# Patient Record
Sex: Male | Born: 1975 | Race: White | Hispanic: No | Marital: Single | Smoking: Former smoker
Health system: Southern US, Community
[De-identification: ages and names within clinical notes are randomized; demographics above are authoritative.]

## PROBLEM LIST (undated history)

## (undated) DIAGNOSIS — R0981 Nasal congestion: Secondary | ICD-10-CM

## (undated) DIAGNOSIS — R768 Other specified abnormal immunological findings in serum: Secondary | ICD-10-CM

## (undated) DIAGNOSIS — F1210 Cannabis abuse, uncomplicated: Secondary | ICD-10-CM

## (undated) DIAGNOSIS — F32A Depression, unspecified: Secondary | ICD-10-CM

## (undated) DIAGNOSIS — F329 Major depressive disorder, single episode, unspecified: Secondary | ICD-10-CM

## (undated) DIAGNOSIS — B1920 Unspecified viral hepatitis C without hepatic coma: Secondary | ICD-10-CM

## (undated) DIAGNOSIS — F1910 Other psychoactive substance abuse, uncomplicated: Secondary | ICD-10-CM

## (undated) DIAGNOSIS — F1510 Other stimulant abuse, uncomplicated: Secondary | ICD-10-CM

## (undated) DIAGNOSIS — F319 Bipolar disorder, unspecified: Secondary | ICD-10-CM

## (undated) DIAGNOSIS — F419 Anxiety disorder, unspecified: Secondary | ICD-10-CM

## (undated) HISTORY — PX: ANKLE SURGERY: SHX546

## (undated) HISTORY — PX: TENDON REPAIR: SHX5111

## (undated) HISTORY — PX: HERNIA REPAIR: SHX51

---

## 2006-04-14 ENCOUNTER — Emergency Department: Payer: Self-pay | Admitting: Emergency Medicine

## 2006-05-27 ENCOUNTER — Emergency Department: Payer: Self-pay | Admitting: Internal Medicine

## 2007-12-31 ENCOUNTER — Emergency Department: Payer: Self-pay | Admitting: Emergency Medicine

## 2008-01-08 ENCOUNTER — Emergency Department: Payer: Self-pay | Admitting: Emergency Medicine

## 2008-01-19 ENCOUNTER — Emergency Department (HOSPITAL_COMMUNITY): Admission: EM | Admit: 2008-01-19 | Discharge: 2008-01-19 | Payer: Self-pay | Admitting: Emergency Medicine

## 2008-02-06 ENCOUNTER — Emergency Department (HOSPITAL_COMMUNITY): Admission: EM | Admit: 2008-02-06 | Discharge: 2008-02-06 | Payer: Self-pay | Admitting: Emergency Medicine

## 2008-11-17 IMAGING — CR DG HAND COMPLETE 3+V*R*
3 series · 3 of 3 positions shown · non-contrast
Comparison: 01/19/2008

CLINICAL DATA: Status post tendon and nerve repair due to fracture.
Limited something heavy.  Now cannot feel finger.  Pain.

RIGHT HAND - COMPLETE 3+ VIEW

[x hand pa right]
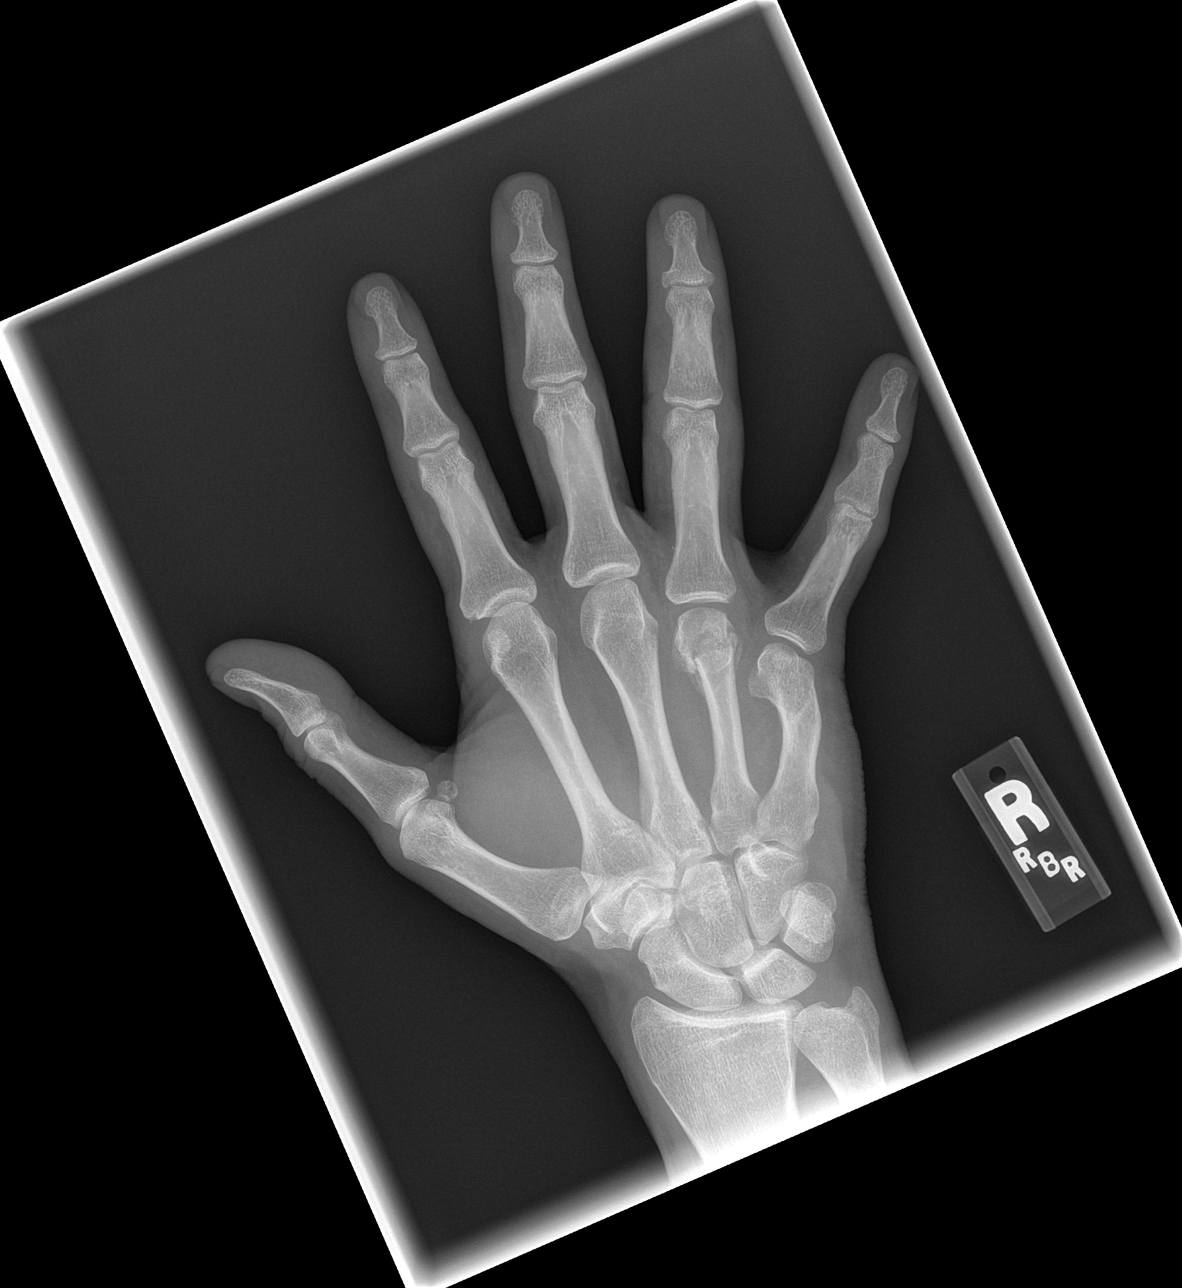

[x hand oblique right]
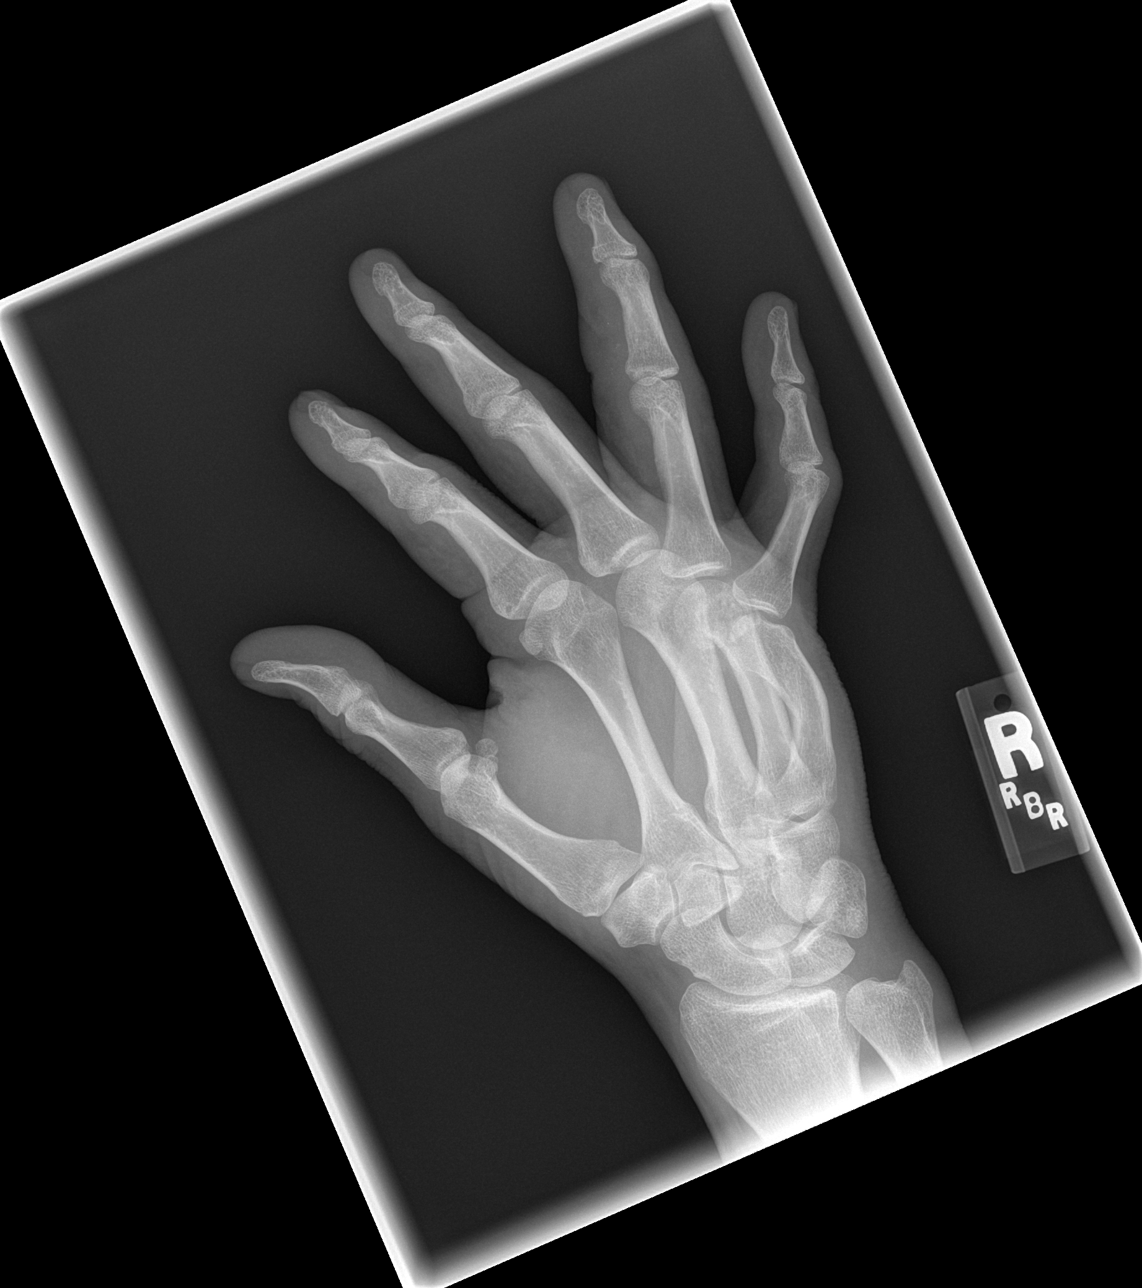

[x hand lat right]
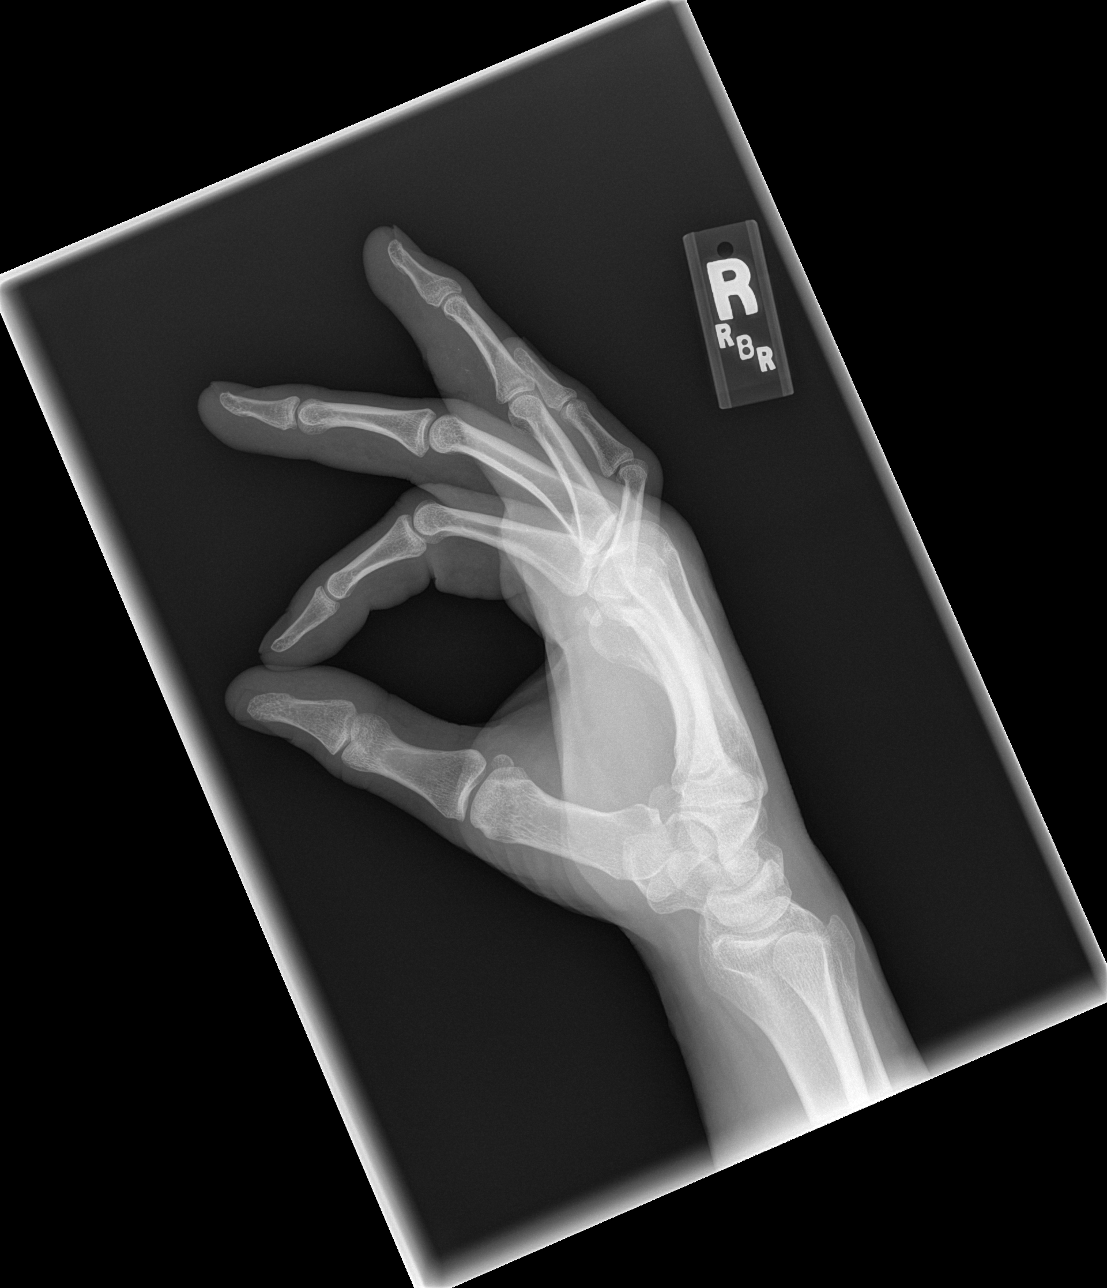

[3 of 3 positions shown; findings below may reference images not displayed]

FINDINGS: There is a healing fracture of the fourth metacarpal.
There has been no interval change in alignment.  Deformity of the
fifth metacarpal is consistent with old injury.  No new fractures
are identified.
IMPRESSION: No significant change in alignment of fourth metacarpal fracture.
Callus indicates interval healing.

## 2009-01-02 ENCOUNTER — Emergency Department: Payer: Self-pay | Admitting: Emergency Medicine

## 2009-11-07 ENCOUNTER — Emergency Department (HOSPITAL_COMMUNITY): Admission: EM | Admit: 2009-11-07 | Discharge: 2009-11-07 | Payer: Self-pay | Admitting: Emergency Medicine

## 2011-01-30 ENCOUNTER — Emergency Department (HOSPITAL_COMMUNITY)
Admission: EM | Admit: 2011-01-30 | Discharge: 2011-01-30 | Disposition: A | Discharge status: Home / Self Care | Payer: Self-pay | Attending: Emergency Medicine | Admitting: Emergency Medicine

## 2011-01-30 DIAGNOSIS — K029 Dental caries, unspecified: Secondary | ICD-10-CM | POA: Insufficient documentation

## 2011-01-30 DIAGNOSIS — R6889 Other general symptoms and signs: Secondary | ICD-10-CM | POA: Insufficient documentation

## 2011-01-30 DIAGNOSIS — J343 Hypertrophy of nasal turbinates: Secondary | ICD-10-CM | POA: Insufficient documentation

## 2011-01-30 DIAGNOSIS — R059 Cough, unspecified: Secondary | ICD-10-CM | POA: Insufficient documentation

## 2011-01-30 DIAGNOSIS — J069 Acute upper respiratory infection, unspecified: Secondary | ICD-10-CM | POA: Insufficient documentation

## 2011-01-30 DIAGNOSIS — K089 Disorder of teeth and supporting structures, unspecified: Secondary | ICD-10-CM | POA: Insufficient documentation

## 2011-01-30 DIAGNOSIS — R05 Cough: Secondary | ICD-10-CM | POA: Insufficient documentation

## 2011-01-30 DIAGNOSIS — J3489 Other specified disorders of nose and nasal sinuses: Secondary | ICD-10-CM | POA: Insufficient documentation

## 2011-03-03 ENCOUNTER — Emergency Department (HOSPITAL_COMMUNITY)
Admission: EM | Admit: 2011-03-03 | Discharge: 2011-03-03 | Disposition: A | Discharge status: Home / Self Care | Payer: Self-pay | Attending: Emergency Medicine | Admitting: Emergency Medicine

## 2011-03-03 DIAGNOSIS — K029 Dental caries, unspecified: Secondary | ICD-10-CM | POA: Insufficient documentation

## 2011-03-03 DIAGNOSIS — L2989 Other pruritus: Secondary | ICD-10-CM | POA: Insufficient documentation

## 2011-03-03 DIAGNOSIS — R300 Dysuria: Secondary | ICD-10-CM | POA: Insufficient documentation

## 2011-03-03 DIAGNOSIS — K089 Disorder of teeth and supporting structures, unspecified: Secondary | ICD-10-CM | POA: Insufficient documentation

## 2011-03-03 DIAGNOSIS — L298 Other pruritus: Secondary | ICD-10-CM | POA: Insufficient documentation

## 2011-03-16 ENCOUNTER — Emergency Department: Payer: Self-pay | Admitting: Emergency Medicine

## 2011-03-24 ENCOUNTER — Emergency Department: Payer: Self-pay | Admitting: Emergency Medicine

## 2011-05-12 ENCOUNTER — Emergency Department (HOSPITAL_COMMUNITY)
Admission: EM | Admit: 2011-05-12 | Discharge: 2011-05-12 | Disposition: A | Discharge status: Home / Self Care | Payer: Self-pay | Attending: Emergency Medicine | Admitting: Emergency Medicine

## 2011-05-12 DIAGNOSIS — M545 Low back pain, unspecified: Secondary | ICD-10-CM | POA: Insufficient documentation

## 2011-05-12 DIAGNOSIS — M542 Cervicalgia: Secondary | ICD-10-CM | POA: Insufficient documentation

## 2011-05-12 DIAGNOSIS — T148XXA Other injury of unspecified body region, initial encounter: Secondary | ICD-10-CM | POA: Insufficient documentation

## 2011-05-30 ENCOUNTER — Emergency Department: Payer: Self-pay | Admitting: Emergency Medicine

## 2011-06-19 ENCOUNTER — Emergency Department: Payer: Self-pay | Admitting: Emergency Medicine

## 2011-06-21 ENCOUNTER — Emergency Department: Payer: Self-pay | Admitting: Emergency Medicine

## 2011-10-01 ENCOUNTER — Encounter: Payer: Self-pay | Admitting: *Deleted

## 2011-10-01 ENCOUNTER — Emergency Department (HOSPITAL_COMMUNITY)
Admission: EM | Admit: 2011-10-01 | Discharge: 2011-10-01 | Disposition: A | Discharge status: Home / Self Care | Payer: Self-pay | Attending: Emergency Medicine | Admitting: Emergency Medicine

## 2011-10-01 ENCOUNTER — Emergency Department (HOSPITAL_COMMUNITY): Payer: Self-pay

## 2011-10-01 DIAGNOSIS — M25579 Pain in unspecified ankle and joints of unspecified foot: Secondary | ICD-10-CM | POA: Insufficient documentation

## 2011-10-01 DIAGNOSIS — W11XXXA Fall on and from ladder, initial encounter: Secondary | ICD-10-CM | POA: Insufficient documentation

## 2011-10-01 DIAGNOSIS — M79609 Pain in unspecified limb: Secondary | ICD-10-CM | POA: Insufficient documentation

## 2011-10-01 DIAGNOSIS — S6990XA Unspecified injury of unspecified wrist, hand and finger(s), initial encounter: Secondary | ICD-10-CM | POA: Insufficient documentation

## 2011-10-01 DIAGNOSIS — M79643 Pain in unspecified hand: Secondary | ICD-10-CM

## 2011-10-01 DIAGNOSIS — M7989 Other specified soft tissue disorders: Secondary | ICD-10-CM | POA: Insufficient documentation

## 2011-10-01 DIAGNOSIS — M25572 Pain in left ankle and joints of left foot: Secondary | ICD-10-CM

## 2011-10-01 MED ORDER — TRAMADOL HCL 50 MG PO TABS: 50.0000 mg | ORAL_TABLET | Freq: Four times a day (QID) | ORAL | Status: AC | PRN

## 2011-10-01 MED ORDER — MORPHINE SULFATE 2 MG/ML IJ SOLN
2.0000 mg | Freq: Once | INTRAMUSCULAR | Status: AC
  Administered 2011-10-01: 2 mg via INTRAMUSCULAR
  Filled 2011-10-01: qty 1

## 2011-10-01 MED ORDER — MORPHINE SULFATE 2 MG/ML IJ SOLN: 2.0000 mg | Freq: Once | INTRAMUSCULAR | Status: DC

## 2011-10-01 NOTE — ED Notes (Signed)
Pt reports he fell approx 6 feet off a ladder landing on his right side. Pt braced hisself with right hand. Pt with noted deformity to right hand. Pt denies any tenderness with palpation to c-spine or tls. Pt reports left ankle discomfort with tingling. Pt reports he is unable to flex or extend ankle. Pt denies hitting head.

## 2011-10-01 NOTE — ED Notes (Signed)
Pt fell off ladder today. Presents with swollen rt hand and left ankle. rom remains intact in affected extremities except pt is unable to actively flex rt foot/ankle. Also states numbness/tingling. Pt denies loc. Denies other injury.

## 2011-10-01 NOTE — ED Provider Notes (Signed)
History     CSN: [id number]  Arrival date & time 10/01/11  1418   First MD Initiated Contact with Patient 10/01/11 1716      Chief Complaint  Patient presents with  . Fall  . Hand Injury    (Consider location/radiation/quality/duration/timing/severity/associated sxs/prior treatment) Patient is a 35 y.o. male presenting with fall and hand injury. The history is provided by the patient.  Fall Pertinent negatives include no headaches.  Hand Injury    the patient is a 35 year old, male, who says that he fell off a ladder wall helping his mother.  Take down Christmas tree lights.  He struck his right hand and left ankle.  He complains of right hand pain and swelling over the mid carpal phalangeal joint of the long finger and index finger.  He also complains of left ankle pain.  He did not hit his head.  He has no pain anywhere else  History reviewed. No pertinent past medical history.  Past Surgical History  Procedure Date  . Tendon repair   . Ankle surgery   . Hernia repair     History reviewed. No pertinent family history.  History  Substance Use Topics  . Smoking status: Current Everyday Smoker  . Smokeless tobacco: Not on file  . Alcohol Use: No      Review of Systems  Musculoskeletal:       Right hand pain Left ankle pain  Neurological: Negative for headaches.  Hematological: Does not bruise/bleed easily.  Psychiatric/Behavioral: Negative for confusion.    Allergies  Review of patient's allergies indicates no known allergies.  Home Medications  No current outpatient prescriptions on file.  BP 134/81  Pulse 86  Temp(Src) 98.1 F (36.7 C) (Oral)  Resp 18  SpO2 97%  Physical Exam  Constitutional: He is oriented to person, place, and time. He appears well-developed and well-nourished. No distress.  HENT:  Head: Normocephalic and atraumatic.  Eyes: Conjunctivae are normal. Pupils are equal, round, and reactive to light.  Neck: Normal range of motion.    Musculoskeletal: He exhibits edema and tenderness.       Right hand Swelling and tenderness over the metacarpal phalangeal joints of the index and long fingers.  No deformities.  No ecchymoses.  No evidence of tendon injuries.  No lacerations Left ankle.  Mild tenderness to palpation over the medial and lateral malleoli.  No swelling.  No deformity.  No ecchymoses  Neurological: He is alert and oriented to person, place, and time.  Skin: Skin is warm and dry.  Psychiatric: He has a normal mood and affect.    ED Course  Procedures (including critical care time) 35 year old, male, with pain and tenderness in his right hand and left ankle after a fall off a ladder.  We will perform x-rays, and give analgesics.  Labs Reviewed - No data to display Dg Ankle Complete Left  10/01/2011  *RADIOLOGY REPORT*  Clinical Data: Fall.  Pain over the medial malleolus.  LEFT ANKLE COMPLETE - 3+ VIEW  Comparison: None.  Findings: The ankle joint is intact.  No acute bone or soft tissue abnormality is present.  No radiopaque foreign body is seen.  IMPRESSION: Negative left ankle.  Original Report Authenticated By: Jamesetta Orleans. MATTERN, M.D.   Dg Hand Complete Right  10/01/2011  *RADIOLOGY REPORT*  Clinical Data: 35 year old male with right hand pain following injury.  RIGHT HAND - COMPLETE 3+ VIEW  Comparison: 08/08/2011  Findings: There is no evidence of acute fracture, subluxation  or dislocation. There is no evidence of radiopaque foreign body. Remote fractures of the fourth and fifth metacarpals are again identified. No focal bony lesions are noted.  IMPRESSION: No acute abnormalities.  Original Report Authenticated By: Rosendo Gros, M.D.     No diagnosis found.    MDM  Right hand pain after fall.  No fracture or dislocation.  No lacerations. Left ankle pain after fall.  No fracture or dislocation.        Nicholes Stairs, MD 10/01/11 1755

## 2012-04-01 IMAGING — CR LEFT WRIST - COMPLETE 3+ VIEW
1 series · 4 of 4 positions shown · non-contrast
Comparison: none

REASON FOR EXAM: tramatic injury, pain
COMMENTS:   May transport without cardiac monitor

PROCEDURE:     DXR - DXR WRIST LT COMP WITH OBLIQUES  - June 21, 2011 [DATE]
RESULT:     There is no evidence of fracture, dislocation, or malalignment.

[Series 1: view not recorded · 0.17mm/px · 4 of 4 slices shown]
[im 1/4]
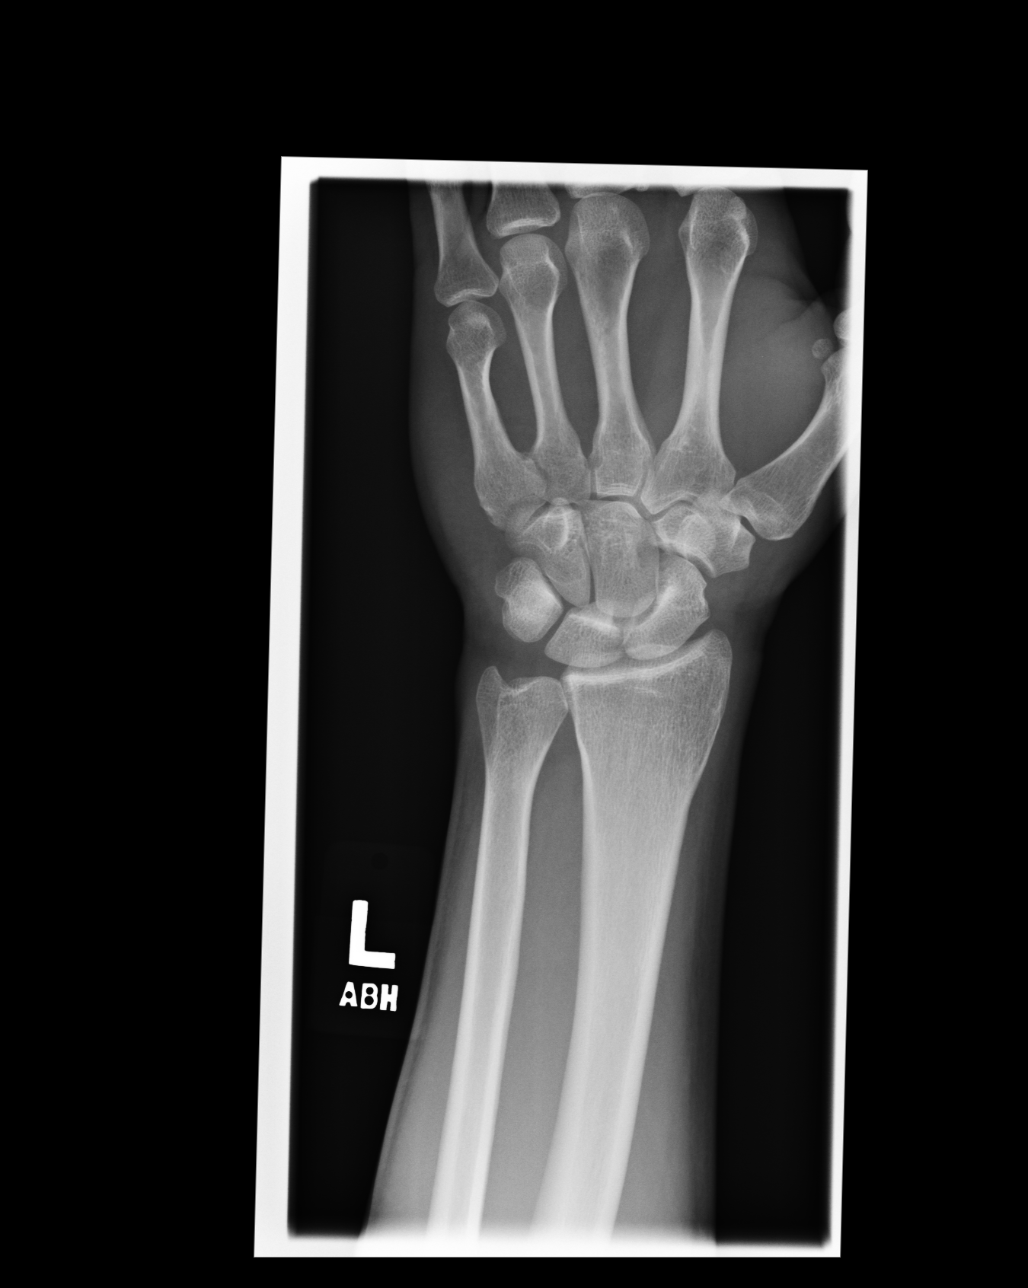
[im 2/4]
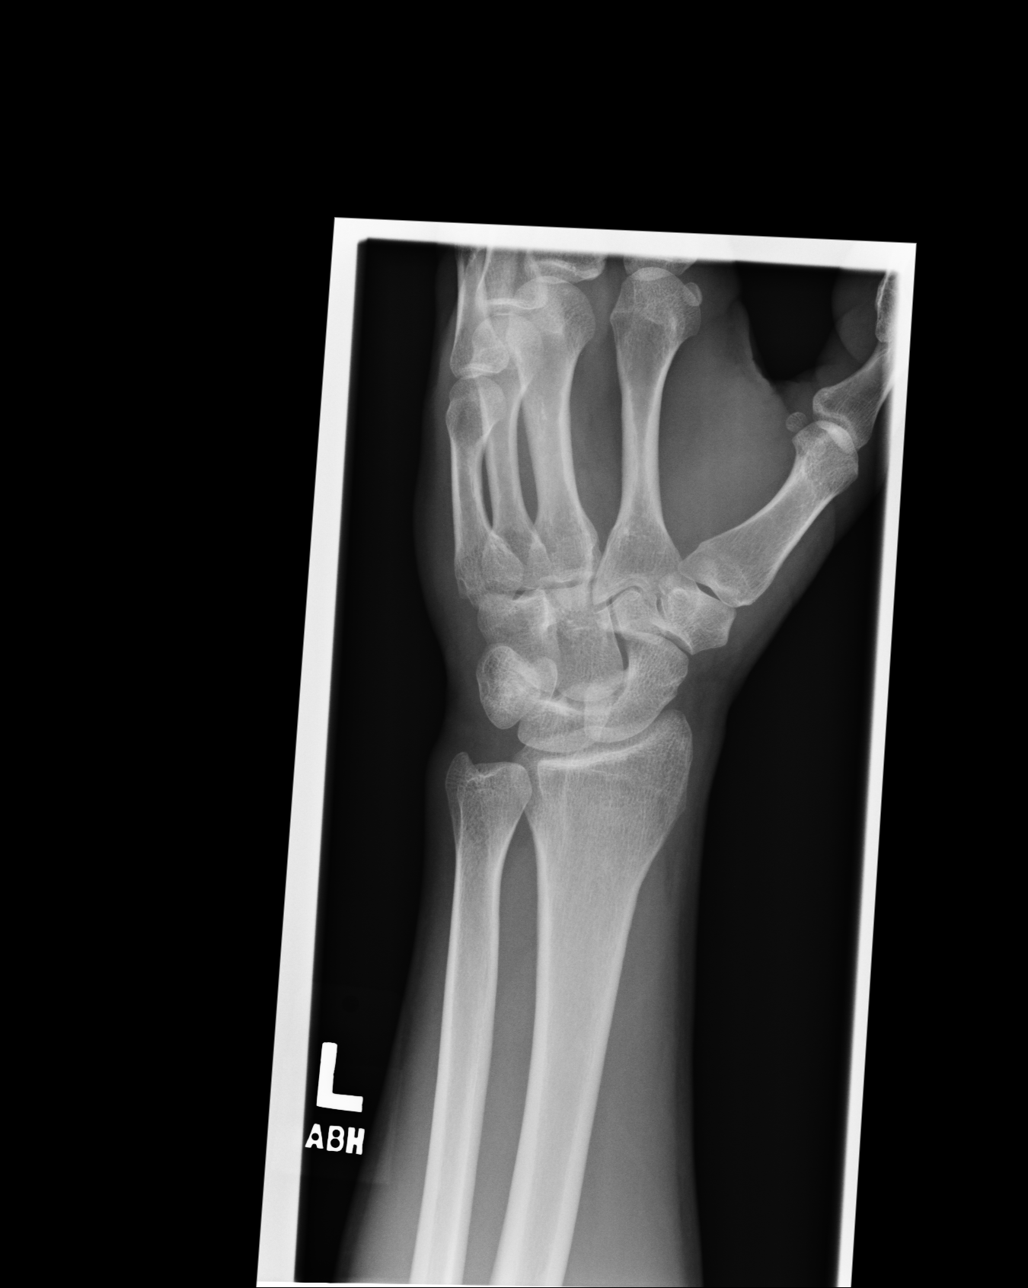
[im 3/4]
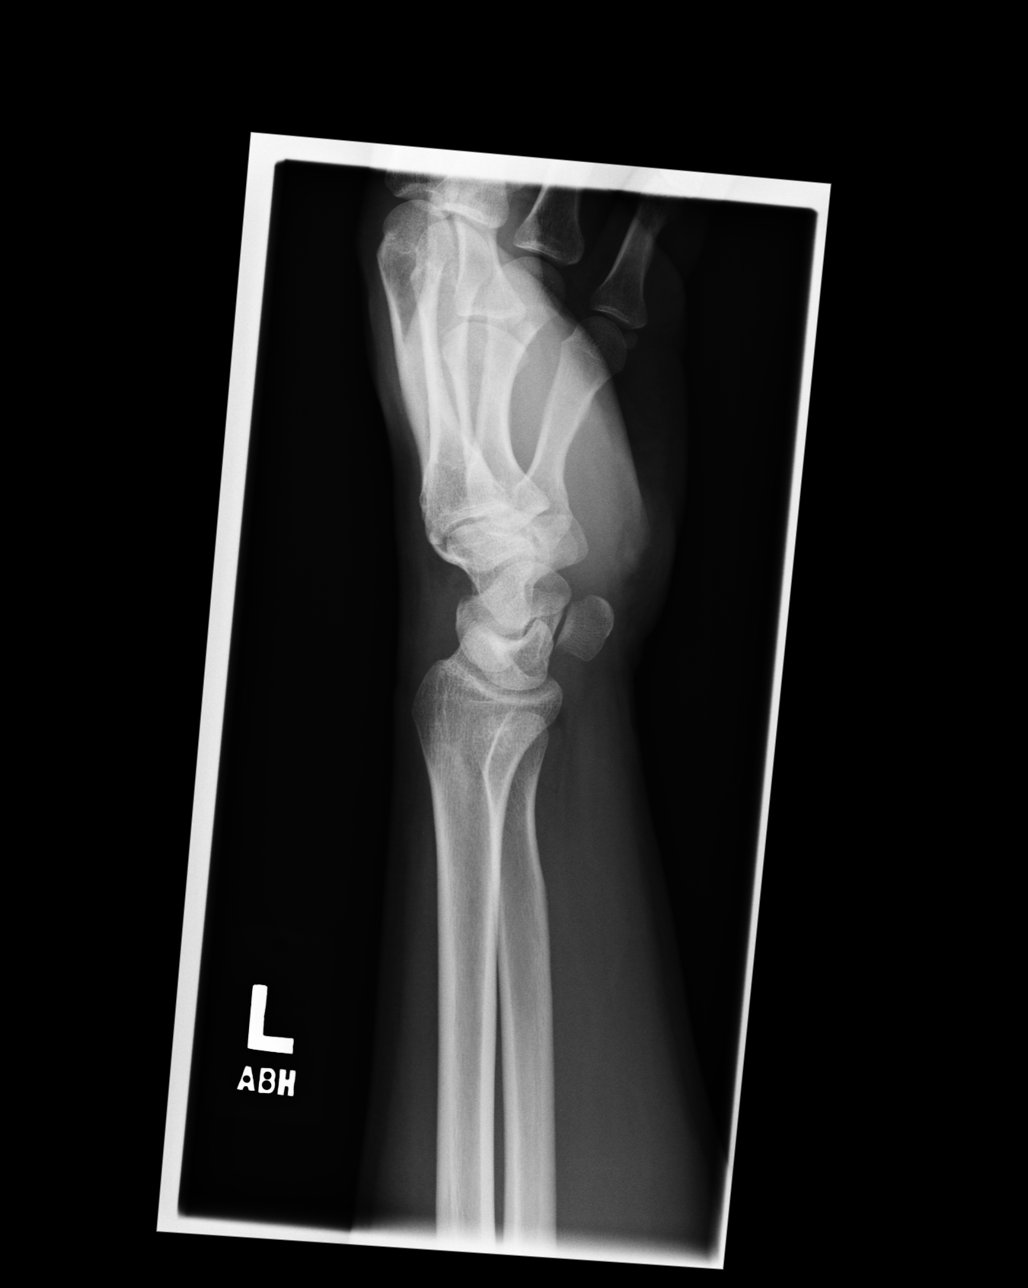
[im 4/4]
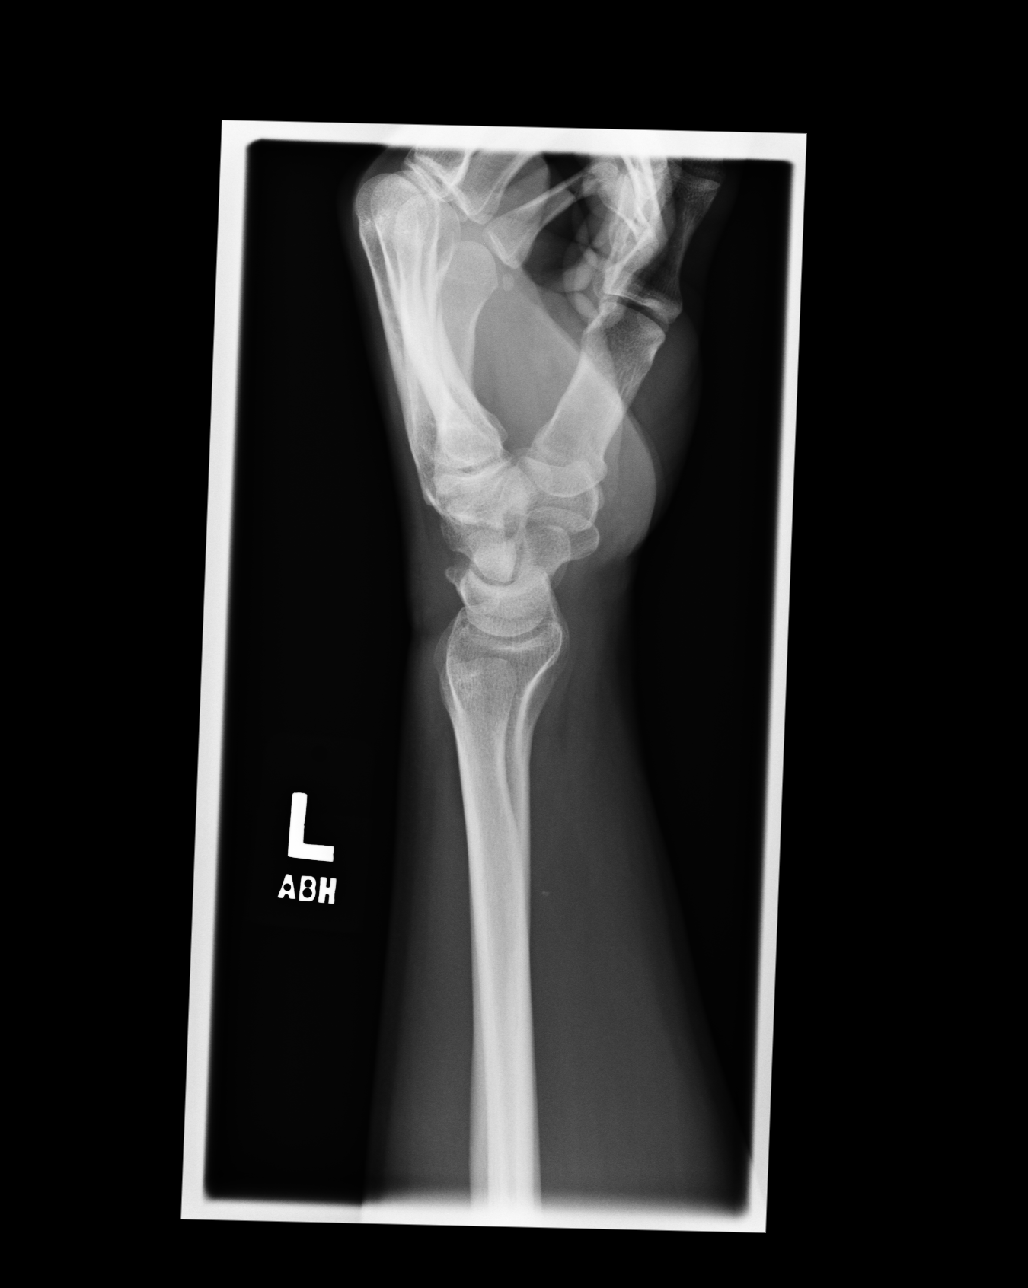

[4 of 4 positions shown; findings below may reference images not displayed]

IMPRESSION: 1. No evidence of acute abnormalities.
2. If there are persistent complaints of pain or persistent clinical
concern, a repeat evaluation in 7-10 days is recommended if clinically
warranted.

## 2012-06-08 ENCOUNTER — Emergency Department (HOSPITAL_COMMUNITY)
Admission: EM | Admit: 2012-06-08 | Discharge: 2012-06-08 | Disposition: A | Discharge status: Home / Self Care | Payer: Self-pay | Attending: Emergency Medicine | Admitting: Emergency Medicine

## 2012-06-08 ENCOUNTER — Emergency Department (HOSPITAL_COMMUNITY): Payer: Self-pay

## 2012-06-08 ENCOUNTER — Encounter (HOSPITAL_COMMUNITY): Payer: Self-pay | Admitting: *Deleted

## 2012-06-08 DIAGNOSIS — F17200 Nicotine dependence, unspecified, uncomplicated: Secondary | ICD-10-CM | POA: Insufficient documentation

## 2012-06-08 DIAGNOSIS — M79609 Pain in unspecified limb: Secondary | ICD-10-CM | POA: Insufficient documentation

## 2012-06-08 DIAGNOSIS — S60229A Contusion of unspecified hand, initial encounter: Secondary | ICD-10-CM | POA: Insufficient documentation

## 2012-06-08 DIAGNOSIS — M7989 Other specified soft tissue disorders: Secondary | ICD-10-CM | POA: Insufficient documentation

## 2012-06-08 MED ORDER — IBUPROFEN 600 MG PO TABS: 600.0000 mg | ORAL_TABLET | Freq: Four times a day (QID) | ORAL | Status: AC | PRN

## 2012-06-08 MED ORDER — NAPROXEN 500 MG PO TABS: 500.0000 mg | ORAL_TABLET | Freq: Two times a day (BID) | ORAL | Status: DC

## 2012-06-08 MED ORDER — IBUPROFEN 400 MG PO TABS
800.0000 mg | ORAL_TABLET | Freq: Once | ORAL | Status: AC
  Administered 2012-06-08: 800 mg via ORAL
  Filled 2012-06-08: qty 2

## 2012-06-08 NOTE — Progress Notes (Signed)
Orthopedic Tech Progress Note Patient Details:  [name] Jun 06, 1976 [id number]  Ortho Devices Type of Ortho Device: Volar splint Ortho Device/Splint Location: (R) UE Ortho Device/Splint Interventions: Application   Jennye Moccasin 06/08/2012, 2:27 PM
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
## 2012-06-08 NOTE — ED Notes (Signed)
NAD upon discharge home. Splint instructions reviewed, medications reviewed. Pt verbalizes understanding of instructions.
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
## 2013-06-09 ENCOUNTER — Emergency Department: Payer: Self-pay | Admitting: Emergency Medicine

## 2016-08-15 ENCOUNTER — Encounter: Payer: Self-pay | Admitting: Emergency Medicine

## 2016-08-15 ENCOUNTER — Emergency Department
Admission: EM | Admit: 2016-08-15 | Discharge: 2016-08-15 | Disposition: A | Discharge status: Home / Self Care | Payer: Self-pay | Attending: Emergency Medicine | Admitting: Emergency Medicine

## 2016-08-15 ENCOUNTER — Emergency Department: Payer: Self-pay

## 2016-08-15 DIAGNOSIS — F17210 Nicotine dependence, cigarettes, uncomplicated: Secondary | ICD-10-CM | POA: Insufficient documentation

## 2016-08-15 DIAGNOSIS — F1910 Other psychoactive substance abuse, uncomplicated: Secondary | ICD-10-CM | POA: Insufficient documentation

## 2016-08-15 HISTORY — DX: Nasal congestion: R09.81

## 2016-08-15 LAB — BASIC METABOLIC PANEL
Anion gap: 7 (ref 5–15)
BUN: 8 mg/dL (ref 6–20)
CHLORIDE: 107 mmol/L (ref 101–111)
CO2: 23 mmol/L (ref 22–32)
Calcium: 9 mg/dL (ref 8.9–10.3)
Creatinine, Ser: 0.87 mg/dL (ref 0.61–1.24)
GFR calc Af Amer: >60 mL/min (ref >60)
GLUCOSE: 113 mg/dL — AB (ref 65–99)
POTASSIUM: 3.8 mmol/L (ref 3.5–5.1)
Sodium: 137 mmol/L (ref 135–145)

## 2016-08-15 LAB — CBC
HEMATOCRIT: 47.3 % (ref 40.0–52.0)
Hemoglobin: 16.1 g/dL (ref 13.0–18.0)
MCH: 28.9 pg (ref 26.0–34.0)
MCHC: 34 g/dL (ref 32.0–36.0)
MCV: 85.2 fL (ref 80.0–100.0)
Platelets: 263 10*3/uL (ref 150–440)
RBC: 5.55 MIL/uL (ref 4.40–5.90)
RDW: 13.7 % (ref 11.5–14.5)
WBC: 8.1 10*3/uL (ref 3.8–10.6)

## 2016-08-15 LAB — TROPONIN I: Troponin I: <0.03 ng/mL (ref <0.03)

## 2016-08-15 MED ORDER — IPRATROPIUM-ALBUTEROL 0.5-2.5 (3) MG/3ML IN SOLN: 3.0000 mL | Freq: Once | RESPIRATORY_TRACT | Status: DC

## 2016-08-15 MED ORDER — IPRATROPIUM-ALBUTEROL 0.5-2.5 (3) MG/3ML IN SOLN
3.0000 mL | Freq: Once | RESPIRATORY_TRACT | Status: AC
  Administered 2016-08-15: 3 mL via RESPIRATORY_TRACT
  Filled 2016-08-15: qty 3

## 2016-08-15 NOTE — ED Triage Notes (Addendum)
Patient presents to the ED stating, "I want to detox from suboxone strips."  Patient states he called [facility] and was told he needed medical clearance for detox because patient reports anxiety and chest pain when he is detoxing from the suboxone.  Patient states, "I normally take a xanax and my chest pain goes away but I'm trying to get off everything."  Patient last used suboxone today.

## 2016-08-15 NOTE — ED Provider Notes (Signed)
[facility] Emergency Department Provider Note   ____________________________________________    I have reviewed the triage vital signs and the nursing notes.   HISTORY  Chief Complaint Medical Clearance     HPI [name] is a 40 y.o. male who presents for medical clearance. Patient is attempting to detox from Suboxone he also admits taking Xanax. He complains currently of a mild upper respiratory infection but denies difficulty breathing or chest pain. No fevers or chills reported. He does smoke cigarettes.   Past Medical History:  Diagnosis Date  . Sinus congestion     There are no active problems to display for this patient.   Past Surgical History:  Procedure Laterality Date  . ANKLE SURGERY    . HERNIA REPAIR    . TENDON REPAIR      Prior to Admission medications   Not on File     Allergies Patient has no known allergies.  No family history on file.  Social History Social History  Substance Use Topics  . Smoking status: Current Every Day Smoker    Packs/day: 1.00    Types: Cigarettes  . Smokeless tobacco: Never Used  . Alcohol use No    Review of Systems  Constitutional: No fever/chills Eyes: No visual changes.  ENT: No sore throat. Cardiovascular: Denies chest pain. Respiratory: Denies shortness of breath.Occasional cough Gastrointestinal: No abdominal pain.  No nausea, no vomiting.    Musculoskeletal: Negative for back pain. Skin: Negative for rash. Neurological: Negative for headaches or weakness    ____________________________________________   PHYSICAL EXAM:  VITAL SIGNS: ED Triage Vitals     Enc Vitals Group     BP (!) 136/93     Pulse      Resp 16     Temp 98.1 F (36.7 C)     Temp Source Oral     SpO2 99 %     Weight 210 lb (95.3 kg)     Height 6\' 1"  (1.854 m)     Head Circumference      Peak Flow      Pain Score 6     Pain Loc      Pain Edu?      Excl. in GC?      Constitutional: Alert and oriented. No acute distress. Pleasant and interactive Eyes: Conjunctivae are normal.  Head: Atraumatic. Nose: No congestion/rhinnorhea. Mouth/Throat: Mucous membranes are moist.   Neck:  Painless ROM Cardiovascular: Normal rate, regular rhythm. Grossly normal heart sounds.  Good peripheral circulation. Respiratory: Normal respiratory effort.  No retractions. Lungs CTAB. Gastrointestinal: Soft and nontender. No distention.  No CVA tenderness. Genitourinary: deferred Musculoskeletal: No lower extremity tenderness nor edema.  Warm and well perfused Neurologic:  Normal speech and language. No gross focal neurologic deficits are appreciated.  Skin:  Skin is warm, dry and intact. No rash noted. Psychiatric: Mood and affect are normal. Speech and behavior are normal.  ____________________________________________   LABS (all labs ordered are listed, but only abnormal results are displayed)  Labs Reviewed  BASIC METABOLIC PANEL - Abnormal; Notable for the following:       Result Value   Glucose, Bld 113 (*)    All other components within normal limits  CBC  TROPONIN I   ____________________________________________  EKG  ED ECG REPORT I, Jene EveryKINNER, Riggins Cisek, the attending physician, personally viewed and interpreted this ECG.  Date: 08/15/2016 EKG Time: 5:37 PM Rate: 86 Rhythm: normal sinus rhythm QRS Axis: normal  Intervals: normal ST/T Wave abnormalities: normal Conduction Disturbances: none   ____________________________________________  RADIOLOGY  Chest x-ray unremarkable ____________________________________________   PROCEDURES  Procedure(s) performed: No    Critical Care performed: No ____________________________________________   INITIAL IMPRESSION / ASSESSMENT AND PLAN / ED COURSE  Pertinent labs & imaging results that were available during my care of the patient were reviewed by me and considered in my medical decision making  (see chart for details).  Patient well-appearing and in no distress. His exam is unremarkable, labwork is benign. He is medically cleared for detox program  Clinical Course    ____________________________________________   FINAL CLINICAL IMPRESSION(S) / ED DIAGNOSES  Final diagnoses:  Polysubstance abuse      NEW MEDICATIONS STARTED DURING THIS VISIT:  New Prescriptions   No medications on file     Note:  This document was prepared using Dragon voice recognition software and may include unintentional dictation errors.    Jene Everyobert Victorio Creeden, MD 08/15/16 2016

## 2016-08-15 NOTE — ED Notes (Signed)
Pt states he is here seeking help withdrawling from suboxone. Pt states his last suboxone was at 1400 today. Pt states he has attempted to withdrawal on his own but "it makes me real anxious, like I can't breathe when I do it and it makes me feel so bad." pt denies SI or HI. Pt texting on phone in no acute distress. Pt denies nausea, vomiting or diarrhea and reports mild anxiety at this time.

## 2016-10-14 ENCOUNTER — Emergency Department
Admission: EM | Admit: 2016-10-14 | Discharge: 2016-10-15 | Disposition: A | Discharge status: Other Acute Inpatient Hospital | Payer: Self-pay | Attending: Emergency Medicine | Admitting: Emergency Medicine

## 2016-10-14 ENCOUNTER — Encounter: Payer: Self-pay | Admitting: Emergency Medicine

## 2016-10-14 DIAGNOSIS — Z5181 Encounter for therapeutic drug level monitoring: Secondary | ICD-10-CM | POA: Insufficient documentation

## 2016-10-14 DIAGNOSIS — R45851 Suicidal ideations: Secondary | ICD-10-CM

## 2016-10-14 DIAGNOSIS — F1220 Cannabis dependence, uncomplicated: Secondary | ICD-10-CM

## 2016-10-14 DIAGNOSIS — F17210 Nicotine dependence, cigarettes, uncomplicated: Secondary | ICD-10-CM | POA: Insufficient documentation

## 2016-10-14 DIAGNOSIS — F3181 Bipolar II disorder: Secondary | ICD-10-CM | POA: Insufficient documentation

## 2016-10-14 HISTORY — DX: Major depressive disorder, single episode, unspecified: F32.9

## 2016-10-14 HISTORY — DX: Depression, unspecified: F32.A

## 2016-10-14 LAB — CBC WITH DIFFERENTIAL/PLATELET
BASOS PCT: 2 %
Basophils Absolute: 0.1 10*3/uL (ref 0–0.1)
Eosinophils Absolute: 0.2 10*3/uL (ref 0–0.7)
Eosinophils Relative: 3 %
HEMATOCRIT: 45.6 % (ref 40.0–52.0)
HEMOGLOBIN: 15.9 g/dL (ref 13.0–18.0)
LYMPHS ABS: 2.7 10*3/uL (ref 1.0–3.6)
LYMPHS PCT: 33 %
MCH: 30.3 pg (ref 26.0–34.0)
MCHC: 34.9 g/dL (ref 32.0–36.0)
MCV: 87 fL (ref 80.0–100.0)
MONO ABS: 0.6 10*3/uL (ref 0.2–1.0)
MONOS PCT: 8 %
NEUTROS ABS: 4.6 10*3/uL (ref 1.4–6.5)
Neutrophils Relative %: 56 %
Platelets: 342 10*3/uL (ref 150–440)
RBC: 5.24 MIL/uL (ref 4.40–5.90)
RDW: 15.2 % — AB (ref 11.5–14.5)
WBC: 8.2 10*3/uL (ref 3.8–10.6)

## 2016-10-14 LAB — COMPREHENSIVE METABOLIC PANEL
ALK PHOS: 109 U/L (ref 38–126)
ALT: 121 U/L — ABNORMAL HIGH (ref 17–63)
ANION GAP: 10 (ref 5–15)
AST: 39 U/L (ref 15–41)
Albumin: 4.4 g/dL (ref 3.5–5.0)
BILIRUBIN TOTAL: 0.3 mg/dL (ref 0.3–1.2)
BUN: 8 mg/dL (ref 6–20)
CALCIUM: 9.5 mg/dL (ref 8.9–10.3)
CO2: 25 mmol/L (ref 22–32)
Chloride: 104 mmol/L (ref 101–111)
Creatinine, Ser: 0.73 mg/dL (ref 0.61–1.24)
Glucose, Bld: 111 mg/dL — ABNORMAL HIGH (ref 65–99)
Potassium: 3.5 mmol/L (ref 3.5–5.1)
Sodium: 139 mmol/L (ref 135–145)
TOTAL PROTEIN: 7.7 g/dL (ref 6.5–8.1)

## 2016-10-14 LAB — URINE DRUG SCREEN, QUALITATIVE (ARMC ONLY)
AMPHETAMINES, UR SCREEN: NOT DETECTED
BARBITURATES, UR SCREEN: NOT DETECTED
BENZODIAZEPINE, UR SCRN: POSITIVE — AB
Cannabinoid 50 Ng, Ur ~~LOC~~: POSITIVE — AB
Cocaine Metabolite,Ur ~~LOC~~: NOT DETECTED
MDMA (Ecstasy)Ur Screen: NOT DETECTED
METHADONE SCREEN, URINE: NOT DETECTED
Opiate, Ur Screen: NOT DETECTED
Phencyclidine (PCP) Ur S: NOT DETECTED
Tricyclic, Ur Screen: NOT DETECTED

## 2016-10-14 LAB — URINALYSIS, COMPLETE (UACMP) WITH MICROSCOPIC
Bacteria, UA: NONE SEEN
Bilirubin Urine: NEGATIVE
GLUCOSE, UA: NEGATIVE mg/dL
Hgb urine dipstick: NEGATIVE
KETONES UR: NEGATIVE mg/dL
LEUKOCYTES UA: NEGATIVE
Nitrite: NEGATIVE
PH: 6 (ref 5.0–8.0)
Protein, ur: NEGATIVE mg/dL
RBC / HPF: NONE SEEN RBC/hpf (ref 0–5)
SPECIFIC GRAVITY, URINE: 1.017 (ref 1.005–1.030)
SQUAMOUS EPITHELIAL / LPF: NONE SEEN

## 2016-10-14 LAB — ACETAMINOPHEN LEVEL: Acetaminophen (Tylenol), Serum: <10 ug/mL — ABNORMAL LOW (ref 10–30)

## 2016-10-14 LAB — SALICYLATE LEVEL

## 2016-10-14 LAB — ETHANOL

## 2016-10-14 MED ORDER — QUETIAPINE FUMARATE ER 50 MG PO TB24
50.0000 mg | ORAL_TABLET | Freq: Every day | ORAL | Status: DC
  Administered 2016-10-14 – 2016-10-15 (×2): 50 mg via ORAL
  Filled 2016-10-14 (×2): qty 1

## 2016-10-14 MED ORDER — LORAZEPAM 1 MG PO TABS
1.0000 mg | ORAL_TABLET | Freq: Four times a day (QID) | ORAL | Status: DC | PRN
  Administered 2016-10-14 – 2016-10-15 (×2): 1 mg via ORAL
  Filled 2016-10-14 (×2): qty 1

## 2016-10-14 NOTE — ED Notes (Signed)
Pt states that he has a long psychiatric history and bipolar disorder. Pt reports noncompliance with medications because he does not like the side effects or the way they make him feel. Pt is alert and oriented at this time. He endorses passive SI/HI r/t AH but does contract for safety. Pt is anxious but pleasant and cooperative with staff. Writer oriented pt to the BHU, provided nutrition and 15 minute checks are ongoing for safety.

## 2016-10-14 NOTE — ED Triage Notes (Signed)
Reports being at [facility] today and told them he was suicidal.  Denies plan.

## 2016-10-14 NOTE — ED Provider Notes (Addendum)
[facility] Emergency Department Provider Note  ____________________________________________   I have reviewed the triage vital signs and the nursing notes.   HISTORY  Chief Complaint Depression    HPI [name] is a 41 y.o. male who does not have a history of significant alcohol abuse he states but does have a history of bipolar disorder, he states he is hearing voices telling him to kill himself and kill other people. He has not acted upon this. He has not taken an overdose or harm himself. He is worried for his safety and the safety of others. That is the reason he presents here today. He is under IVC.   He denies any other acute complaints he states in the past surgical well helped him feel better   Past Medical History:  Diagnosis Date  . Depression   . Sinus congestion     There are no active problems to display for this patient.   Past Surgical History:  Procedure Laterality Date  . ANKLE SURGERY    . HERNIA REPAIR    . HERNIA REPAIR    . TENDON REPAIR      Prior to Admission medications   Not on File    Allergies Patient has no known allergies.  No family history on file.  Social History Social History  Substance Use Topics  . Smoking status: Current Every Day Smoker    Packs/day: 1.00    Types: Cigarettes  . Smokeless tobacco: Never Used  . Alcohol use No    Review of Systems Constitutional: No fever/chills Eyes: No visual changes. ENT: No sore throat. No stiff neck no neck pain Cardiovascular: Denies chest pain. Respiratory: Denies shortness of breath. Gastrointestinal:   no vomiting.  No diarrhea.  No constipation. Genitourinary: Negative for dysuria. Musculoskeletal: Negative lower extremity swelling Skin: Negative for rash. Neurological: Negative for severe headaches, focal weakness or numbness. 10-point ROS otherwise negative.  ____________________________________________   PHYSICAL EXAM:  VITAL  SIGNS: ED Triage Vitals  Enc Vitals Group     BP 10/14/16 1724 132/74     Pulse Rate 10/14/16 1724 87     Resp 10/14/16 1724 18     Temp 10/14/16 1724 98.7 F (37.1 C)     Temp Source 10/14/16 1724 Oral     SpO2 10/14/16 1724 100 %     Weight 10/14/16 1725 193 lb (87.5 kg)     Height 10/14/16 1725 6\' 1"  (1.854 m)     Head Circumference --      Peak Flow --      Pain Score --      Pain Loc --      Pain Edu? --      Excl. in GC? --     Constitutional: Alert and oriented. Well appearing and in no acute distress. Eyes: Conjunctivae are normal. PERRL. EOMI. Head: Atraumatic. Nose: No congestion/rhinnorhea. Mouth/Throat: Mucous membranes are moist.  Oropharynx non-erythematous. Neck: No stridor.   Nontender with no meningismus Cardiovascular: Normal rate, regular rhythm. Grossly normal heart sounds.  Good peripheral circulation. Respiratory: Normal respiratory effort.  No retractions. Lungs CTAB. Abdominal: Soft and nontender. No distention. No guarding no rebound Back:  There is no focal tenderness or step off.  there is no midline tenderness there are no lesions noted. there is no CVA tenderness Musculoskeletal: No lower extremity tenderness, no upper extremity tenderness. No joint effusions, no DVT signs strong distal pulses no edema Neurologic:  Normal speech and language. No  gross focal neurologic deficits are appreciated.  Skin:  Skin is warm, dry and intact. No rash noted. Psychiatric: Mood and affect are Nonthreatening at this time. Speech and behavior are normal.  ____________________________________________   LABS (all labs ordered are listed, but only abnormal results are displayed)  Labs Reviewed  CBC WITH DIFFERENTIAL/PLATELET - Abnormal; Notable for the following:       Result Value   RDW 15.2 (*)    All other components within normal limits  COMPREHENSIVE METABOLIC PANEL - Abnormal; Notable for the following:    Glucose, Bld 111 (*)    ALT 121 (*)    All other  components within normal limits  URINE DRUG SCREEN, QUALITATIVE ([facility] ONLY) - Abnormal; Notable for the following:    Cannabinoid 50 Ng, Ur [place] POSITIVE (*)    Benzodiazepine, Ur Scrn POSITIVE (*)    All other components within normal limits  URINALYSIS, COMPLETE (UACMP) WITH MICROSCOPIC - Abnormal; Notable for the following:    Color, Urine YELLOW (*)    APPearance CLEAR (*)    All other components within normal limits  ACETAMINOPHEN LEVEL - Abnormal; Notable for the following:    Acetaminophen (Tylenol), Serum <10 (*)    All other components within normal limits  ETHANOL  SALICYLATE LEVEL   ____________________________________________  EKG  I personally interpreted any EKGs ordered by me or triage ____________________________________________  RADIOLOGY  I reviewed any imaging ordered by me or triage that were performed during my shift and, if possible, patient and/or family made aware of any abnormal findings. ____________________________________________   PROCEDURES  Procedure(s) performed: None  Procedures  Critical Care performed: None  ____________________________________________   INITIAL IMPRESSION / ASSESSMENT AND PLAN / ED COURSE  Pertinent labs & imaging results that were available during my care of the patient were reviewed by me and considered in my medical decision making (see chart for details).  Patient here for psychiatric clearance with thoughts of hurting self or others. We'll start him on cervical. We'll consult psychiatry and reassess. He is under involuntary commitment which well up hold. No acute medical issues noted at this time  Clinical Course    ____________________________________________   FINAL CLINICAL IMPRESSION(S) / ED DIAGNOSES  Final diagnoses:  None      This chart was dictated using voice recognition software.  Despite best efforts to proofread,  errors can occur which can change meaning.      Jeanmarie Plant,  MD 10/14/16 2202    Jeanmarie Plant, MD 10/19/16 [phone]

## 2016-10-14 NOTE — BH Assessment (Signed)
Assessment Note  [name] is an 41 y.o. male presenting to the ED voluntarily with concerns of worsening depression and thoughts of suicide by hanging himself.   Patient reports auditory hallucinations telling him to kill himself or hurt other people.  Pt reports he would not act on these voices, especially HI. Pt states that he has numerous legal charges because of aggressive behavior.  Patient reports being seen at [facility] 3 months for but did not feel like he got anything from being there.  Pt denies drug/alcohol use although testing positive for cannabis and benzos.    Diagnosis: Depression  Past Medical History:  Past Medical History:  Diagnosis Date  . Depression   . Sinus congestion     Past Surgical History:  Procedure Laterality Date  . ANKLE SURGERY    . HERNIA REPAIR    . HERNIA REPAIR    . TENDON REPAIR      Family History: No family history on file.  Social History:  reports that he has been smoking Cigarettes.  He has been smoking about 1.00 pack per day. He has never used smokeless tobacco. He reports that he does not drink alcohol. His drug history is not on file.  Additional Social History:  Alcohol / Drug Use Pain Medications: See PTA Prescriptions: See PTA Over the Counter: See PTA History of alcohol / drug use?: No history of alcohol / drug abuse (Pt denies drug/alcohol use)  CIWA: CIWA-Ar BP: 132/74 Pulse Rate: 87 COWS:    Allergies: No Known Allergies  Home Medications:  (Not in a hospital admission)  OB/GYN Status:  No LMP for male patient.  General Assessment Data Location of Assessment: [facility] ED TTS Assessment: In system Is this a Tele or Face-to-Face Assessment?: Face-to-Face Is this an Initial Assessment or a Re-assessment for this encounter?: Initial Assessment Marital status: Single Maiden name: n/a Is patient pregnant?: No Pregnancy Status: No Living Arrangements: Alone Can pt return to current living arrangement?: Yes Admission  Status: Voluntary Is patient capable of signing voluntary admission?: Yes Referral Source: Self/Family/Friend Insurance type: None  Medical Screening Exam [facility] Walk-in ONLY) Medical Exam completed: Yes  Crisis Care Plan Living Arrangements: Alone Legal Guardian: Other: (self) [facility] of Psych[facility]rist: [facility] of Therapist: [facility]  Education Status Is patient currently in school?: No Current Grade: na Highest grade of school patient has completed: na Name of school: na Contact person: na  Risk to self with the past 6 months Suicidal Ideation: Yes-Currently Present Has patient been a risk to self within the past 6 months prior to admission? : No Suicidal Intent: No Has patient had any suicidal intent within the past 6 months prior to admission? : No Is patient at risk for suicide?: Yes Suicidal Plan?: Yes-Currently Present Has patient had any suicidal plan within the past 6 months prior to admission? : No Specify Current Suicidal Plan: Pt reports thoughts of hanging himself Access to Means: Yes Specify Access to Suicidal Means: pt has access to ropes and traffic What has been your use of drugs/alcohol within the last 12 months?: Pt denies although testing positive for cannabis and benzos Previous Attempts/Gestures: No How many times?: 0 Other Self Harm Risks: none identified Triggers for Past Attempts: None known Intentional Self Injurious Behavior: None Family Suicide History: No Recent stressful life event(s): Job Loss, Financial Problems, Legal Issues, Conflict (Comment) Persecutory voices/beliefs?: No Depression: Yes Depression Symptoms: Loss of interest in usual pleasures, Feeling worthless/self pity, Feeling angry/irritable, Isolating, Insomnia Substance  abuse history and/or treatment for substance abuse?: Yes Suicide prevention information given to non-admitted patients: Not applicable  Risk to Others within the past 6 months Homicidal Ideation: No Does patient  have any lifetime risk of violence toward others beyond the six months prior to admission? : No Thoughts of Harm to Others: No Current Homicidal Intent: No Current Homicidal Plan: No Access to Homicidal Means: No Identified Victim: none identified History of harm to others?: No Assessment of Violence: None Noted Violent Behavior Description: none identified Does patient have access to weapons?: No Criminal Charges Pending?: No Does patient have a court date: No Is patient on probation?: No  Psychosis Hallucinations: None noted Delusions: None noted  Mental Status Report Appearance/Hygiene: In scrubs Eye Contact: Good Motor Activity: Freedom of movement Speech: Logical/coherent Level of Consciousness: Alert Mood: Anxious, Depressed Affect: Anxious, Depressed Anxiety Level: Minimal Thought Processes: Coherent, Relevant Judgement: Partial Orientation: Person, Place, Time, Situation Obsessive Compulsive Thoughts/Behaviors: None  Cognitive Functioning Concentration: Normal Memory: Recent Intact, Remote Intact IQ: Average Insight: Fair Impulse Control: Fair Appetite: Good Weight Loss: 0 Weight Gain: 0 Sleep: Decreased Total Hours of Sleep: 4 Vegetative Symptoms: None  ADLScreening [facility]) Patient's cognitive ability adequate to safely complete daily activities?: Yes Patient able to express need for assistance with ADLs?: Yes Independently performs ADLs?: Yes (appropriate for developmental age)  Prior Inpatient Therapy Prior Inpatient Therapy: No Prior Therapy Dates: na Prior Therapy Facilty/Provider(s): na Reason for Treatment: na  Prior Outpatient Therapy Prior Outpatient Therapy: Yes Prior Therapy Dates: 2017 Prior Therapy Facilty/Provider(s): [facility] Reason for Treatment: anxiety Does patient have an ACCT team?: No Does patient have Intensive In-House Services?  : No Does patient have [facility] services? : No Does patient have P4CC  services?: No  ADL Screening (condition at time of admission) Patient's cognitive ability adequate to safely complete daily activities?: Yes Patient able to express need for assistance with ADLs?: Yes Independently performs ADLs?: Yes (appropriate for developmental age)       Abuse/Neglect Assessment (Assessment to be complete while patient is alone) Physical Abuse: Denies Verbal Abuse: Denies Sexual Abuse: Denies Exploitation of patient/patient's resources: Denies Self-Neglect: Denies Values / Beliefs Cultural Requests During Hospitalization: None Spiritual Requests During Hospitalization: None Consults Spiritual Care Consult Needed: No Social Work Consult Needed: No      Additional Information 1:1 In Past 12 Months?: No CIRT Risk: No Elopement Risk: No Does patient have medical clearance?: Yes     Disposition:  Disposition Initial Assessment Completed for this Encounter: Yes Disposition of Patient: Other dispositions Other disposition(s): Other (Comment) (Pending Psych MD consult)  On Site Evaluation by:   Reviewed with Physician:    Artist Beachoxana C Silver Achey 10/14/2016 10:38 PM

## 2016-10-15 ENCOUNTER — Inpatient Hospital Stay
Admission: EM | Admit: 2016-10-15 | Discharge: 2016-10-19 | DRG: 885 | Disposition: A | Discharge status: Home / Self Care | Payer: No Typology Code available for payment source | Source: Intra-hospital | Attending: Psychiatry | Admitting: Psychiatry

## 2016-10-15 DIAGNOSIS — F410 Panic disorder [episodic paroxysmal anxiety] without agoraphobia: Secondary | ICD-10-CM | POA: Diagnosis present

## 2016-10-15 DIAGNOSIS — F17210 Nicotine dependence, cigarettes, uncomplicated: Secondary | ICD-10-CM | POA: Diagnosis present

## 2016-10-15 DIAGNOSIS — G4700 Insomnia, unspecified: Secondary | ICD-10-CM | POA: Diagnosis present

## 2016-10-15 DIAGNOSIS — F4310 Post-traumatic stress disorder, unspecified: Secondary | ICD-10-CM | POA: Diagnosis present

## 2016-10-15 DIAGNOSIS — Z818 Family history of other mental and behavioral disorders: Secondary | ICD-10-CM

## 2016-10-15 DIAGNOSIS — F3181 Bipolar II disorder: Secondary | ICD-10-CM | POA: Diagnosis present

## 2016-10-15 DIAGNOSIS — F429 Obsessive-compulsive disorder, unspecified: Secondary | ICD-10-CM | POA: Diagnosis present

## 2016-10-15 DIAGNOSIS — F4010 Social phobia, unspecified: Secondary | ICD-10-CM | POA: Diagnosis present

## 2016-10-15 DIAGNOSIS — F1220 Cannabis dependence, uncomplicated: Secondary | ICD-10-CM | POA: Diagnosis present

## 2016-10-15 DIAGNOSIS — R45851 Suicidal ideations: Secondary | ICD-10-CM | POA: Diagnosis present

## 2016-10-15 DIAGNOSIS — F17200 Nicotine dependence, unspecified, uncomplicated: Secondary | ICD-10-CM | POA: Diagnosis present

## 2016-10-15 MED ORDER — ALUM & MAG HYDROXIDE-SIMETH 200-200-20 MG/5ML PO SUSP: 30.0000 mL | ORAL | Status: DC | PRN

## 2016-10-15 MED ORDER — QUETIAPINE FUMARATE 25 MG PO TABS: 50.0000 mg | ORAL_TABLET | Freq: Every day | ORAL | Status: DC

## 2016-10-15 MED ORDER — QUETIAPINE FUMARATE 100 MG PO TABS
100.0000 mg | ORAL_TABLET | Freq: Every day | ORAL | Status: DC
  Administered 2016-10-15: 100 mg via ORAL
  Filled 2016-10-15: qty 1

## 2016-10-15 MED ORDER — QUETIAPINE FUMARATE 25 MG PO TABS: 100.0000 mg | ORAL_TABLET | Freq: Every day | ORAL | Status: DC

## 2016-10-15 MED ORDER — MAGNESIUM HYDROXIDE 400 MG/5ML PO SUSP
30.0000 mL | Freq: Every day | ORAL | Status: DC | PRN
Start: 2016-10-15 — End: 2016-10-19

## 2016-10-15 MED ORDER — NICOTINE 21 MG/24HR TD PT24
21.0000 mg | MEDICATED_PATCH | Freq: Every day | TRANSDERMAL | Status: DC
  Administered 2016-10-15 – 2016-10-19 (×5): 21 mg via TRANSDERMAL
  Filled 2016-10-15 (×5): qty 1

## 2016-10-15 MED ORDER — ACETAMINOPHEN 325 MG PO TABS
650.0000 mg | ORAL_TABLET | Freq: Four times a day (QID) | ORAL | Status: DC | PRN
Start: 2016-10-15 — End: 2016-10-19
  Administered 2016-10-16 – 2016-10-18 (×5): 650 mg via ORAL
  Filled 2016-10-15 (×2): qty 2
  Filled 2016-10-15: qty 1
  Filled 2016-10-15 (×3): qty 2

## 2016-10-15 NOTE — ED Notes (Signed)
Pt requests shower- given toiletries, towels and clean scrubs

## 2016-10-15 NOTE — ED Notes (Signed)
Pt waiting to be transferred downstairs. Pt given fluids and snack

## 2016-10-15 NOTE — ED Notes (Signed)
[facility]  Patient to be transferred downstairs to Bed 311

## 2016-10-15 NOTE — Consult Note (Signed)
[place] Psychiatry Consult   Reason for Consult:  Consult for 41 year old man who has a history of being diagnosed with bipolar disorder. He came to the hospital because he says he is tired of his mood swings Referring Physician:  Marcelene Butte Patient Identification: [name] MRN:  [id number] Principal Diagnosis: Bipolar 2 disorder [facility]) Diagnosis:   Patient Active Problem List   Diagnosis Date Noted  . Bipolar 2 disorder ([place]) [F31.81] 10/15/2016  . Cannabis abuse [F12.10] 10/15/2016  . Suicidal ideation [R45.851] 10/15/2016    Total Time spent with patient: 1 hour  Subjective:   [name] is a 41 y.o. male patient admitted with "I'm just tired of how I've been feeling".  HPI:  Patient interviewed and chart reviewed. Patient tells me that he has had problems with depression and anxiety all of his life recently however things of been worse. He feels down most of the time. He also complains of mood swings saying that he will have some days when he will feel very energetic and irritable and other days when he feels tired and fatigued. He is not currently working and says he's had trouble ever keeping a job because of his behavior and mood problems. His sleep is erratic varying a great deal from day to day. He admits that he uses marijuana occasionally but says he drinks rarely and is not abusing any other drugs. He is not currently receiving any mental health treatment and has not been taking any psychiatric medicine recently. He says he's been having thoughts recently about feeling hopeless and wishing to die and thinking about killing himself. Yesterday was feeling angry and like he was going to "go off" but didn't have anyone he particularly wanted to hurt. He also tells me that he has hallucinations that happen occasionally and that those of gone on for years and he has always avoided talking about them in the past.  Social history: Living with his mother and stepfather in  [place]. Doesn't get along with his stepfather at all. Not currently working outside the home. Her [employer]  Medical history: Patient had an injury to his fourth finger on his right hand with a torn tendon. He had surgery for it but he admits that he did not take care of it the way it was recommended and he wound up reinjuring yet. He now has chronic decreased use and mobility in his fourth finger on that and so he keeps it strapped to his middle finger for comfort and function. No other medical problems  Substance abuse history: He says he used to have more problem with drinking but now only drinks on weekends and says that he doesn't get drunk or have problems with it then. He tends to minimize the recurrent alcohol use. Says that he uses marijuana occasionally but not every day and denies use of any other drugs. Drug screen positive for marijuana. Also benzodiazepines but that is most likely the medicine he was given in the emergency room her [facility]  Past Psychiatric History: Patient says he has had previous psychiatric hospitalizations. [facility] and has tried going to [facility] recently but has not followed through. He has been on this is in the past including Depakote Zoloft Celexa and Zyprexa and trazodone. He remembers Celexa making him feel jittery and Zyprexa making him feel too sedated and the Depakote and Zoloft did not seem to be of any benefit. He denies any history of suicide attempts  Risk  to Self: Suicidal Ideation: Yes-Currently Present Suicidal Intent: No Is patient at risk for suicide?: Yes Suicidal Plan?: Yes-Currently Present Specify Current Suicidal Plan: Pt reports thoughts of hanging himself Access to Means: Yes Specify Access to Suicidal Means: pt has access to ropes and traffic What has been your use of drugs/alcohol within the last 12 months?: Pt denies although testing positive for cannabis and benzos How many times?: 0 Other Self Harm Risks: none  identified Triggers for Past Attempts: None known Intentional Self Injurious Behavior: None Risk to Others: Homicidal Ideation: No Thoughts of Harm to Others: No Current Homicidal Intent: No Current Homicidal Plan: No Access to Homicidal Means: No Identified Victim: none identified History of harm to others?: No Assessment of Violence: None Noted Violent Behavior Description: none identified Does patient have access to weapons?: No Criminal Charges Pending?: No Does patient have a court date: No Prior Inpatient Therapy: Prior Inpatient Therapy: No Prior Therapy Dates: na Prior Therapy Facilty/Provider(s): na Reason for Treatment: na Prior Outpatient Therapy: Prior Outpatient Therapy: Yes Prior Therapy Dates: 2017 Prior Therapy Facilty/Provider(s): [facility] Reason for Treatment: anxiety Does patient have an ACCT team?: No Does patient have Intensive In-House Services?  : No Does patient have [facility] services? : No Does patient have P4CC services?: No  Past Medical History:  Past Medical History:  Diagnosis Date  . Depression   . Sinus congestion     Past Surgical History:  Procedure Laterality Date  . ANKLE SURGERY    . HERNIA REPAIR    . HERNIA REPAIR    . TENDON REPAIR     Family History: No family history on file. Family Psychiatric  History: He says his father had schizophrenia and was a violent man. No history of suicide in the family Social History:  History  Alcohol Use No     History  Drug use: Unknown    Social History   Social History  . Marital status: Single    Spouse name: N/A  . Number of children: N/A  . Years of education: N/A   Social History Main Topics  . Smoking status: Current Every Day Smoker    Packs/day: 1.00    Types: Cigarettes  . Smokeless tobacco: Never Used  . Alcohol use No  . Drug use: Unknown  . Sexual activity: Not Asked   Other Topics Concern  . None   Social History Narrative  . None   Additional Social  History:    Allergies:  No Known Allergies  Labs:  Results for orders placed or performed during the hospital encounter of 10/14/16 (from the past 48 hour(s))  CBC with Differential     Status: Abnormal   Collection Time: 10/14/16  5:28 PM  Result Value Ref Range   WBC 8.2 3.8 - 10.6 K/uL   RBC 5.24 4.40 - 5.90 MIL/uL   Hemoglobin 15.9 13.0 - 18.0 g/dL   HCT 45.6 40.0 - 52.0 %   MCV 87.0 80.0 - 100.0 fL   MCH 30.3 26.0 - 34.0 pg   MCHC 34.9 32.0 - 36.0 g/dL   RDW 15.2 (H) 11.5 - 14.5 %   Platelets 342 150 - 440 K/uL   Neutrophils Relative % 56 %   Neutro Abs 4.6 1.4 - 6.5 K/uL   Lymphocytes Relative 33 %   Lymphs Abs 2.7 1.0 - 3.6 K/uL   Monocytes Relative 8 %   Monocytes Absolute 0.6 0.2 - 1.0 K/uL   Eosinophils Relative 3 %   Eosinophils  Absolute 0.2 0 - 0.7 K/uL   Basophils Relative 2 %   Basophils Absolute 0.1 0 - 0.1 K/uL  Comprehensive metabolic panel     Status: Abnormal   Collection Time: 10/14/16  5:28 PM  Result Value Ref Range   Sodium 139 135 - 145 mmol/L   Potassium 3.5 3.5 - 5.1 mmol/L   Chloride 104 101 - 111 mmol/L   CO2 25 22 - 32 mmol/L   Glucose, Bld 111 (H) 65 - 99 mg/dL   BUN 8 6 - 20 mg/dL   Creatinine, Ser 0.73 0.61 - 1.24 mg/dL   Calcium 9.5 8.9 - 10.3 mg/dL   Total Protein 7.7 6.5 - 8.1 g/dL   Albumin 4.4 3.5 - 5.0 g/dL   AST 39 15 - 41 U/L   ALT 121 (H) 17 - 63 U/L   Alkaline Phosphatase 109 38 - 126 U/L   Total Bilirubin 0.3 0.3 - 1.2 mg/dL   GFR calc non Af Amer >60 >60 mL/min   GFR calc Af Amer >60 >60 mL/min    Comment: (NOTE) The eGFR has been calculated using the CKD EPI equation. This calculation has not been validated in all clinical situations. eGFR's persistently <60 mL/min signify possible Chronic Kidney Disease.    Anion gap 10 5 - 15  Ethanol     Status: None   Collection Time: 10/14/16  5:28 PM  Result Value Ref Range   Alcohol, Ethyl (B) <5 <5 mg/dL    Comment:        LOWEST DETECTABLE LIMIT FOR SERUM ALCOHOL IS 5  mg/dL FOR MEDICAL PURPOSES ONLY   Acetaminophen level     Status: Abnormal   Collection Time: 10/14/16  5:28 PM  Result Value Ref Range   Acetaminophen (Tylenol), Serum <10 (L) 10 - 30 ug/mL    Comment:        THERAPEUTIC CONCENTRATIONS VARY SIGNIFICANTLY. A RANGE OF 10-30 ug/mL MAY BE AN EFFECTIVE CONCENTRATION FOR MANY PATIENTS. HOWEVER, SOME ARE BEST TREATED AT CONCENTRATIONS OUTSIDE THIS RANGE. ACETAMINOPHEN CONCENTRATIONS >150 ug/mL AT 4 HOURS AFTER INGESTION AND >50 ug/mL AT 12 HOURS AFTER INGESTION ARE OFTEN ASSOCIATED WITH TOXIC REACTIONS.   Salicylate level     Status: None   Collection Time: 10/14/16  5:28 PM  Result Value Ref Range   Salicylate Lvl <6.6 2.8 - 30.0 mg/dL  Urine Drug Screen, Qualitative ([facility] only)     Status: Abnormal   Collection Time: 10/14/16  5:30 PM  Result Value Ref Range   Tricyclic, Ur Screen NONE DETECTED NONE DETECTED   Amphetamines, Ur Screen NONE DETECTED NONE DETECTED   MDMA (Ecstasy)Ur Screen NONE DETECTED NONE DETECTED   Cocaine Metabolite,Ur [place] NONE DETECTED NONE DETECTED   Opiate, Ur Screen NONE DETECTED NONE DETECTED   Phencyclidine (PCP) Ur S NONE DETECTED NONE DETECTED   Cannabinoid 50 Ng, Ur [place] POSITIVE (A) NONE DETECTED   Barbiturates, Ur Screen NONE DETECTED NONE DETECTED   Benzodiazepine, Ur Scrn POSITIVE (A) NONE DETECTED   Methadone Scn, Ur NONE DETECTED NONE DETECTED    Comment: (NOTE) 440  Tricyclics, urine               Cutoff 1000 ng/mL 200  Amphetamines, urine             Cutoff 1000 ng/mL 300  MDMA (Ecstasy), urine           Cutoff 500 ng/mL 400  Cocaine Metabolite, urine       Cutoff  300 ng/mL 500  Opiate, urine                   Cutoff 300 ng/mL 600  Phencyclidine (PCP), urine      Cutoff 25 ng/mL 700  Cannabinoid, urine              Cutoff 50 ng/mL 800  Barbiturates, urine             Cutoff 200 ng/mL 900  Benzodiazepine, urine           Cutoff 200 ng/mL 1000 Methadone, urine                Cutoff 300  ng/mL 1100 1200 The urine drug screen provides only a preliminary, unconfirmed 1300 analytical test result and should not be used for non-medical 1400 purposes. Clinical consideration and professional judgment should 1500 be applied to any positive drug screen result due to possible 1600 interfering substances. A more specific alternate chemical method 1700 must be used in order to obtain a confirmed analytical result.  1800 Gas chromato graphy / mass spectrometry (GC/MS) is the preferred 1900 confirmatory method.   Urinalysis, Complete w Microscopic     Status: Abnormal   Collection Time: 10/14/16  5:30 PM  Result Value Ref Range   Color, Urine YELLOW (A) YELLOW   APPearance CLEAR (A) CLEAR   Specific Gravity, Urine 1.017 1.005 - 1.030   pH 6.0 5.0 - 8.0   Glucose, UA NEGATIVE NEGATIVE mg/dL   Hgb urine dipstick NEGATIVE NEGATIVE   Bilirubin Urine NEGATIVE NEGATIVE   Ketones, ur NEGATIVE NEGATIVE mg/dL   Protein, ur NEGATIVE NEGATIVE mg/dL   Nitrite NEGATIVE NEGATIVE   Leukocytes, UA NEGATIVE NEGATIVE   RBC / HPF NONE SEEN 0 - 5 RBC/hpf   WBC, UA 0-5 0 - 5 WBC/hpf   Bacteria, UA NONE SEEN NONE SEEN   Squamous Epithelial / LPF NONE SEEN NONE SEEN   Mucous PRESENT     Current Facility-Administered Medications  Medication Dose Route Frequency Provider Last Rate Last Dose  . LORazepam (ATIVAN) tablet 1 mg  1 mg Oral Q6H PRN Schuyler Amor, MD   1 mg at 10/15/16 0919  . QUEtiapine (SEROQUEL XR) 24 hr tablet 50 mg  50 mg Oral Daily Schuyler Amor, MD   50 mg at 10/15/16 0919   No current outpatient prescriptions on file.    Musculoskeletal: Strength & Muscle Tone: within normal limits Gait & Station: normal Patient leans: N/A  Psychiatric Specialty Exam: Physical Exam  Constitutional: He appears well-developed and well-nourished.  HENT:  Head: Normocephalic and atraumatic.  Eyes: Conjunctivae are normal. Pupils are equal, round, and reactive to light.  Neck: Normal  range of motion.  Cardiovascular: Normal heart sounds.   Respiratory: Effort normal.  GI: Soft.  Musculoskeletal: Normal range of motion.       Arms: Neurological: He is alert.  Skin: Skin is warm and dry.  Psychiatric: His affect is blunt. His speech is delayed. He is slowed. He expresses impulsivity. He exhibits a depressed mood. He expresses suicidal ideation. He exhibits abnormal recent memory.    Review of Systems  Constitutional: Negative.   HENT: Negative.   Eyes: Negative.   Respiratory: Negative.   Cardiovascular: Negative.   Gastrointestinal: Negative.   Musculoskeletal: Negative.   Skin: Negative.   Neurological: Negative.   Psychiatric/Behavioral: Positive for depression, hallucinations, substance abuse and suicidal ideas. Negative for memory loss. The patient is nervous/anxious and has  insomnia.     Blood pressure 128/77, pulse 68, temperature 98 F (36.7 C), temperature source Oral, resp. rate 16, height _0  (1.854 m), weight 87.5 kg (193 lb), SpO2 98 %.Body mass index is 25.46 kg/m.  General Appearance: Casual  Eye Contact:  Good  Speech:  Clear and Coherent  Volume:  Normal  Mood:  Dysphoric  Affect:  Constricted  Thought Process:  Goal Directed  Orientation:  Full (Time, Place, and Person)  Thought Content:  Logical  Suicidal Thoughts:  Yes.  without intent/plan  Homicidal Thoughts:  No  Memory:  Immediate;   Good Recent;   Fair Remote;   Fair  Judgement:  Fair  Insight:  Fair  Psychomotor Activity:  Decreased  Concentration:  Concentration: Fair  Recall:  [employer] of Knowledge:  Fair  Language:  Fair  Akathisia:  No  Handed:  Right  AIMS (if indicated):     Assets:  Communication Skills Desire for Improvement Housing Physical Health Resilience  ADL's:  Intact  Cognition:  WNL  Sleep:        Treatment Plan Summary: Daily contact with patient to assess and evaluate symptoms and progress in treatment, Medication management and Plan  41 year old man who presents with a 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 Has been having more intrusive suicidal thoughts recently. He is not functioning well in his life and is not currently seeing an outpatient provider. Although he is not acutely planning to harm himself he has had suicidal thoughts and he reports that he would feel more comfortable coming into the hospital as he has not been following up with outpatient treatment. Patient will be started on Seroquel. He will be admitted to the psychiatric unit. 15 minute checks in place. Full set of labs. Patient agrees to the plan. Case reviewed with TTS and emergency room physician.  Disposition: Recommend psychiatric Inpatient admission when medically cleared. Supportive therapy provided about ongoing stressors.  Alethia Berthold, MD 10/15/2016 12:04 PM

## 2016-10-15 NOTE — ED Notes (Signed)
Patient under IVC transferred to lower level-BMU-accompanied by police and staff. Pt transported in W/C with all belongings and paperwork.

## 2016-10-15 NOTE — Tx Team (Signed)
Initial Treatment Plan 10/15/2016 6:50 PM [name] [id number]    PATIENT STRESSORS: Financial difficulties Legal issue Marital or family conflict Medication change or noncompliance Occupational concerns Substance abuse   PATIENT STRENGTHS: Capable of independent living Physical Health Supportive family/friends Work skills   PATIENT IDENTIFIED PROBLEMS:   Suicidal thoughts with a plan to hang on admission to ED     Anxiety, "especially when I first wake up in the morning"    Medication noncompliance           DISCHARGE CRITERIA:  Ability to meet basic life and health needs Adequate post-discharge living arrangements Improved stabilization in mood, thinking, and/or behavior Medical problems require only outpatient monitoring Motivation to continue treatment in a less acute level of care Verbal commitment to aftercare and medication compliance Withdrawal symptoms are absent or subacute and managed without 24-hour nursing intervention  PRELIMINARY DISCHARGE PLAN: Attend aftercare/continuing care group Outpatient therapy Return to previous living arrangement  PATIENT/FAMILY INVOLVEMENT: This treatment plan has been presented to and reviewed with the patient, [name], and/or family member, .  The patient and family have been given the opportunity to ask questions and make suggestions.  Tonye PearsonAmanda N Klover Priestly, RN 10/15/2016, 6:50 PM

## 2016-10-15 NOTE — ED Provider Notes (Signed)
-----------------------------------------   7:06 AM on 10/15/2016 -----------------------------------------   Blood pressure 128/77, pulse 68, temperature 98 F (36.7 C), temperature source Oral, resp. rate 16, height 6\' 1"  (1.854 m), weight 193 lb (87.5 kg), SpO2 98 %.  The patient had no acute events since last update.  Calm and cooperative at this time.  Disposition is pending Psychiatry/Behavioral Medicine team recommendations.     Jennye MoccasinBrian S Feather Berrie, MD 10/15/16 [phone]
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
## 2016-10-15 NOTE — BH Assessment (Addendum)
[facility]. Toni Amendlapacs Attending Physician will be Dr. Ardyth HarpsHernandez Patient [facility] Q[facility].   .  ER staff Irving BurtonEmily made aware and [facility]

## 2016-10-15 NOTE — ED Notes (Signed)
Pt given breakfast.

## 2016-10-16 ENCOUNTER — Encounter: Payer: Self-pay | Admitting: Psychiatry

## 2016-10-16 DIAGNOSIS — F17200 Nicotine dependence, unspecified, uncomplicated: Secondary | ICD-10-CM | POA: Diagnosis present

## 2016-10-16 LAB — LIPID PANEL
CHOL/HDL RATIO: 3.3 ratio
Cholesterol: 171 mg/dL (ref 0–200)
HDL: 52 mg/dL (ref >40)
LDL CALC: 90 mg/dL (ref 0–99)
Triglycerides: 146 mg/dL (ref <150)
VLDL: 29 mg/dL (ref 0–40)

## 2016-10-16 LAB — TSH: TSH: 0.752 u[IU]/mL (ref 0.350–4.500)

## 2016-10-16 MED ORDER — HYDROXYZINE HCL 50 MG PO TABS
50.0000 mg | ORAL_TABLET | ORAL | Status: DC | PRN
  Administered 2016-10-16: 50 mg via ORAL
  Filled 2016-10-16: qty 1

## 2016-10-16 MED ORDER — FLUVOXAMINE MALEATE 50 MG PO TABS
50.0000 mg | ORAL_TABLET | Freq: Every day | ORAL | Status: DC
  Administered 2016-10-16 – 2016-10-18 (×2): 50 mg via ORAL
  Filled 2016-10-16 (×3): qty 1

## 2016-10-16 MED ORDER — QUETIAPINE FUMARATE 200 MG PO TABS
200.0000 mg | ORAL_TABLET | Freq: Every day | ORAL | Status: DC
  Administered 2016-10-16 – 2016-10-18 (×3): 200 mg via ORAL
  Filled 2016-10-16 (×3): qty 1

## 2016-10-16 MED ORDER — LITHIUM CARBONATE 300 MG PO CAPS
300.0000 mg | ORAL_CAPSULE | Freq: Three times a day (TID) | ORAL | Status: DC
  Administered 2016-10-16 – 2016-10-17 (×4): 300 mg via ORAL
  Filled 2016-10-16 (×4): qty 1

## 2016-10-16 MED ORDER — FLUVOXAMINE MALEATE 50 MG PO TABS
50.0000 mg | ORAL_TABLET | Freq: Every day | ORAL | Status: DC
  Administered 2016-10-17 – 2016-10-18 (×2): 50 mg via ORAL
  Filled 2016-10-16 (×2): qty 1

## 2016-10-16 MED ORDER — PRAZOSIN HCL 2 MG PO CAPS
2.0000 mg | ORAL_CAPSULE | Freq: Two times a day (BID) | ORAL | Status: DC
  Administered 2016-10-16 – 2016-10-17 (×2): 2 mg via ORAL
  Filled 2016-10-16 (×2): qty 1

## 2016-10-16 NOTE — Progress Notes (Signed)
Pt appropriately interacts with staff/peers. Reports fair sleep last night, was reported by night shift that pt stayed up late talking with roommate. Reports good appetite, low energy, poor concentration. Rates depression 5/10, hopelessness 7/10, anxiety 10/10 (low 0-10 high). Denies SI/HI/AVH. Appears anxious. Pt refused medications this morning, states per self inventory, "I need to talk to doctor, I need the adavan back."  Support and encouragement provided with use of therapeutic communication. Medications administered as ordered with education. Safety maintain with every 15 minute checks. Will continue to monitor.
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
## 2016-10-16 NOTE — Progress Notes (Signed)
Patient was pleasant on approach, he interacted well with peers and staff. He was visible in the milieu,  and spent most of the time in the dayroom with peers.  He was medication compliant and cooperative with treatment on shift, no behavioral issues to report on shift at this time.

## 2016-10-16 NOTE — Progress Notes (Signed)
Pt alert, oriented and awake in his room this morning.  States he does not want to take the Seroquel this morning because it makes him too tired. Requests ativan. Encouraged pt to speak with doctor regarding medication concerns. safety maintained. Will continue to monitor.

## 2016-10-16 NOTE — Progress Notes (Signed)
Recreation Therapy Notes  Date: 01.12.18 Time: 1:00 pm Location: Craft Room  Group Topic: Social Skills  Goal Area(s) Addresses:  Patient will effectively work with peer towards shared goal. Patient will identify skills used to make activity successful. Patient will identify how skills used during activity can be used to reach post d/c. goals.  Behavioral Response: Attentive, Interactive  Intervention: Life Boat  Activity: Patients were given a scenario that we were on a big ship and the boat sprung a leak and was sinking. They were given a list of 15 people Ship broker([employer], Runner, broadcasting/film/videoteacher, nurse, etc.) and were instructed to pick a list of 8 people to take with them on a bigger boat and 7 people who would be put in a raft.  Education: LRT educated patients on healthy support systems.  Education Outcome: Patient left before LRT educated group.   Clinical Observations/Feedback: Patient worked with peers to solve problem. Patient used effective communication, problem solving, and teamwork skills. Patient contributed to group discussion by stating some skills he used in group. Patient left group at approximately 1:35 pm and did not return to group.  Jacquelynn CreeGreene,Ayannah Faddis M, LRT/CTRS 10/16/2016 3:27 PM
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
## 2016-10-16 NOTE — BHH Suicide Risk Assessment (Signed)
[facility] Admission Suicide Risk Assessment   Nursing information obtained from:  Patient Demographic factors:  Male, Caucasian, Unemployed, Low socioeconomic status Current Mental Status:  Suicidal ideation indicated by patient Loss Factors:  Decrease in vocational status, Legal issues, Financial problems / change in socioeconomic status Historical Factors:  Family history of mental illness or substance abuse, Impulsivity Risk Reduction Factors:  Sense of responsibility to family, Living with another person, especially a relative  Total Time spent with patient: 1 hour Principal Problem: Bipolar 2 disorder (HCC) Diagnosis:   Patient Active Problem List   Diagnosis Date Noted  . Tobacco use disorder [F17.200] 10/16/2016  . Bipolar 2 disorder (HCC) [F31.81] 10/15/2016  . Cannabis use disorder, moderate, dependence (HCC) [F12.20] 10/15/2016  . Suicidal ideation [R45.851] 10/15/2016   Subjective Data: suicidal ideation.  Continued Clinical Symptoms:  Alcohol Use Disorder Identification Test Final Score (AUDIT): 18 The "Alcohol Use Disorders Identification Test", Guidelines for Use in Primary Care, Second Edition.  World Science writer [facility]). Score between 0-7:  no or low risk or alcohol related problems. Score between 8-15:  moderate risk of alcohol related problems. Score between 16-19:  high risk of alcohol related problems. Score 20 or above:  warrants further diagnostic evaluation for alcohol dependence and treatment.   CLINICAL FACTORS:   Severe Anxiety and/or Agitation Depression:   Comorbid alcohol abuse/dependence Impulsivity Alcohol/Substance Abuse/Dependencies Obsessive-Compulsive Disorder Currently Psychotic   Musculoskeletal: Strength & Muscle Tone: within normal limits Gait & Station: normal Patient leans: N/A  Psychiatric Specialty Exam: Physical Exam  Nursing note and vitals reviewed.   Review of Systems  Psychiatric/Behavioral: Positive for depression,  hallucinations, substance abuse and suicidal ideas. The patient is nervous/anxious.   All other systems reviewed and are negative.   Blood pressure 121/89, pulse 90, temperature 98 F (36.7 C), temperature source Oral, resp. rate 18, height 6\' 1"  (1.854 m), weight 85.7 kg (189 lb), SpO2 99 %.Body mass index is 24.94 kg/m.  General Appearance: Casual  Eye Contact:  Fair  Speech:  Clear and Coherent  Volume:  Normal  Mood:  Anxious  Affect:  Appropriate  Thought Process:  Goal Directed and Descriptions of Associations: Intact  Orientation:  Full (Time, Place, and Person)  Thought Content:  Hallucinations: Auditory Command:  Commanding him to kill himself  Suicidal Thoughts:  Yes.  with intent/plan  Homicidal Thoughts:  No  Memory:  Immediate;   Fair Recent;   Fair Remote;   Fair  Judgement:  Impaired  Insight:  Lacking  Psychomotor Activity:  Normal  Concentration:  Concentration: Fair and Attention Span: Fair  Recall:  [employer] of Knowledge:  Fair  Language:  Fair  Akathisia:  No  Handed:  Right  AIMS (if indicated):     Assets:  Communication Skills Desire for Improvement Housing Physical Health Resilience Social Support  ADL's:  Intact  Cognition:  WNL  Sleep:  Number of Hours: 5      COGNITIVE FEATURES THAT CONTRIBUTE TO RISK:  None    SUICIDE RISK:   Severe:  Frequent, intense, and enduring suicidal ideation, specific plan, no subjective intent, but some objective markers of intent (i.e., choice of lethal method), the method is accessible, some limited preparatory behavior, evidence of impaired self-control, severe dysphoria/symptomatology, multiple risk factors present, and few if any protective factors, particularly a lack of social support.   PLAN OF CARE: Hospital admission, medication management, substance abuse counseling, discharge planning.  Mr. Ekstein is a 41 year old male with a history  of mood instability and poor impulse control admitted for  suicidal ideation in response to auditory command hallucinations.  1. Suicidal ideation. The patient is able to contract for safety in the hospital.  2. Mood and psychosis. The patient was started on Seroquel in the emergency room we will increase dose to 200 mg at night for psychosis. He agrees to start lithium 300 mg 3 times daily.  3. Anxiety. The patient reports panic attacks, social anxiety, OCD or PTSD type symptoms. We started Minipress for nightmares and flashbacks and Luvox for OCD. Vistaril is also available.  4. Substance abuse. The patient minimizes his problems and declines treatment.  5. Metabolic syndrome monitoring. Lipid profile, TSH and hemoglobin A1c are pending.  6. Disposition. He will be discharged to his mother's house. [facility]  I certify that inpatient services furnished can reasonably be expected to improve the patient's condition.  Kristine LineaJolanta Mirza Kidney, MD 10/16/2016, 12:25 PM

## 2016-10-16 NOTE — Plan of Care (Signed)
Problem: Activity: Goal: Interest or engagement in leisure activities will improve Outcome: Progressing Pt visible in dayroom during free time, socializing appropriately with staff and peers.

## 2016-10-16 NOTE — BHH Group Notes (Addendum)
[facility] LCSW Group Therapy   10/16/2016 9:30am  Type of Therapy: Group Therapy   Participation Level: Did Not Attend. Patient invited to participate but declined.    Dulce Martian F. Naziyah Tieszen, MSW, LCSWA, LCAS       

## 2016-10-16 NOTE — Progress Notes (Signed)
Pt denies SI/HI/AVH.  Pt inquired about PRN ativan. Pt stated he would talk to MD tomorrow about it being DC'd. Pt stated he couldn't take the Seroquel in the a.m. Because " I will never get out of the bed". Pt informed that it was half of what the evening dose was. Pt encouraged to address concerns with MD. Pt affect anxious. Cooperative and appropriate during interaction. Visible socializing in dayroom during free time.  Medication compliant. Denies pain. Voices no additional concerns at this time.

## 2016-10-16 NOTE — Plan of Care (Signed)
Problem: Medication: Goal: Compliance with prescribed medication regimen will improve Outcome: Not Progressing Pt noncompliant with ordered Seroquel this morning. Medications changed, pt compliant.

## 2016-10-17 DIAGNOSIS — F3181 Bipolar II disorder: Principal | ICD-10-CM

## 2016-10-17 LAB — HEMOGLOBIN A1C
HEMOGLOBIN A1C: 5.6 % (ref 4.8–5.6)
MEAN PLASMA GLUCOSE: 114 mg/dL

## 2016-10-17 LAB — PROLACTIN: Prolactin: 13.9 ng/mL (ref 4.0–15.2)

## 2016-10-17 MED ORDER — LITHIUM CARBONATE 300 MG PO CAPS
300.0000 mg | ORAL_CAPSULE | Freq: Two times a day (BID) | ORAL | Status: DC
  Administered 2016-10-17 – 2016-10-19 (×4): 300 mg via ORAL
  Filled 2016-10-17 (×4): qty 1

## 2016-10-17 NOTE — BHH Group Notes (Signed)
[facility] Group Notes:  (Nursing/MHT/Case Management/Adjunct)  Date:  10/17/2016  Time:  5:23 PM  Type of Therapy:  Music Therapy  Participation Level:  Active  Participation Quality:  Appropriate  Affect:  Appropriate  Cognitive:  Appropriate  Insight:  Good  Engagement in Group:  Engaged and Supportive  Modes of Intervention:  Education, Socialization and Support  Summary of Progress/Problems:  Sterling BigGregory  Ellen Mayol 10/17/2016, 5:23 PM

## 2016-10-17 NOTE — Plan of Care (Signed)
Problem: Coping: Goal: Ability to verbalize frustrations and anger appropriately will improve Outcome: Progressing Working on coping skills    

## 2016-10-17 NOTE — B[facility] Group Notes (Signed)
[facility] Group Notes:  (Nursing/MHT/Case Management/Adjunct)  Date:  10/17/2016  Time:  1:46 PM  Type of Therapy:  Music Therapy  Participation Level:  Active  Participation Quality:  Appropriate  Affect:  Appropriate  Cognitive:  Appropriate  Insight:  Good  Engagement in Group:  Engaged  Modes of Intervention:  Discussion, Education and Socialization  Summary of Progress/Problems:  [name]  [name] 10/17/2016, 1:46 PM

## 2016-10-17 NOTE — Progress Notes (Signed)
[facility]  10/17/2016 2:59 PM [name]  MRN:  [id number] Subjective:  "I'm still feeling really nervous" this is a 41 year old man with a history of anxiety and mood instability. Presented to the hospital with suicidal thoughts irritability and anxiety. Currently being treated for a day. He is still complaining of being anxious and nervous much of the time. Somewhat intrusive but does not appear to be clearly psychotic. Today he is complaining of being sick to his stomach. He says he thinks the medicine is causing that. He seems to be very focused on somatic issues. Principal Problem: Bipolar 2 disorder (HCC) Diagnosis:   Patient Active Problem List   Diagnosis Date Noted  . Tobacco use disorder [F17.200] 10/16/2016  . Bipolar 2 disorder (HCC) [F31.81] 10/15/2016  . Cannabis use disorder, moderate, dependence (HCC) [F12.20] 10/15/2016  . Suicidal ideation [R45.851] 10/15/2016   Total Time spent with patient: 30 minutes  Past Psychiatric History: No significant past history.  Past Medical History:  Past Medical History:  Diagnosis Date  . Depression   . Sinus congestion     Past Surgical History:  Procedure Laterality Date  . ANKLE SURGERY    . HERNIA REPAIR    . HERNIA REPAIR    . TENDON REPAIR     Family History: History reviewed. No pertinent family history. Family Psychiatric  History: None Social History:  History  Alcohol Use  . 7.2 oz/week  . 12 Cans of beer per week    Comment: reports weekend use (fri/sat night)     History  Drug Use  . Frequency: 3.0 times per week  . Types: Marijuana    Comment: uses 2-3 times weekly since 41 years old    Social History   Social History  . Marital status: Single    Spouse name: N/A  . Number of children: N/A  . Years of education: N/A   Social History Main Topics  . Smoking status: Current Every Day Smoker    Packs/day: 1.00    Types: Cigarettes  . Smokeless tobacco: Never Used  . Alcohol use 7.2  oz/week    12 Cans of beer per week     Comment: reports weekend use (fri/sat night)  . Drug use:     Frequency: 3.0 times per week    Types: Marijuana     Comment: uses 2-3 times weekly since 41 years old  . Sexual activity: Not Asked   Other Topics Concern  . None   Social History Narrative  . None   Additional Social History:    History of alcohol / drug use?: Yes Negative Consequences of Use: Financial, Armed forces operational officerLegal, Personal relationships, Work / School                    Sleep: Fair  Appetite:  Fair  Current Medications: Current Facility-Administered Medications  Medication Dose Route Frequency Provider Last Rate Last Dose  . acetaminophen (TYLENOL) tablet 650 mg  650 mg Oral Q6H PRN Audery AmelJohn T Nida Manfredi, MD   650 mg at 10/17/16 1455  . alum & mag hydroxide-simeth (MAALOX/MYLANTA) 200-200-20 MG/5ML suspension 30 mL  30 mL Oral Q4H PRN Audery AmelJohn T Mariusz Jubb, MD      . fluvoxaMINE (LUVOX) tablet 50 mg  50 mg Oral QHS Shari ProwsJolanta B Pucilowska, MD   50 mg at 10/16/16 2143  . fluvoxaMINE (LUVOX) tablet 50 mg  50 mg Oral QHS Jolanta B Pucilowska, MD      . hydrOXYzine (  ATARAX/VISTARIL) tablet 50 mg  50 mg Oral Q4H PRN Shari Prows, MD   50 mg at 10/16/16 1214  . lithium carbonate capsule 300 mg  300 mg Oral TID WC Jolanta B Pucilowska, MD   300 mg at 10/17/16 1156  . magnesium hydroxide (MILK OF MAGNESIA) suspension 30 mL  30 mL Oral Daily PRN Audery Amel, MD      . nicotine (NICODERM CQ - dosed in mg/24 hours) patch 21 mg  21 mg Transdermal Daily Jolanta B Pucilowska, MD   21 mg at 10/17/16 0826  . prazosin (MINIPRESS) capsule 2 mg  2 mg Oral BID Shari Prows, MD   2 mg at 10/17/16 0824  . QUEtiapine (SEROQUEL) tablet 200 mg  200 mg Oral QHS Shari Prows, MD   200 mg at 10/16/16 2143    Lab Results:  Results for orders placed or performed during the hospital encounter of 10/15/16 (from the past 48 hour(s))  Hemoglobin A1c     Status: None   Collection Time:  10/16/16  6:52 AM  Result Value Ref Range   Hgb A1c MFr Bld 5.6 4.8 - 5.6 %    Comment: (NOTE)         Pre-diabetes: 5.7 - 6.4         Diabetes: >6.4         Glycemic control for adults with diabetes: <7.0    Mean Plasma Glucose 114 mg/dL    Comment: (NOTE) Performed At: [facility] [address] [place], [place] [id number] Mila Homer MD WU:[id number]   Lipid panel     Status: None   Collection Time: 10/16/16  6:52 AM  Result Value Ref Range   Cholesterol 171 0 - 200 mg/dL   Triglycerides 956 <213 mg/dL   HDL 52 >08 mg/dL   Total CHOL/HDL Ratio 3.3 RATIO   VLDL 29 0 - 40 mg/dL   LDL Cholesterol 90 0 - 99 mg/dL    Comment:        Total Cholesterol/HDL:CHD Risk Coronary Heart Disease Risk Table                     Men   Women  1/2 Average Risk   3.4   3.3  Average Risk       5.0   4.4  2 X Average Risk   9.6   7.1  3 X Average Risk  23.4   11.0        Use the calculated Patient Ratio above and the CHD Risk Table to determine the patient's CHD Risk.        ATP III CLASSIFICATION (LDL):  <100     mg/dL   Optimal  657-846  mg/dL   Near or Above                    Optimal  130-159  mg/dL   Borderline  962-952  mg/dL   High  >841     mg/dL   Very High   Prolactin     Status: None   Collection Time: 10/16/16  6:52 AM  Result Value Ref Range   Prolactin 13.9 4.0 - 15.2 ng/mL    Comment: (NOTE) Performed At: [facility] [address] [place], [place] [id number] Mila Homer MD [id number]   TSH     Status: None   Collection Time: 10/16/16  6:52 AM  Result Value Ref Range  TSH 0.752 0.350 - 4.500 uIU/mL    Comment: Performed by a 3rd Generation assay with a functional sensitivity of <=0.01 uIU/mL.    Blood Alcohol level:  Lab Results  Component Value Date   ETH <5 10/14/2016    Metabolic Disorder Labs: Lab Results  Component Value Date   HGBA1C 5.6 10/16/2016   MPG 114 10/16/2016   Lab Results  Component Value Date    PROLACTIN 13.9 10/16/2016   Lab Results  Component Value Date   CHOL 171 10/16/2016   TRIG 146 10/16/2016   HDL 52 10/16/2016   CHOLHDL 3.3 10/16/2016   VLDL 29 10/16/2016   LDLCALC 90 10/16/2016    Physical Findings: AIMS: Facial and Oral Movements Muscles of Facial Expression: None, normal Lips and Perioral Area: None, normal Jaw: None, normal Tongue: None, normal,Extremity Movements Upper (arms, wrists, hands, fingers): None, normal Lower (legs, knees, ankles, toes): None, normal, Trunk Movements Neck, shoulders, hips: None, normal, Overall Severity Severity of abnormal movements (highest score from questions above): None, normal Incapacitation due to abnormal movements: None, normal Patient's awareness of abnormal movements (rate only patient's report): No Awareness, Dental Status Current problems with teeth and/or dentures?: No Does patient usually wear dentures?: No  CIWA:    COWS:     Musculoskeletal: Strength & Muscle Tone: within normal limits Gait & Station: normal Patient leans: N/A  Psychiatric Specialty Exam: Physical Exam  Constitutional: He appears well-developed and well-nourished.  HENT:  Head: Normocephalic and atraumatic.  Eyes: Conjunctivae are normal. Pupils are equal, round, and reactive to light.  Neck: Normal range of motion.  Cardiovascular: Normal heart sounds.   Respiratory: Effort normal.  GI: Soft.  Musculoskeletal: Normal range of motion.  Neurological: He is alert.  Skin: Skin is warm and dry.  Psychiatric: His speech is normal and behavior is normal. Thought content normal. His mood appears anxious. Cognition and memory are normal. He expresses impulsivity.    Review of Systems  Constitutional: Negative.   HENT: Negative.   Eyes: Negative.   Respiratory: Negative.   Cardiovascular: Negative.   Gastrointestinal: Negative.   Musculoskeletal: Negative.   Skin: Negative.   Neurological: Negative.   Psychiatric/Behavioral: Negative  for depression, hallucinations, memory loss, substance abuse and suicidal ideas. The patient is nervous/anxious. The patient does not have insomnia.     Blood pressure 114/82, pulse 81, temperature 98 F (36.7 C), temperature source Oral, resp. rate 18, height 6\' 1"  (1.854 m), weight 85.7 kg (189 lb), SpO2 98 %.Body mass index is 24.94 kg/m.  General Appearance: Casual  Eye Contact:  Good  Speech:  Normal Rate  Volume:  Normal  Mood:  Anxious  Affect:  Congruent  Thought Process:  Goal Directed  Orientation:  Full (Time, Place, and Person)  Thought Content:  Logical  Suicidal Thoughts:  No  Homicidal Thoughts:  No  Memory:  Immediate;   Good Recent;   Fair Remote;   Fair  Judgement:  Fair  Insight:  Fair  Psychomotor Activity:  Decreased  Concentration:  Concentration: Fair  Recall:  [employer] of Knowledge:  Fair  Language:  Fair  Akathisia:  No  Handed:  Right  AIMS (if indicated):     Assets:  Communication Skills Desire for Improvement Physical Health Resilience  ADL's:  Intact  Cognition:  WNL  Sleep:  Number of Hours: 6.25     Treatment Plan Summary: Daily contact with patient to assess and evaluate symptoms and progress in treatment, Medication management  and Plan Patient is complaining of side effects from his medicine in his opinion. He says he is feeling sick to his stomach. He makes some suggestions requesting benzodiazepines but mostly seems to just think that the lithium is upsetting his stomach. Patient will be encouraged to tolerate current medicine for a while. I will cut down the dose by a little bit in follow-up and see if he is feeling any better. Psychoeducation completed to make sure he understands that it'll take a while for anything to work.  Mordecai Rasmussen, MD 10/17/2016, 2:59 PM

## 2016-10-17 NOTE — Progress Notes (Signed)
D: Patient stated slept poor last night .Stated appetite is good and energy level  Low l. Stated concentration poor. Stated on Depression scale 10 , hopeless 9 and anxiety 10 .( low 0-10 high) Denies suicidal  homicidal ideations  .  No auditory hallucinations  . Appropriate ADL'S.Limited  Interacting with peers and staff when out of room .  Patient stayed closed  To bed felt like a bug/ vi rise  A: Encourage patient participation with unit programming . Instruction  Given on  Medication , verbalize understanding. R: Voice no other concerns. Staff continue to monitor

## 2016-10-17 NOTE — B[facility] Group Notes (Signed)
[facility] LCSW Group Therapy   10/17/2016  1pm  Type of Therapy: Group Therapy   Participation Level: Did Not Attend. Patient invited to participate but declined.    Sidrah Harden LCSW [facility]  

## 2016-10-18 MED ORDER — PROMETHAZINE HCL 25 MG PO TABS
25.0000 mg | ORAL_TABLET | Freq: Four times a day (QID) | ORAL | Status: DC | PRN
  Administered 2016-10-18 – 2016-10-19 (×2): 25 mg via ORAL
  Filled 2016-10-18 (×2): qty 1

## 2016-10-18 NOTE — Progress Notes (Signed)
Pt denies SI/HI/AVH.  Pt expressed concern that one of the medications he was on was making him feel "nauseous and bad during the day". Medication compliant and wishes to speak to MD regarding medication orders.  Attended evening group. Appropriate during interaction. Denies pain and voices no additional concerns at this time. Safety maintained. Will continue to monitor

## 2016-10-18 NOTE — Progress Notes (Signed)
Patient reports sleeping fair last pm with sleep medication. Reports medication did not help much. Reports appetite is fair energy level low and concentration is poor. Reports anxiety, depression and hopelessness at 10. Patient having generalized pain.  Pt goal is to get better, but reports "It aint gone happen here, I feel like a goddam [place]". Pt also request medicine for nausea. Encouragement and support offered. Medications given as prescribed. Prn given with partial relief. Safety checks maintained. Pt did attend groups and was medication compliant. Appropriate with staff and peers. Denies SI, HI, AVH. Pt remains safe with q15 min checks.

## 2016-10-18 NOTE — Progress Notes (Signed)
[facility]  10/18/2016 2:08 PM [name]  MRN:  [id number] Subjective:  "I'm still feeling really nervous" this is a 41 year old man with a history of anxiety and mood instability. Presented to the hospital with suicidal thoughts irritability and anxiety. Currently being treated for a day. He is still complaining of being anxious and nervous much of the time. Somewhat intrusive but does not appear to be clearly psychotic. Today he is complaining of being sick to his stomach. He says he thinks the medicine is causing that. He seems to be very focused on somatic issues.  Patient seen for follow-up on the 14th. Patient is a nonstop source of complaints about his anxiety. He talks about how bad his nerves are. He cannot identify anything in particular he is worried about. Seems to be a little hyperactive. Denies any suicidal or homicidal thoughts although at times he will talk about how he thinks he will "go off" if he doesn't get more nerve medicine. Patient told me that he felt like the medicine was causing him him to be more nervous. Also talked about how he felt like he was a "[place]" on the other hand all he wants is to have more medicines given to him. Principal Problem: Bipolar 2 disorder (HCC) Diagnosis:   Patient Active Problem List   Diagnosis Date Noted  . Tobacco use disorder [F17.200] 10/16/2016  . Bipolar 2 disorder (HCC) [F31.81] 10/15/2016  . Cannabis use disorder, moderate, dependence (HCC) [F12.20] 10/15/2016  . Suicidal ideation [R45.851] 10/15/2016   Total Time spent with patient: 30 minutes  Past Psychiatric History: No significant past history.  Past Medical History:  Past Medical History:  Diagnosis Date  . Depression   . Sinus congestion     Past Surgical History:  Procedure Laterality Date  . ANKLE SURGERY    . HERNIA REPAIR    . HERNIA REPAIR    . TENDON REPAIR     Family History: History reviewed. No pertinent family history. Family  Psychiatric  History: None Social History:  History  Alcohol Use  . 7.2 oz/week  . 12 Cans of beer per week    Comment: reports weekend use (fri/sat night)     History  Drug Use  . Frequency: 3.0 times per week  . Types: Marijuana    Comment: uses 2-3 times weekly since 41 years old    Social History   Social History  . Marital status: Single    Spouse name: N/A  . Number of children: N/A  . Years of education: N/A   Social History Main Topics  . Smoking status: Current Every Day Smoker    Packs/day: 1.00    Types: Cigarettes  . Smokeless tobacco: Never Used  . Alcohol use 7.2 oz/week    12 Cans of beer per week     Comment: reports weekend use (fri/sat night)  . Drug use:     Frequency: 3.0 times per week    Types: Marijuana     Comment: uses 2-3 times weekly since 41 years old  . Sexual activity: Not Asked   Other Topics Concern  . None   Social History Narrative  . None   Additional Social History:    History of alcohol / drug use?: Yes Negative Consequences of Use: Financial, Armed forces operational officerLegal, Personal relationships, Work / School                    Sleep: Fair  Appetite:  Fair  Current Medications: Current Facility-Administered Medications  Medication Dose Route Frequency Provider Last Rate Last Dose  . acetaminophen (TYLENOL) tablet 650 mg  650 mg Oral Q6H PRN Audery Amel, MD   650 mg at 10/18/16 1221  . alum & mag hydroxide-simeth (MAALOX/MYLANTA) 200-200-20 MG/5ML suspension 30 mL  30 mL Oral Q4H PRN Audery Amel, MD      . fluvoxaMINE (LUVOX) tablet 50 mg  50 mg Oral QHS Shari Prows, MD   50 mg at 10/16/16 2143  . fluvoxaMINE (LUVOX) tablet 50 mg  50 mg Oral QHS Jolanta B Pucilowska, MD   50 mg at 10/17/16 2237  . hydrOXYzine (ATARAX/VISTARIL) tablet 50 mg  50 mg Oral Q4H PRN Shari Prows, MD   50 mg at 10/16/16 1214  . lithium carbonate capsule 300 mg  300 mg Oral BID WC Audery Amel, MD   300 mg at 10/18/16 0849  . magnesium  hydroxide (MILK OF MAGNESIA) suspension 30 mL  30 mL Oral Daily PRN Audery Amel, MD      . nicotine (NICODERM CQ - dosed in mg/24 hours) patch 21 mg  21 mg Transdermal Daily Jolanta B Pucilowska, MD   21 mg at 10/18/16 0849  . promethazine (PHENERGAN) tablet 25 mg  25 mg Oral Q6H PRN Audery Amel, MD   25 mg at 10/18/16 1350  . QUEtiapine (SEROQUEL) tablet 200 mg  200 mg Oral QHS Jolanta B Pucilowska, MD   200 mg at 10/17/16 2238    Lab Results:  No results found for this or any previous visit (from the past 48 hour(s)).  Blood Alcohol level:  Lab Results  Component Value Date   ETH <5 10/14/2016    Metabolic Disorder Labs: Lab Results  Component Value Date   HGBA1C 5.6 10/16/2016   MPG 114 10/16/2016   Lab Results  Component Value Date   PROLACTIN 13.9 10/16/2016   Lab Results  Component Value Date   CHOL 171 10/16/2016   TRIG 146 10/16/2016   HDL 52 10/16/2016   CHOLHDL 3.3 10/16/2016   VLDL 29 10/16/2016   LDLCALC 90 10/16/2016    Physical Findings: AIMS: Facial and Oral Movements Muscles of Facial Expression: None, normal Lips and Perioral Area: None, normal Jaw: None, normal Tongue: None, normal,Extremity Movements Upper (arms, wrists, hands, fingers): None, normal Lower (legs, knees, ankles, toes): None, normal, Trunk Movements Neck, shoulders, hips: None, normal, Overall Severity Severity of abnormal movements (highest score from questions above): None, normal Incapacitation due to abnormal movements: None, normal Patient's awareness of abnormal movements (rate only patient's report): No Awareness, Dental Status Current problems with teeth and/or dentures?: No Does patient usually wear dentures?: No  CIWA:    COWS:     Musculoskeletal: Strength & Muscle Tone: within normal limits Gait & Station: normal Patient leans: N/A  Psychiatric Specialty Exam: Physical Exam  Constitutional: He appears well-developed and well-nourished.  HENT:  Head:  Normocephalic and atraumatic.  Eyes: Conjunctivae are normal. Pupils are equal, round, and reactive to light.  Neck: Normal range of motion.  Cardiovascular: Normal heart sounds.   Respiratory: Effort normal.  GI: Soft.  Musculoskeletal: Normal range of motion.  Neurological: He is alert.  Skin: Skin is warm and dry.  Psychiatric: His speech is normal and behavior is normal. Thought content normal. His mood appears anxious. Cognition and memory are normal. He expresses impulsivity.    Review of Systems  Constitutional: Negative.   HENT:  Negative.   Eyes: Negative.   Respiratory: Negative.   Cardiovascular: Negative.   Gastrointestinal: Negative.   Musculoskeletal: Negative.   Skin: Negative.   Neurological: Negative.   Psychiatric/Behavioral: Negative for depression, hallucinations, memory loss, substance abuse and suicidal ideas. The patient is nervous/anxious. The patient does not have insomnia.     Blood pressure 114/82, pulse 81, temperature 98 F (36.7 C), temperature source Oral, resp. rate 18, height 6\' 1"  (1.854 m), weight 85.7 kg (189 lb), SpO2 98 %.Body mass index is 24.94 kg/m.  General Appearance: Casual  Eye Contact:  Good  Speech:  Normal Rate  Volume:  Normal  Mood:  Anxious  Affect:  Congruent  Thought Process:  Goal Directed  Orientation:  Full (Time, Place, and Person)  Thought Content:  Logical  Suicidal Thoughts:  No  Homicidal Thoughts:  No  Memory:  Immediate;   Good Recent;   Fair Remote;   Fair  Judgement:  Fair  Insight:  Fair  Psychomotor Activity:  Decreased  Concentration:  Concentration: Fair  Recall:  [employer] of Knowledge:  Fair  Language:  Fair  Akathisia:  No  Handed:  Right  AIMS (if indicated):     Assets:  Communication Skills Desire for Improvement Physical Health Resilience  ADL's:  Intact  Cognition:  WNL  Sleep:  Number of Hours: 6     Treatment Plan Summary: Daily contact with patient to assess and evaluate  symptoms and progress in treatment, Medication management and Plan Tried indicate and educate the patient about how his symptoms are about the same as they were before coming in and so it's unlikely his medicines are causing his problems. When it out to him that despite his belief that he is on "nothing for depression" he is actually taking 3 different medicines to treat depression. Encouraged him to attend groups and try to calm down and stop focusing on benzodiazepines.  Vincent Rasmussen, MD 10/18/2016, 2:08 PM

## 2016-10-18 NOTE — [facility] Suicide Risk Assessment (Signed)
[facility] INPATIENT:  Family/Significant Other Suicide Prevention Education  Suicide Prevention Education:  Education Completed;Darlene Earlene PlaterDavis (mother [phone]), has been identified by the patient as the family member/significant other with whom the patient will be residing, and identified as the person(s) who will aid the patient in the event of a mental health crisis (suicidal ideations/suicide attempt).  With written consent from the patient, the family member/significant other has been provided the following suicide prevention education, prior to the and/or following the discharge of the patient.  The suicide prevention education provided includes the following:  Suicide risk factors  Suicide prevention and interventions  National Suicide Hotline telephone number  [facility] assessment telephone number  [facility] Emergency Assistance 911  [facility] and/or [facility]idential Mobile Crisis Unit telephone number  Request made of family/significant other to:  Remove weapons (e.g., guns, rifles, knives), all items previously/currently identified as safety concern.    Remove drugs/medications (over-the-counter, prescriptions, illicit drugs), all items previously/currently identified as a safety concern.  The family member/significant other verbalizes understanding of the suicide prevention education information provided.  The family member/significant other agrees to remove the items of safety concern listed above.  Myrl Bynum G. Garnette CzechSampson MSW, LCSWA 10/18/2016, 1:55 PM

## 2016-10-18 NOTE — Plan of Care (Signed)
Problem: Activity: Goal: Interest or engagement in leisure activities will improve Outcome: Progressing Visible in dayroom socializing with staff and peers appropriately during interaction. Safety maintained. Will continue to monitor.
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
## 2016-10-18 NOTE — Plan of Care (Signed)
Problem: Education: Goal: Mental status will improve Outcome: Not Progressing Patient reports continuing to feel depressed.

## 2016-10-18 NOTE — [facility] Group Notes (Signed)
[facility] Group Notes:  (Nursing/MHT/Case Management/Adjunct)  Date:  10/18/2016  Time:  4:53 AM  Type of Therapy:  Psychoeducational Skills  Participation Level:  Active  Participation Quality:  Appropriate and Sharing  Affect:  Appropriate  Cognitive:  Appropriate  Insight:  Appropriate and Good  Engagement in Group:  Engaged  Modes of Intervention:  Discussion, Socialization and Support  Summary of Progress/Problems:  Vincent MilroyLaquanda Y Blakeley Rosales 10/18/2016, 4:53 AM
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
## 2016-10-18 NOTE — [facility] Group Notes (Signed)
[facility] LCSW Group Therapy  10/18/2016 1 pm  Type of Therapy:  Group Therapy  Participation Level:  Active  Participation Quality:  Appropriate, Attentive and Supportive  Affect:  Appropriate  Cognitive:  Oriented  Insight:  Developing/Improving  Engagement in Therapy:  Engaged  Modes of Intervention:  Discussion, Education, Problem-solving, Socialization and Support  Summary of Progress/Problems:Balance in Life This group will address the concept of balance and how it feels and looks when one is unbalanced. Patients will be encouraged to process areas in their lives that are out of balance, and identify reasons for remaining unbalanced. Facilitators will guide patients utilizing problem- solving interventions to address and correct the stressor making their life unbalanced. Understanding and applying boundaries will be explored and addressed for obtaining and maintaining a balanced life. Patients will be encouraged to explore ways to assertively make their unbalanced needs known to significant others in their lives, using other group members and facilitator for support and feedback. This patient was able to reflect and share things- several things contribute to imbalance.  Patient needed gentle redirection and spoke out of turn often, pressured speech. He reported he does not like it when people tell him what to do or doctors who don't listen. He reports this leads to him not being balanced.He agrees that hospital stays sometimes help and taking medications help too. Vincent SenateBandi, [name] 10/18/2016, 2:23 PM

## 2016-10-19 LAB — LITHIUM LEVEL: Lithium Lvl: 0.41 mmol/L — ABNORMAL LOW (ref 0.60–1.20)

## 2016-10-19 MED ORDER — QUETIAPINE FUMARATE 200 MG PO TABS: 400.0000 mg | ORAL_TABLET | Freq: Every day | ORAL | Status: DC

## 2016-10-19 MED ORDER — LITHIUM CARBONATE ER 300 MG PO TBCR: 300.0000 mg | EXTENDED_RELEASE_TABLET | Freq: Two times a day (BID) | ORAL | 1 refills | Status: DC

## 2016-10-19 MED ORDER — FLUVOXAMINE MALEATE 100 MG PO TABS: 200.0000 mg | ORAL_TABLET | Freq: Every day | ORAL | 1 refills | Status: DC

## 2016-10-19 MED ORDER — QUETIAPINE FUMARATE 400 MG PO TABS: 400.0000 mg | ORAL_TABLET | Freq: Every day | ORAL | 1 refills | Status: DC

## 2016-10-19 MED ORDER — FLUVOXAMINE MALEATE 50 MG PO TABS: 200.0000 mg | ORAL_TABLET | Freq: Every day | ORAL | Status: DC

## 2016-10-19 MED ORDER — FLUVOXAMINE MALEATE 50 MG PO TABS: 100.0000 mg | ORAL_TABLET | Freq: Every day | ORAL | Status: DC

## 2016-10-19 MED ORDER — LITHIUM CARBONATE ER 300 MG PO TBCR: 300.0000 mg | EXTENDED_RELEASE_TABLET | Freq: Two times a day (BID) | ORAL | Status: DC
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
## 2016-10-19 NOTE — [facility] Suicide Risk Assessment (Signed)
[facility]   Principal Problem: Bipolar 2 disorder [facility](HCC) Discharge Diagnoses:  Patient Active Problem List   Diagnosis Date Noted  . Tobacco use disorder [F17.200] 10/16/2016  . Bipolar 2 disorder (HCC) [F31.81] 10/15/2016  . Cannabis use disorder, moderate, dependence (HCC) [F12.20] 10/15/2016  . Suicidal ideation [R45.851] 10/15/2016    Total Time spent with patient: 30 minutes  Musculoskeletal: Strength & Muscle Tone: within normal limits Gait & Station: normal Patient leans: N/A  Psychiatric Specialty Exam: Review of Systems  Psychiatric/Behavioral: Positive for substance abuse. The patient is nervous/anxious.   All other systems reviewed and are negative.   Blood pressure 114/82, pulse 81, temperature 98 F (36.7 C), temperature source Oral, resp. rate 18, height 6\' 1"  (1.854 m), weight 85.7 kg (189 lb), SpO2 98 %.Body mass index is 24.94 kg/m.  General Appearance: Casual  Eye Contact::  Good  Speech:  Clear and Coherent409  Volume:  Normal  Mood:  Anxious  Affect:  Appropriate  Thought Process:  Goal Directed and Descriptions of Associations: Intact  Orientation:  Full (Time, Place, and Person)  Thought Content:  WDL  Suicidal Thoughts:  No  Homicidal Thoughts:  No  Memory:  Immediate;   Fair Recent;   Fair Remote;   Fair  Judgement:  Impaired  Insight:  Shallow  Psychomotor Activity:  Normal  Concentration:  Fair  Recall:  [employer]  Fund of Knowledge:Fair  Language: Fair  Akathisia:  No  Handed:  Right  AIMS (if indicated):     Assets:  Communication Skills Desire for Improvement Housing Physical Health Resilience Social Support  Sleep:  Number of Hours: 6  Cognition: WNL  ADL's:  Intact   Mental Status Per Nursing Assessment::   On Admission:  Suicidal ideation indicated by patient  Demographic Factors:  Male, Caucasian, Low socioeconomic status and Unemployed  Loss Factors: Decrease in vocational status and Financial  problems/change in socioeconomic status  Historical Factors: Prior suicide attempts, Family history of mental illness or substance abuse and Impulsivity  Risk Reduction Factors:   Sense of responsibility to family, Living with another person, especially a relative and Positive social support  Continued Clinical Symptoms:  Bipolar Disorder:   Bipolar II Depressive phase Alcohol/Substance Abuse/Dependencies  Cognitive Features That Contribute To Risk:  None    Suicide Risk:  Minimal: No identifiable suicidal ideation.  Patients presenting with no risk factors but with morbid ruminations; may be classified as minimal risk based on the severity of the depressive symptoms  Follow-up Information    [facility] Schedule an appointment as soon as possible for a visit.   Why:  CSW will call with appointment tomorrow.  [facility], unable to schedule prior to D/C. Contact information: [address] [place] [address] [phone]           Plan Of Care/Follow-up recommendations:  Activity:  As tolerated. Diet:  Regular. Other:  Keep follow-up appointments.  Kristine LineaJolanta Manvir Thorson, MD 10/19/2016, 10:42 AM

## 2016-10-19 NOTE — [facility] Group Notes (Signed)
[facility] LCSW Group Therapy Note  Date/Time: 10/19/16 0930  Type of Therapy and Topic:  Group Therapy:  Overcoming Obstacles  Participation Level:  Pt was invited but did not attend group.  Description of Group:    In this group patients will be encouraged to explore what they see as obstacles to their own wellness and recovery. They will be guided to discuss their thoughts, feelings, and behaviors related to these obstacles. The group will process together ways to cope with barriers, with attention given to specific choices patients can make. Each patient will be challenged to identify changes they are motivated to make in order to overcome their obstacles. This group will be process-oriented, with patients participating in exploration of their own experiences as well as giving and receiving support and challenge from other group members.  Therapeutic Goals: 1. Patient will identify personal and current obstacles as they relate to admission. 2. Patient will identify barriers that currently interfere with their wellness or overcoming obstacles.  3. Patient will identify feelings, thought process and behaviors related to these barriers. 4. Patient will identify two changes they are willing to make to overcome these obstacles:    Summary of Patient Progress    Therapeutic Modalities:   Cognitive Behavioral Therapy Solution Focused Therapy Motivational Interviewing Relapse Prevention Therapy  Daleen SquibbGreg Shawntel Farnworth, LCSW

## 2016-10-19 NOTE — Progress Notes (Signed)
Recreation Therapy Notes  INPATIENT RECREATION TR PLAN  Patient Details Name: [name] MRN: [id number] DOB: 1976-09-17 Today's Date: 10/19/2016  Rec Therapy Plan Is patient appropriate for Therapeutic Recreation?: Yes Treatment times per week: At least once a week TR Treatment/Interventions: 1:1 session, Group participation (Comment) (Appropriate participation in daily recreational therapy tx)  Discharge Criteria Pt will be discharged from therapy if:: Treatment goals are met, Discharged Treatment plan/goals/alternatives discussed and agreed upon by:: Patient/family  Discharge Summary Short term goals set: See Care Plan Short term goals met: Complete Progress toward goals comments: One-to-one attended Which groups?: Social skills One-to-one attended: Self-esteem, stress management Reason goals not met: N/A Therapeutic equipment acquired: None Reason patient discharged from therapy: Discharge from hospital Pt/family agrees with progress & goals achieved: Yes Date patient discharged from therapy: 10/19/16   Leonette Monarch, LRT/CTRS 10/19/2016, 2:58 PM

## 2016-10-19 NOTE — Plan of Care (Signed)
Problem: [facility] Participation in Recreation Therapeutic Interventions Goal: STG-Patient will demonstrate improved self esteem by identif STG: Self-Esteem - Within 3 treatment sessions, patient will verbalize at least 5 positive affirmation statements in one treatment session to increase self-esteem post d/c.  Outcome: Completed/Met Date Met: 10/19/16 Treatment Session 1; Completed 1 out of 1: At approximately 11:20 am, LRT met with patient in craft room. Patient verbalized 5 positive affirmation statements. Patient reported it felt "pretty good". LRT encouraged patient to continue saying positive affirmation statements.  Leonette Monarch, LRT/CTRS 01.15.18 11:34 am Goal: STG-Other Recreation Therapy Goal (Specify) STG: Stress Management - Within 3 treatment sessions, patient will verbalize understanding of the stress management techniques in one treatment session to increase stress management skills post d/c.  Outcome: Completed/Met Date Met: 10/19/16 Treatment Session 1; Completed 1 out of 1: At approximately 11:20 am, LRT met with patient in craft room. LRT educated and provided patient with handouts on stress management techniques. Patient verbalized understanding. LRT encouraged patient to read over and practice the stress management techniques.  Leonette Monarch, LRT/CTRS 01.15.18 11:35 am

## 2016-10-19 NOTE — Progress Notes (Signed)
  [facility] :  Will you be returning to the same living situation after discharge:  No. At discharge, do you have transportation home?: Yes,  family Do you have the ability to pay for your medications: No. Referral sent to medication mgmt clinic.  Release of information consent forms completed and in the chart;  Patient's signature needed at discharge.  Patient to Follow up at: Follow-up Information    [facility] Schedule an appointment as soon as possible for a visit.   Why:  CSW will call with appointment tomorrow.  [facility] to [facility], unable to schedule prior to D/C. Contact information: [address] [place] [address] [phone]        [facility] Go on 10/23/2016.   Why:  Please go to [facility] at 230pm on Friday, 10/23/16.   Please bring photo ID and hospital discharge paperwork.  Any questions, call Unk PintoHarvey Bryant at [phone] Contact information: [address] [place] [address] [phone]           Next level of care provider has access to [facility] Link:no  Safety Planning and Suicide Prevention discussed: Yes.  Pt only.  Have you used any form of tobacco in the last 30 days? (Cigarettes, Smokeless Tobacco, Cigars, and/or Pipes): Yes  Has patient been referred to the Quitline?: Yes, faxed on 10/19/16  Patient has been referred for addiction treatment: Yes  Lorri FrederickWierda, Odean Fester Jon, LCSW 10/19/2016, 1:47 PM

## 2016-10-19 NOTE — Progress Notes (Signed)
Pt awake, alert, oriented and up on unit this morning. Appears fidgety with decreased concentration. Speech slurred. Pt demanding discharge "because this place aint helping me any." Pt irritable, agitated, cursing and threatening staff. Noted to be discussing last night's events (see previous notes) with the pt's involved at breakfast. Reports fair sleep last night with the help of sleep medication, fair appetite, low energy, poor concentration. Rates depression 9/10, hopelessness 9/10, anxiety 9/10 (low 0-10 high). Denies SI/AVH. Per self inventory, pt reports his goal today is "nothing" by "dont even know." Also writes, "I want the fuck outta here yall such ass. Just wanna go the fuck home today."   Support and encouragement provided with use of therapeutic communication. Medications administered with education, pt is compliant. Safety maintained with every 15 minute checks. Will continue to monitor.

## 2016-10-19 NOTE — Progress Notes (Signed)
Provided and reviewed discharge paperwork and prescriptions. Verified understanding by use of teach back method. Verbalizes understanding as well. Denies SI/HI/AVH. Pt belongings returned as noted. Pt's mother on the way to pick up pt for transport home. Safety maintained. Will continue to monitor.

## 2017-01-21 ENCOUNTER — Encounter: Payer: Self-pay | Admitting: Emergency Medicine

## 2017-01-21 ENCOUNTER — Observation Stay
Admission: EM | Admit: 2017-01-21 | Discharge: 2017-01-22 | Disposition: A | Discharge status: Home / Self Care | Payer: Self-pay | Attending: Specialist | Admitting: Specialist

## 2017-01-21 DIAGNOSIS — E876 Hypokalemia: Secondary | ICD-10-CM | POA: Insufficient documentation

## 2017-01-21 DIAGNOSIS — F3181 Bipolar II disorder: Secondary | ICD-10-CM | POA: Insufficient documentation

## 2017-01-21 DIAGNOSIS — F17210 Nicotine dependence, cigarettes, uncomplicated: Secondary | ICD-10-CM | POA: Insufficient documentation

## 2017-01-21 DIAGNOSIS — B1920 Unspecified viral hepatitis C without hepatic coma: Secondary | ICD-10-CM | POA: Insufficient documentation

## 2017-01-21 DIAGNOSIS — F1110 Opioid abuse, uncomplicated: Secondary | ICD-10-CM | POA: Insufficient documentation

## 2017-01-21 DIAGNOSIS — Z79899 Other long term (current) drug therapy: Secondary | ICD-10-CM | POA: Insufficient documentation

## 2017-01-21 DIAGNOSIS — L03113 Cellulitis of right upper limb: Principal | ICD-10-CM | POA: Insufficient documentation

## 2017-01-21 HISTORY — DX: Unspecified viral hepatitis C without hepatic coma: B19.20

## 2017-01-21 LAB — COMPREHENSIVE METABOLIC PANEL
ALK PHOS: 60 U/L (ref 38–126)
ALT: 164 U/L — AB (ref 17–63)
AST: 129 U/L — ABNORMAL HIGH (ref 15–41)
Albumin: 3.8 g/dL (ref 3.5–5.0)
Anion gap: 8 (ref 5–15)
BUN: 15 mg/dL (ref 6–20)
CALCIUM: 8.6 mg/dL — AB (ref 8.9–10.3)
CO2: 27 mmol/L (ref 22–32)
CREATININE: 0.81 mg/dL (ref 0.61–1.24)
Chloride: 99 mmol/L — ABNORMAL LOW (ref 101–111)
GFR calc non Af Amer: >60 mL/min (ref >60)
Glucose, Bld: 122 mg/dL — ABNORMAL HIGH (ref 65–99)
Potassium: 3 mmol/L — ABNORMAL LOW (ref 3.5–5.1)
SODIUM: 134 mmol/L — AB (ref 135–145)
Total Bilirubin: 0.7 mg/dL (ref 0.3–1.2)
Total Protein: 6.9 g/dL (ref 6.5–8.1)

## 2017-01-21 LAB — CBC WITH DIFFERENTIAL/PLATELET
BASOS ABS: 0.1 10*3/uL (ref 0–0.1)
Basophils Relative: 1 %
EOS PCT: 5 %
Eosinophils Absolute: 0.3 10*3/uL (ref 0–0.7)
HCT: 37.7 % — ABNORMAL LOW (ref 40.0–52.0)
Hemoglobin: 13.2 g/dL (ref 13.0–18.0)
Lymphocytes Relative: 28 %
Lymphs Abs: 1.9 10*3/uL (ref 1.0–3.6)
MCH: 30.3 pg (ref 26.0–34.0)
MCHC: 35 g/dL (ref 32.0–36.0)
MCV: 86.5 fL (ref 80.0–100.0)
MONOS PCT: 18 %
Monocytes Absolute: 1.2 10*3/uL — ABNORMAL HIGH (ref 0.2–1.0)
NEUTROS PCT: 48 %
Neutro Abs: 3.3 10*3/uL (ref 1.4–6.5)
PLATELETS: 201 10*3/uL (ref 150–440)
RBC: 4.35 MIL/uL — ABNORMAL LOW (ref 4.40–5.90)
RDW: 13 % (ref 11.5–14.5)
WBC: 6.8 10*3/uL (ref 3.8–10.6)

## 2017-01-21 LAB — URINE DRUG SCREEN, QUALITATIVE (ARMC ONLY)
Amphetamines, Ur Screen: POSITIVE — AB
BARBITURATES, UR SCREEN: NOT DETECTED
BENZODIAZEPINE, UR SCRN: NOT DETECTED
Cannabinoid 50 Ng, Ur ~~LOC~~: POSITIVE — AB
Cocaine Metabolite,Ur ~~LOC~~: NOT DETECTED
MDMA (Ecstasy)Ur Screen: NOT DETECTED
METHADONE SCREEN, URINE: NOT DETECTED
Opiate, Ur Screen: POSITIVE — AB
Phencyclidine (PCP) Ur S: NOT DETECTED
TRICYCLIC, UR SCREEN: NOT DETECTED

## 2017-01-21 LAB — LACTIC ACID, PLASMA: LACTIC ACID, VENOUS: 1.2 mmol/L (ref 0.5–1.9)

## 2017-01-21 LAB — TSH: TSH: 0.673 u[IU]/mL (ref 0.350–4.500)

## 2017-01-21 MED ORDER — VANCOMYCIN HCL IN DEXTROSE 1-5 GM/200ML-% IV SOLN
1000.0000 mg | Freq: Once | INTRAVENOUS | Status: AC
  Administered 2017-01-21: 1000 mg via INTRAVENOUS
  Filled 2017-01-21: qty 200

## 2017-01-21 MED ORDER — CEPASTAT 14.5 MG MT LOZG
1.0000 | LOZENGE | OROMUCOSAL | Status: DC | PRN
  Administered 2017-01-21: 1 via BUCCAL
  Filled 2017-01-21: qty 9

## 2017-01-21 MED ORDER — VANCOMYCIN HCL 10 G IV SOLR: 1250.0000 mg | Freq: Three times a day (TID) | INTRAVENOUS | Status: DC

## 2017-01-21 MED ORDER — ONDANSETRON HCL 4 MG PO TABS: 4.0000 mg | ORAL_TABLET | Freq: Four times a day (QID) | ORAL | Status: DC | PRN

## 2017-01-21 MED ORDER — ACETAMINOPHEN 325 MG PO TABS
650.0000 mg | ORAL_TABLET | Freq: Four times a day (QID) | ORAL | Status: DC | PRN
  Administered 2017-01-21 (×2): 650 mg via ORAL
  Filled 2017-01-21 (×3): qty 2

## 2017-01-21 MED ORDER — QUETIAPINE FUMARATE 300 MG PO TABS: 400.0000 mg | ORAL_TABLET | Freq: Every day | ORAL | Status: DC

## 2017-01-21 MED ORDER — ONDANSETRON HCL 4 MG/2ML IJ SOLN
4.0000 mg | Freq: Four times a day (QID) | INTRAMUSCULAR | Status: DC | PRN
  Administered 2017-01-21: 4 mg via INTRAVENOUS
  Filled 2017-01-21: qty 2

## 2017-01-21 MED ORDER — FLUVOXAMINE MALEATE 50 MG PO TABS
200.0000 mg | ORAL_TABLET | Freq: Every day | ORAL | Status: DC
  Administered 2017-01-21: 200 mg via ORAL
  Filled 2017-01-21: qty 4

## 2017-01-21 MED ORDER — ENOXAPARIN SODIUM 40 MG/0.4ML ~~LOC~~ SOLN
40.0000 mg | SUBCUTANEOUS | Status: DC
  Administered 2017-01-21: 40 mg via SUBCUTANEOUS
  Filled 2017-01-21: qty 0.4

## 2017-01-21 MED ORDER — LORATADINE 10 MG PO TABS
10.0000 mg | ORAL_TABLET | Freq: Every day | ORAL | Status: DC
  Administered 2017-01-21: 10 mg via ORAL
  Filled 2017-01-21: qty 1

## 2017-01-21 MED ORDER — ACETAMINOPHEN 650 MG RE SUPP: 650.0000 mg | Freq: Four times a day (QID) | RECTAL | Status: DC | PRN

## 2017-01-21 MED ORDER — NICOTINE 21 MG/24HR TD PT24
21.0000 mg | MEDICATED_PATCH | Freq: Every day | TRANSDERMAL | Status: DC
  Administered 2017-01-21: 21 mg via TRANSDERMAL
  Filled 2017-01-21: qty 1

## 2017-01-21 MED ORDER — POTASSIUM CHLORIDE CRYS ER 20 MEQ PO TBCR
20.0000 meq | EXTENDED_RELEASE_TABLET | Freq: Two times a day (BID) | ORAL | Status: DC
  Administered 2017-01-21 (×2): 20 meq via ORAL
  Filled 2017-01-21 (×2): qty 1

## 2017-01-21 MED ORDER — PIPERACILLIN-TAZOBACTAM 3.375 G IVPB: 3.3750 g | Freq: Three times a day (TID) | INTRAVENOUS | Status: DC

## 2017-01-21 MED ORDER — PROCHLORPERAZINE EDISYLATE 5 MG/ML IJ SOLN
5.0000 mg | INTRAMUSCULAR | Status: DC | PRN
  Filled 2017-01-21: qty 1

## 2017-01-21 MED ORDER — PIPERACILLIN-TAZOBACTAM 3.375 G IVPB 30 MIN
3.3750 g | Freq: Once | INTRAVENOUS | Status: AC
  Administered 2017-01-21: 3.375 g via INTRAVENOUS
  Filled 2017-01-21: qty 50

## 2017-01-21 MED ORDER — PNEUMOCOCCAL VAC POLYVALENT 25 MCG/0.5ML IJ INJ: 0.5000 mL | INJECTION | INTRAMUSCULAR | Status: DC

## 2017-01-21 MED ORDER — LITHIUM CARBONATE ER 300 MG PO TBCR
300.0000 mg | EXTENDED_RELEASE_TABLET | Freq: Two times a day (BID) | ORAL | Status: DC
  Filled 2017-01-21: qty 1

## 2017-01-21 MED ORDER — POTASSIUM CHLORIDE IN NACL 40-0.9 MEQ/L-% IV SOLN
INTRAVENOUS | Status: DC
  Administered 2017-01-21 – 2017-01-22 (×4): 125 mL/h via INTRAVENOUS
  Filled 2017-01-21 (×6): qty 1000

## 2017-01-21 MED ORDER — CEPHALEXIN 500 MG PO CAPS
500.0000 mg | ORAL_CAPSULE | Freq: Four times a day (QID) | ORAL | Status: DC
  Administered 2017-01-21 – 2017-01-22 (×3): 500 mg via ORAL
  Filled 2017-01-21 (×4): qty 1

## 2017-01-21 MED ORDER — DOCUSATE SODIUM 100 MG PO CAPS
100.0000 mg | ORAL_CAPSULE | Freq: Two times a day (BID) | ORAL | Status: DC
  Administered 2017-01-21: 100 mg via ORAL
  Filled 2017-01-21 (×2): qty 1

## 2017-01-21 NOTE — ED Notes (Signed)
Gave patient specimen cup for urine. Unable to go at this time

## 2017-01-21 NOTE — H&P (Signed)
[name] is an 41 y.o. male.   Chief Complaint: Arm swelling HPI: The patient with past medical history of depression, drug abuse and reportedly hepatitis C infection presents emergency department complaining of pain and swelling of his right arm. The patient states that he notices peeling somewhat red and tender around his right wrist 3 days ago but did not think much of it. The arm is progressively become more painful. He notes that approximately 3 weeks ago he cut the first finger of his left hand which became tremendously swollen and has now reduced somewhat, but he is still unable to make a fist due to swelling in that digit. The patient states that he has not injected heroin in approximately 6 months. He denies fevers, nausea and vomiting. The patient's arm was obviously swollen and erythematous which prompted emergency department staff to give vancomycin and Zosyn prior to calling the hospitalist service for further management.  Past Medical History:  Diagnosis Date  . Depression   . Hepatitis C   . Sinus congestion     Past Surgical History:  Procedure Laterality Date  . ANKLE SURGERY    . HERNIA REPAIR    . HERNIA REPAIR    . TENDON REPAIR      Family History  Problem Relation Age of Onset  . Heart failure Mother    Social History:  reports that he has been smoking Cigarettes.  He has been smoking about 1.00 pack per day. He has never used smokeless tobacco. He reports that he drinks about 7.2 oz of alcohol per week . He reports that he uses drugs, including Marijuana, about 3 times per week.  Allergies: No Known Allergies  Prior to Admission medications   Medication Sig Start Date End Date Taking? Authorizing Provider  fluvoxaMINE (LUVOX) 100 MG tablet Take 2 tablets (200 mg total) by mouth at bedtime. Patient not taking: Reported on 01/21/2017 10/19/16   Clovis Fredrickson, MD  lithium carbonate (LITHOBID) 300 MG CR tablet Take 1 tablet (300 mg total) by mouth every 12  (twelve) hours. Patient not taking: Reported on 01/21/2017 10/19/16   Clovis Fredrickson, MD  QUEtiapine (SEROQUEL) 400 MG tablet Take 1 tablet (400 mg total) by mouth at bedtime. Patient not taking: Reported on 01/21/2017 10/19/16   Clovis Fredrickson, MD     Results for orders placed or performed during the hospital encounter of 01/21/17 (from the past 48 hour(s))  Comprehensive metabolic panel     Status: Abnormal   Collection Time: 01/21/17  3:34 AM  Result Value Ref Range   Sodium 134 (L) 135 - 145 mmol/L   Potassium 3.0 (L) 3.5 - 5.1 mmol/L   Chloride 99 (L) 101 - 111 mmol/L   CO2 27 22 - 32 mmol/L   Glucose, Bld 122 (H) 65 - 99 mg/dL   BUN 15 6 - 20 mg/dL   Creatinine, Ser 0.81 0.61 - 1.24 mg/dL   Calcium 8.6 (L) 8.9 - 10.3 mg/dL   Total Protein 6.9 6.5 - 8.1 g/dL   Albumin 3.8 3.5 - 5.0 g/dL   AST 129 (H) 15 - 41 U/L   ALT 164 (H) 17 - 63 U/L   Alkaline Phosphatase 60 38 - 126 U/L   Total Bilirubin 0.7 0.3 - 1.2 mg/dL   GFR calc non Af Amer >60 >60 mL/min   GFR calc Af Amer >60 >60 mL/min    Comment: (NOTE) The eGFR has been calculated using the CKD EPI  equation. This calculation has not been validated in all clinical situations. eGFR's persistently <60 mL/min signify possible Chronic Kidney Disease.    Anion gap 8 5 - 15  CBC WITH DIFFERENTIAL     Status: Abnormal   Collection Time: 01/21/17  3:34 AM  Result Value Ref Range   WBC 6.8 3.8 - 10.6 K/uL   RBC 4.35 (L) 4.40 - 5.90 MIL/uL   Hemoglobin 13.2 13.0 - 18.0 g/dL   HCT 37.7 (L) 40.0 - 52.0 %   MCV 86.5 80.0 - 100.0 fL   MCH 30.3 26.0 - 34.0 pg   MCHC 35.0 32.0 - 36.0 g/dL   RDW 13.0 11.5 - 14.5 %   Platelets 201 150 - 440 K/uL   Neutrophils Relative % 48 %   Neutro Abs 3.3 1.4 - 6.5 K/uL   Lymphocytes Relative 28 %   Lymphs Abs 1.9 1.0 - 3.6 K/uL   Monocytes Relative 18 %   Monocytes Absolute 1.2 (H) 0.2 - 1.0 K/uL   Eosinophils Relative 5 %   Eosinophils Absolute 0.3 0 - 0.7 K/uL   Basophils  Relative 1 %   Basophils Absolute 0.1 0 - 0.1 K/uL  Lactic acid, plasma     Status: None   Collection Time: 01/21/17  3:34 AM  Result Value Ref Range   Lactic Acid, Venous 1.2 0.5 - 1.9 mmol/L  Urine Drug Screen, Qualitative ([facility] only)     Status: Abnormal   Collection Time: 01/21/17  4:59 AM  Result Value Ref Range   Tricyclic, Ur Screen NONE DETECTED NONE DETECTED   Amphetamines, Ur Screen POSITIVE (A) NONE DETECTED   MDMA (Ecstasy)Ur Screen NONE DETECTED NONE DETECTED   Cocaine Metabolite,Ur [place] NONE DETECTED NONE DETECTED   Opiate, Ur Screen POSITIVE (A) NONE DETECTED   Phencyclidine (PCP) Ur S NONE DETECTED NONE DETECTED   Cannabinoid 50 Ng, Ur [place] POSITIVE (A) NONE DETECTED   Barbiturates, Ur Screen NONE DETECTED NONE DETECTED   Benzodiazepine, Ur Scrn NONE DETECTED NONE DETECTED   Methadone Scn, Ur NONE DETECTED NONE DETECTED    Comment: (NOTE) 923  Tricyclics, urine               Cutoff 1000 ng/mL 200  Amphetamines, urine             Cutoff 1000 ng/mL 300  MDMA (Ecstasy), urine           Cutoff 500 ng/mL 400  Cocaine Metabolite, urine       Cutoff 300 ng/mL 500  Opiate, urine                   Cutoff 300 ng/mL 600  Phencyclidine (PCP), urine      Cutoff 25 ng/mL 700  Cannabinoid, urine              Cutoff 50 ng/mL 800  Barbiturates, urine             Cutoff 200 ng/mL 900  Benzodiazepine, urine           Cutoff 200 ng/mL 1000 Methadone, urine                Cutoff 300 ng/mL 1100 1200 The urine drug screen provides only a preliminary, unconfirmed 1300 analytical test result and should not be used for non-medical 1400 purposes. Clinical consideration and professional judgment should 1500 be applied to any positive drug screen result due to possible 1600 interfering substances. A more specific alternate chemical method 1700  must be used in order to obtain a confirmed analytical result.  1800 Gas chromato graphy / mass spectrometry (GC/MS) is the preferred 1900 confirmatory  method.    No results found.  Review of Systems  Constitutional: Negative for chills and fever.  HENT: Negative for sore throat and tinnitus.   Eyes: Negative for blurred vision and redness.  Respiratory: Negative for cough and shortness of breath.   Cardiovascular: Negative for chest pain, palpitations, orthopnea and PND.  Gastrointestinal: Negative for abdominal pain, diarrhea, nausea and vomiting.  Genitourinary: Negative for dysuria, frequency and urgency.  Musculoskeletal: Positive for joint pain. Negative for myalgias.  Skin: Negative for rash.       No lesions  Neurological: Negative for speech change, focal weakness and weakness.  Endo/Heme/Allergies: Does not bruise/bleed easily.       No temperature intolerance  Psychiatric/Behavioral: Negative for depression and suicidal ideas.    Blood pressure 119/72, pulse 75, temperature 98.5 F (36.9 C), temperature source Oral, resp. rate 20, height _0  (1.854 m), weight 87.5 kg (193 lb), SpO2 100 %. Physical Exam  Constitutional: He is oriented to person, place, and time. He appears well-developed and well-nourished. No distress.  HENT:  Head: Normocephalic and atraumatic.  Mouth/Throat: Oropharynx is clear and moist.  Eyes: Conjunctivae and EOM are normal. Pupils are equal, round, and reactive to light. No scleral icterus.  Neck: Normal range of motion. Neck supple. No JVD present. No tracheal deviation present. No thyromegaly present.  Cardiovascular: Normal rate, regular rhythm and normal heart sounds.  Exam reveals no gallop and no friction rub.   No murmur heard. Respiratory: Effort normal and breath sounds normal. No respiratory distress.  GI: Soft. Bowel sounds are normal. He exhibits no distension. There is no tenderness.  Genitourinary:  Genitourinary Comments: Deferred  Musculoskeletal: Normal range of motion. He exhibits no edema.  Lymphadenopathy:    He has no cervical adenopathy.  Neurological: He is alert and  oriented to person, place, and time. No cranial nerve deficit.  Impaired due to drug use  Skin: Skin is warm and dry. No rash noted. There is erythema. No pallor.  Psychiatric: He has a normal mood and affect. His behavior is normal. Judgment and thought content normal.     Assessment/Plan This is a 41 year old male admitted for cellulitis of the right arm and left first finger. 1. Cellulitis: Non-purulent. The patient has no signs or symptoms of sepsis or systemic illness. Erythema has dramatically improved following one dose of vancomycin. I have discontinued IV antibiotics and started the patient on Keflex. 2. Heroin abuse: The patient is currently under the influence. He has no new track marks nor does he have heart murmur. He is afebrile. No concern for endocarditis at this time. 3. Depression: Continue Luvox, lithium and Seroquel 4. Hep C: Workup reportedly scheduled to begin later this month 5. DVT prophylaxis: Lovenox 6. GI prophylaxis: None The patient is a full code. Time spent on admission orders and patient care approximately 45 minutes  Harrie Foreman, MD 01/21/2017, 7:00 AM

## 2017-01-21 NOTE — Clinical Social Work Note (Addendum)
Clinical Social Work Assessment  Patient Details  Name: [name] MRN: [id number] Date of Birth: 03-23-1976  Date of referral:  01/21/17               Reason for consult:  Substance Use/ETOH Abuse                Permission sought to share information with:    Permission granted to share information::     Name::        Agency::     Relationship::     Contact Information:     Housing/Transportation Living arrangements for the past 2 months:  Single Family Home Source of Information:  Patient Patient Interpreter Needed:  None Criminal Activity/Legal Involvement Pertinent to Current Situation/Hospitalization:  No - Comment as needed Significant Relationships:  Parents Lives with:  Parents Do you feel safe going back to the place where you live?  Yes Need for family participation in patient care:  Yes (Comment)  Care giving concerns:  Patient lives at home in [place], [place] with his mom and stepdad.     Social Worker assessment / plan:  Social work Theatre manager received substance abuse consult. Social work Solicitor ([place]) met with patient at bedside. Social work Theatre manager and CSW introduced selves and explained role of social work department. Patient was laying in bed watching TV. Per patient, he lives in [place] with his mom and stepdad. Patient reported that he does not work and he has one 70 year old daughter but does not live with daughter. Per patient, he recently used heroin and stated that he "smokes pot all the time and will never stop". Patient stated that he seeks drugs occasionally and appears to ease anxiety symptoms. Patient stated that he has been to several treatment centers before in the past, but said "they all suck." Patient stated he has been to [facility] and [facility] before in [place] and received treatment once before when he was in prison. Patient reported that he does not see a psychiatrist and is not interested in seeing one. CSW and social work Theatre manager left  a [employer] of substance abuse and community resources in patient's room. CSW spoke about [facility] in [place]. Patient stated he would look into services at [facility]. Social work Theatre manager and CSW provided emotional support and explained the risk of IV drug use. Plan is for patient to discharge tomorrow depending on medical clearance. Social work Theatre manager and [place] will continue to assist and follow as needed.   Employment status:  Unemployed Forensic scientist:  Self Pay (Medicaid Pending) PT Recommendations:  Not assessed at this time Information / Referral to community resources:  Outpatient Substance Abuse Treatment Options  Patient/Family's Response to care: Patient stated he would look in to services at [facility].   Patient/Family's Understanding of and Emotional Response to Diagnosis, Current Treatment, and Prognosis: Patient did not seem interested in services provided by CSW but is willing to look into services at [facility].   Emotional Assessment Appearance:  Appears stated age Attitude/Demeanor/Rapport:    Affect (typically observed):  Anxious, Accepting Orientation:  Oriented to Self, Oriented to Place, Oriented to  Time, Oriented to Situation Alcohol / Substance use:  Illicit Drugs, Alcohol Use Psych involvement (Current and /or in the community):  No (Comment)  Discharge Needs  Concerns to be addressed:  Basic Needs Readmission within the last 30 days:  No Current discharge risk:  Substance Abuse Barriers to  Discharge:  Continued Medical Work up   [employer], [place] Work 01/21/2017, 10:56 AM

## 2017-01-21 NOTE — Progress Notes (Signed)
Pharmacy Antibiotic Note  [name] is a 41 y.o. male admitted on 01/21/2017 with sepsis.  Pharmacy has been consulted for vancomycin and Zosyn dosing.  Plan: DW 88kg  Vd 62L kei 0.1hr-1  t1/2 7 hours Vancomycin 1250 mg q 8 hours ordered with stacked dosing. Level before 5th dose. Goal trough 15-20.  Zosyn 3.375 grams q 8 hours ordered.  Height:  (185.4 cm) Weight: 193 lb (87.5 kg) IBW/kg (Calculated) : 79.9  Temp (24hrs), Avg:98.5 F (36.9 C), Min:98.5 F (36.9 C), Max:98.5 F (36.9 C)   Recent Labs Lab 01/21/17 0334  WBC 6.8  CREATININE 0.81  LATICACIDVEN 1.2    Estimated Creatinine Clearance: 137 mL/min (by C-G formula based on SCr of 0.81 mg/dL).    No Known Allergies  Antimicrobials this admission: vancomycin Zosyn 4/18 >>    >>   Dose adjustments this admission:   Microbiology results: 4/19 BCx: pending     4/18 CXR: no active disease Thank you for allowing pharmacy to be a part of this patient's care.  Frankie Scipio S 01/21/2017 4:40 AM

## 2017-01-21 NOTE — ED Provider Notes (Signed)
[facility] Emergency Department Provider Note   First MD Initiated Contact with Patient 01/21/17 [phone]     (approximate)  I have reviewed the triage vital signs and the nursing notes.   HISTORY  Chief Complaint Cellulitis   HPI [name] is a 41 y.o. male bolus of chronic medical conditions including substance abuse presents emergency Department with diffuse right arm pain and swelling times one day. The patient states that he was recently discharged from [facility] secondary to left arm cellulitis. Patient denies any IV drug use does however admit to snorting heroin today as well as Adderall. Patient denies any fever or febrile presentation temperature 98.5.   Past Medical History:  Diagnosis Date  . Depression   . Sinus congestion     Patient Active Problem List   Diagnosis Date Noted  . Tobacco use disorder 10/16/2016  . Bipolar 2 disorder (HCC) 10/15/2016  . Cannabis use disorder, moderate, dependence (HCC) 10/15/2016  . Suicidal ideation 10/15/2016    Past Surgical History:  Procedure Laterality Date  . ANKLE SURGERY    . HERNIA REPAIR    . HERNIA REPAIR    . TENDON REPAIR      Prior to Admission medications   Medication Sig Start Date End Date Taking? Authorizing Provider  fluvoxaMINE (LUVOX) 100 MG tablet Take 2 tablets (200 mg total) by mouth at bedtime. Patient not taking: Reported on 01/21/2017 10/19/16   Shari Prows, MD  lithium carbonate (LITHOBID) 300 MG CR tablet Take 1 tablet (300 mg total) by mouth every 12 (twelve) hours. Patient not taking: Reported on 01/21/2017 10/19/16   Shari Prows, MD  QUEtiapine (SEROQUEL) 400 MG tablet Take 1 tablet (400 mg total) by mouth at bedtime. Patient not taking: Reported on 01/21/2017 10/19/16   Shari Prows, MD    Allergies No known drug allergies  Family history Noncontributory Social History Social History  Substance Use Topics  . Smoking status:  Current Every Day Smoker    Packs/day: 1.00    Types: Cigarettes  . Smokeless tobacco: Never Used  . Alcohol use 7.2 oz/week    12 Cans of beer per week     Comment: reports weekend use (fri/sat night)    Review of Systems Constitutional: No fever/chills Eyes: No visual changes. ENT: No sore throat. Cardiovascular: Denies chest pain. Respiratory: Denies shortness of breath. Gastrointestinal: No abdominal pain.  No nausea, no vomiting.  No diarrhea.  No constipation. Genitourinary: Negative for dysuria. Musculoskeletal: Negative for back pain. Positive for right arm swelling redness and tenderness. Skin: Negative for rash. Neurological: Negative for headaches, focal weakness or numbness.  10-point ROS otherwise negative.  ____________________________________________   PHYSICAL EXAM:  VITAL SIGNS: ED Triage Vitals  Enc Vitals Group     BP 01/21/17 0320 119/72     Pulse Rate 01/21/17 0320 75     Resp 01/21/17 0320 20     Temp 01/21/17 0320 98.5 F (36.9 C)     Temp Source 01/21/17 0320 Oral     SpO2 01/21/17 0320 100 %     Weight 01/21/17 0318 193 lb (87.5 kg)     Height 01/21/17 0318  (1.854 m)     Head Circumference --      Peak Flow --      Pain Score --      Pain Loc --      Pain Edu? --      Excl. in GC? --  Constitutional: Alert and oriented. Bizarre affect agitated. Eyes: Conjunctivae are normal. PERRL. EOMI. Head: Atraumatic. Mouth/Throat: Mucous membranes are moist.  Oropharynx non-erythematous. Neck: No stridor.   Cardiovascular: Normal rate, regular rhythm. Good peripheral circulation. Grossly normal heart sounds. Respiratory: Normal respiratory effort.  No retractions. Lungs CTAB. Gastrointestinal: Soft and nontender. No distention.  Musculoskeletal: Right arm swelling extending from fingers to axilla with accompanying erythema Neurologic:  Normal speech and language. No gross focal neurologic deficits are appreciated.  Skin:  Skin is warm,  dry and intact. No rash noted. Psychiatric: As R affect, agitated Speech and behavior are normal.  ____________________________________________   LABS (all labs ordered are listed, but only abnormal results are displayed)  Labs Reviewed  COMPREHENSIVE METABOLIC PANEL - Abnormal; Notable for the following:       Result Value   Sodium 134 (*)    Potassium 3.0 (*)    Chloride 99 (*)    Glucose, Bld 122 (*)    Calcium 8.6 (*)    AST 129 (*)    ALT 164 (*)    All other components within normal limits  CBC WITH DIFFERENTIAL/PLATELET - Abnormal; Notable for the following:    RBC 4.35 (*)    HCT 37.7 (*)    Monocytes Absolute 1.2 (*)    All other components within normal limits  URINE DRUG SCREEN, QUALITATIVE ([facility] ONLY) - Abnormal; Notable for the following:    Amphetamines, Ur Screen POSITIVE (*)    Opiate, Ur Screen POSITIVE (*)    Cannabinoid 50 Ng, Ur [place] POSITIVE (*)    All other components within normal limits  CULTURE, BLOOD (ROUTINE X 2)  CULTURE, BLOOD (ROUTINE X 2)  LACTIC ACID, PLASMA  LACTIC ACID, PLASMA   ____________________________________________  EKG  ED ECG REPORT I, Evant N BROWN, the attending physician, personally viewed and interpreted this ECG.   Date: 01/21/2017  EKG Time: 3:52 AM  Rate: 80  Rhythm: Normal Sinus rhythm  Axis: Normal  Intervals:Normal  ST&T Change: None     Procedures   ____________________________________________   INITIAL IMPRESSION / ASSESSMENT AND PLAN / ED COURSE  Pertinent labs & imaging results that were available during my care of the patient were reviewed by me and considered in my medical decision making (see chart for details).  41 year old male presents to emergency department with diffuse right arm swelling and erythema consistent with cellulitis. Patient given IV vancomycin and Zosyn given diffuse area of erythema. Patient discussed with Dr. Roe Coombs for possible admission for further management.       ____________________________________________  FINAL CLINICAL IMPRESSION(S) / ED DIAGNOSES  Final diagnoses:  Right arm cellulitis     MEDICATIONS GIVEN DURING THIS VISIT:  Medications  vancomycin (VANCOCIN) 1,250 mg in sodium chloride 0.9 % 250 mL IVPB (not administered)  piperacillin-tazobactam (ZOSYN) IVPB 3.375 g (not administered)  piperacillin-tazobactam (ZOSYN) IVPB 3.375 g (0 g Intravenous Stopped 01/21/17 0503)  vancomycin (VANCOCIN) IVPB 1000 mg/200 mL premix (0 mg Intravenous Stopped 01/21/17 0503)     NEW OUTPATIENT MEDICATIONS STARTED DURING THIS VISIT:  New Prescriptions   No medications on file    Modified Medications   No medications on file    Discontinued Medications   No medications on file     Note:  This document was prepared using Dragon voice recognition software and may include unintentional dictation errors.    Darci Current, MD 01/21/17 [phone]

## 2017-01-21 NOTE — Progress Notes (Signed)
[employer] - [place] at [facility]   PATIENT NAME: [name]    MR#:  [id number]  DATE OF BIRTH:  Oct 22, 1975  SUBJECTIVE:   Patient here due to right upper extremity cellulitis. History of IV drug abuse. Redness in the right upper extremity has significantly improved. No fever.  REVIEW OF SYSTEMS:    Review of Systems  Constitutional: Negative for chills and fever.  HENT: Negative for congestion and tinnitus.   Eyes: Negative for blurred vision and double vision.  Respiratory: Negative for cough, shortness of breath and wheezing.   Cardiovascular: Negative for chest pain, orthopnea and PND.  Gastrointestinal: Negative for abdominal pain, diarrhea, nausea and vomiting.  Genitourinary: Negative for dysuria and hematuria.  Neurological: Negative for dizziness, sensory change and focal weakness.  All other systems reviewed and are negative.   Nutrition: Regular Tolerating Diet: Yes Tolerating PT: Ambulatory     DRUG ALLERGIES:  No Known Allergies  VITALS:  Blood pressure (!) 100/50, pulse 63, temperature 97.6 F (36.4 C), temperature source Oral, resp. rate 13, height 6' (1.829 m), weight 87.4 kg (192 lb 11.2 oz), SpO2 97 %.  PHYSICAL EXAMINATION:   Physical Exam  GENERAL:  41 y.o.-year-old patient lying in the bed anxious, jittery but in NAD.  EYES: Pupils equal, round, reactive to light and accommodation. No scleral icterus. Extraocular muscles intact.  HEENT: Head atraumatic, normocephalic. Oropharynx and nasopharynx clear.  NECK:  Supple, no jugular venous distention. No thyroid enlargement, no tenderness.  LUNGS: Normal breath sounds bilaterally, no wheezing, rales, rhonchi. No use of accessory muscles of respiration.  CARDIOVASCULAR: S1, S2 normal. No murmurs, rubs, or gallops.  ABDOMEN: Soft, nontender, nondistended. Bowel sounds present. No organomegaly or mass.  EXTREMITIES: No cyanosis, clubbing or edema b/l.    NEUROLOGIC: Cranial nerves II  through XII are intact. No focal Motor or sensory deficits b/l.   PSYCHIATRIC: The patient is alert and oriented x 3.  SKIN: No obvious rash, lesion, or ulcer. Multiple Tattoos over body.    LABORATORY PANEL:   CBC  Recent Labs Lab 01/21/17 0334  WBC 6.8  HGB 13.2  HCT 37.7*  PLT 201   ------------------------------------------------------------------------------------------------------------------  Chemistries   Recent Labs Lab 01/21/17 0334  NA 134*  K 3.0*  CL 99*  CO2 27  GLUCOSE 122*  BUN 15  CREATININE 0.81  CALCIUM 8.6*  AST 129*  ALT 164*  ALKPHOS 60  BILITOT 0.7   ------------------------------------------------------------------------------------------------------------------  Cardiac Enzymes No results for input(s): TROPONINI in the last 168 hours. ------------------------------------------------------------------------------------------------------------------  RADIOLOGY:  No results found.   ASSESSMENT AND PLAN:   41 year old male with past medical history of hepatitis C, IV drug abuse, [place], who presents to the hospital due to cellulitis of the right upper extremity also left index finger.  1. Cellulitis-cause of patient's redness swelling and pain. Patient denies any current IV drug abuse. -Clinically afebrile and hemodynamically stable with a normal white cell count. Patient given 1 dose of IV vancomycin ER and has improved. -Continue Keflex, and if continues to improve likely discharge tomorrow.  2. History of IV drug abuse-patient says she's been clean for the past 6 months. -No new track marks noted.  Follow for symptoms of Heroin Withdrawal.   3. Hx of Depression - admitted to behavioral medicine in Jan'18.  - pt. Refuses to take his psych meds as they don't make him feel good.   4. Abnormal LFT's - due to hx of Hep C and will monitor.  5. Hypokalemia - will place on Oral Potassium supplements and monitor.    All the records  are reviewed and case discussed with Care Management/Social Worker. Management plans discussed with the patient, family and they are in agreement.  CODE STATUS: Full code  DVT Prophylaxis: Lovenox  TOTAL TIME TAKING CARE OF THIS PATIENT: 30 minutes.   POSSIBLE D/C IN 1-2 DAYS, DEPENDING ON CLINICAL CONDITION.   Houston Siren M.D on 01/21/2017 at 3:41 PM  Between 7am to 6pm - Pager - [phone]  After 6pm go to [email or web address] - Scientist, research (life sciences) [facility]  Office  [phone]  CC: Primary care physician; No PCP Per Patient

## 2017-01-21 NOTE — Progress Notes (Signed)
Patient was admitted to room 137 from ED. IVs in left arm x2. Patient is A&O x4, but very jittery and jerky with his movements. See Toxics Screen. Some redness and swelling to left hand and right arm. Bed alarm on for safety. IV fluids started. POC reviewed. Tele applied.

## 2017-01-21 NOTE — ED Notes (Signed)
Patient admits to doing some adderall, snorting heroin and a little suboxone earlier today. Patient very restless and states, "it's nervous energy."

## 2017-01-21 NOTE — ED Triage Notes (Addendum)
Patient ambulatory to triage with steady gait; pt reports redness and swelling to right arm since yesterday; tx recently at [facility] x 3 days for left hand cellultis; pt denies any IV drug use; pt very restless with jerky erratic movements of extremities, very talkative

## 2017-01-22 LAB — BASIC METABOLIC PANEL
Anion gap: 6 (ref 5–15)
BUN: 6 mg/dL (ref 6–20)
CALCIUM: 8.3 mg/dL — AB (ref 8.9–10.3)
CHLORIDE: 106 mmol/L (ref 101–111)
CO2: 27 mmol/L (ref 22–32)
Creatinine, Ser: 0.49 mg/dL — ABNORMAL LOW (ref 0.61–1.24)
GFR calc Af Amer: >60 mL/min (ref >60)
GFR calc non Af Amer: >60 mL/min (ref >60)
Glucose, Bld: 88 mg/dL (ref 65–99)
Potassium: 4 mmol/L (ref 3.5–5.1)
SODIUM: 139 mmol/L (ref 135–145)

## 2017-01-22 LAB — HIV ANTIBODY (ROUTINE TESTING W REFLEX): HIV Screen 4th Generation wRfx: NONREACTIVE

## 2017-01-22 LAB — MAGNESIUM: MAGNESIUM: 1.8 mg/dL (ref 1.7–2.4)

## 2017-01-22 MED ORDER — CEPHALEXIN 500 MG PO CAPS: 500.0000 mg | ORAL_CAPSULE | Freq: Four times a day (QID) | ORAL | 0 refills | Status: AC

## 2017-01-22 MED ORDER — CEPHALEXIN 500 MG PO CAPS: 500.0000 mg | ORAL_CAPSULE | Freq: Four times a day (QID) | ORAL | 0 refills | Status: DC

## 2017-01-22 NOTE — Progress Notes (Signed)
Pt refused due oral antibiotic this morning. Pt stated "I will take my pills a little later".

## 2017-01-22 NOTE — Progress Notes (Signed)
Patient is being discharged home with family. Removed 1 IV removed with cath intact from LW. IV in LAC was removed by patient. Reviewed meds and script, along with last dose given. Allowed time for questions.

## 2017-01-22 NOTE — Discharge Summary (Signed)
[employer] - [place] at [facility]   PATIENT NAME: [name]    MR#:  [id number]  DATE OF BIRTH:  [birth year]  DATE OF ADMISSION:  01/21/2017 ADMITTING PHYSICIAN: Arnaldo Natal, MD  DATE OF DISCHARGE: 01/22/2017 12:33 PM  PRIMARY CARE PHYSICIAN: No PCP Per Patient    ADMISSION DIAGNOSIS:  Right arm cellulitis [L03.113]  DISCHARGE DIAGNOSIS:  Active Problems:   Cellulitis   SECONDARY DIAGNOSIS:   Past Medical History:  Diagnosis Date  . Depression   . Hepatitis C   . Sinus congestion     HOSPITAL COURSE:   41 year old male with past medical history of hepatitis C, IV drug abuse, [place], who presents to the hospital due to cellulitis of the right upper extremity also left index finger.  1. Cellulitis-cause of patient's redness swelling and pain. Patient denied any current IV drug abuse. -Patient was admitted to the hospital started on IV vancomycin, shortly thereafter his redness and swelling significantly improved. He was placed on oral Keflex and has improved and is currently afebrile with his cultures being negative and being discharged home on a 10 day course of oral Keflex.  2. History of IV drug abuse-patient says he's been clean for the past 6 months. -No new track marks noted.   3. Hx of Depression - admitted to behavioral medicine in Jan'18.  - pt. Refuses to take his psych meds as they don't make him feel good.   4. Abnormal LFT's - due to hx of Hep C and can be further followed as outpatient.   5. Hypokalemia - improved and resolved w/ supplementation.   DISCHARGE CONDITIONS:   Stable.   CONSULTS OBTAINED:    DRUG ALLERGIES:  No Known Allergies  DISCHARGE MEDICATIONS:   Allergies as of 01/22/2017   No Known Allergies     Medication List    STOP taking these medications   fluvoxaMINE 100 MG tablet Commonly known as:  LUVOX   lithium carbonate 300 MG CR tablet Commonly known as:  LITHOBID   QUEtiapine 400 MG  tablet Commonly known as:  SEROQUEL     TAKE these medications   cephALEXin 500 MG capsule Commonly known as:  KEFLEX Take 1 capsule (500 mg total) by mouth every 6 (six) hours.         DISCHARGE INSTRUCTIONS:   DIET:  Regular diet  DISCHARGE CONDITION:  Stable  ACTIVITY:  Activity as tolerated  OXYGEN:  Home Oxygen: No.   Oxygen Delivery: room air  DISCHARGE LOCATION:  home   If you experience worsening of your admission symptoms, develop shortness of breath, life threatening emergency, suicidal or homicidal thoughts you must seek medical attention immediately by calling 911 or calling your MD immediately  if symptoms less severe.  You Must read complete instructions/literature along with all the possible adverse reactions/side effects for all the Medicines you take and that have been prescribed to you. Take any new Medicines after you have completely understood and accpet all the possible adverse reactions/side effects.   Please note  You were cared for by a hospitalist during your hospital stay. If you have any questions about your discharge medications or the care you received while you were in the hospital after you are discharged, you can call the unit and asked to speak with the hospitalist on call if the hospitalist that took care of you is not available. Once you are discharged, your primary care physician will handle any further medical issues. Please note  that NO REFILLS for any discharge medications will be authorized once you are discharged, as it is imperative that you return to your primary care physician (or establish a relationship with a primary care physician if you do not have one) for your aftercare needs so that they can reassess your need for medications and monitor your lab values.     Today   Redness in right arm much improved.  No fever, BC (-). Overall feels better.   VITAL SIGNS:  Blood pressure 125/70, pulse 70, temperature 98.8 F (37.1  C), temperature source Oral, resp. rate 18, height 6' (1.829 m), weight 87.1 kg (192 lb 1.6 oz), SpO2 98 %.  I/O:   Intake/Output Summary (Last 24 hours) at 01/22/17 1547 Last data filed at 01/22/17 0600  Gross per 24 hour  Intake          2757.08 ml  Output             2150 ml  Net           607.08 ml    PHYSICAL EXAMINATION:   GENERAL:  41 y.o.-year-old patient lying in the bed anxious, jittery but in NAD.  EYES: Pupils equal, round, reactive to light and accommodation. No scleral icterus. Extraocular muscles intact.  HEENT: Head atraumatic, normocephalic. Oropharynx and nasopharynx clear.  NECK:  Supple, no jugular venous distention. No thyroid enlargement, no tenderness.  LUNGS: Normal breath sounds bilaterally, no wheezing, rales, rhonchi. No use of accessory muscles of respiration.  CARDIOVASCULAR: S1, S2 normal. No murmurs, rubs, or gallops.  ABDOMEN: Soft, nontender, nondistended. Bowel sounds present. No organomegaly or mass.  EXTREMITIES: No cyanosis, clubbing or edema b/l.    NEUROLOGIC: Cranial nerves II through XII are intact. No focal Motor or sensory deficits b/l.   PSYCHIATRIC: The patient is alert and oriented x 3.  SKIN: No obvious rash, lesion, or ulcer. Multiple Tattoos over body.   DATA REVIEW:   CBC  Recent Labs Lab 01/21/17 0334  WBC 6.8  HGB 13.2  HCT 37.7*  PLT 201    Chemistries   Recent Labs Lab 01/21/17 0334 01/22/17 0327  NA 134* 139  K 3.0* 4.0  CL 99* 106  CO2 27 27  GLUCOSE 122* 88  BUN 15 6  CREATININE 0.81 0.49*  CALCIUM 8.6* 8.3*  MG  --  1.8  AST 129*  --   ALT 164*  --   ALKPHOS 60  --   BILITOT 0.7  --     Cardiac Enzymes No results for input(s): TROPONINI in the last 168 hours.  Microbiology Results  Results for orders placed or performed during the hospital encounter of 01/21/17  Blood Culture (routine x 2)     Status: None (Preliminary result)   Collection Time: 01/21/17  3:34 AM  Result Value Ref Range  Status   Specimen Description BLOOD L WRIST  Final   Special Requests   Final    BOTTLES DRAWN AEROBIC AND ANAEROBIC Blood Culture adequate volume   Culture NO GROWTH 1 DAY  Final   Report Status PENDING  Incomplete  Blood Culture (routine x 2)     Status: None (Preliminary result)   Collection Time: 01/21/17  3:34 AM  Result Value Ref Range Status   Specimen Description BLOOD L AC  Final   Special Requests   Final    BOTTLES DRAWN AEROBIC AND ANAEROBIC Blood Culture results may not be optimal due to an excessive volume of blood received  in culture bottles   Culture NO GROWTH 1 DAY  Final   Report Status PENDING  Incomplete    RADIOLOGY:  No results found.    Management plans discussed with the patient, family and they are in agreement.  CODE STATUS:  Code Status History    Date Active Date Inactive Code Status Order ID Comments User Context   01/21/2017  8:46 AM 01/22/2017  3:33 PM Full Code [id number]  Arnaldo Natal, MD Inpatient   10/15/2016  5:46 PM 10/19/2016  4:15 PM Full Code [id number]  Audery Amel, MD Inpatient      TOTAL TIME TAKING CARE OF THIS PATIENT: 40 minutes.    Houston Siren M.D on 01/22/2017 at 3:47 PM  Between 7am to 6pm - Pager - [phone]  After 6pm go to [email or web address] - Scientist, research (life sciences) [facility]  Office  [phone]  CC: Primary care physician; No PCP Per Patient

## 2017-01-22 NOTE — Care Management Note (Signed)
Case Management Note  Patient Details  Name: [name] MRN: [id number] Date of Birth: Feb 07, 1976  Subjective/Objective:   [facility].   Explained process to patient. No further needs identified. Case closed.                Action/Plan:   Expected Discharge Date:  01/22/17               Expected Discharge Plan:  Home/Self Care  In-House Referral:     Discharge planning Services  CM Consult, Homebound not met per provider, [facility], Medication Assistance  Post Acute Care Choice:    Choice offered to:     DME Arranged:    DME Agency:     HH Arranged:    HH Agency:     Status of Service:  Completed, signed off  If discussed at [employer], dates discussed:    Additional Comments:  Jolly Mango, RN 01/22/2017, 9:10 AM

## 2017-01-26 LAB — CULTURE, BLOOD (ROUTINE X 2)
CULTURE: NO GROWTH
CULTURE: NO GROWTH
Special Requests: ADEQUATE

## 2017-05-14 ENCOUNTER — Emergency Department
Admission: EM | Admit: 2017-05-14 | Discharge: 2017-05-14 | Disposition: A | Discharge status: Home / Self Care | Payer: Self-pay

## 2017-05-14 ENCOUNTER — Encounter: Payer: Self-pay | Admitting: Emergency Medicine

## 2017-05-14 ENCOUNTER — Emergency Department
Admission: EM | Admit: 2017-05-14 | Discharge: 2017-05-15 | Disposition: A | Discharge status: Other Acute Inpatient Hospital | Payer: Self-pay | Attending: Emergency Medicine | Admitting: Emergency Medicine

## 2017-05-14 DIAGNOSIS — F17210 Nicotine dependence, cigarettes, uncomplicated: Secondary | ICD-10-CM | POA: Insufficient documentation

## 2017-05-14 DIAGNOSIS — B1920 Unspecified viral hepatitis C without hepatic coma: Secondary | ICD-10-CM

## 2017-05-14 DIAGNOSIS — F1910 Other psychoactive substance abuse, uncomplicated: Secondary | ICD-10-CM | POA: Insufficient documentation

## 2017-05-14 DIAGNOSIS — R45851 Suicidal ideations: Secondary | ICD-10-CM | POA: Insufficient documentation

## 2017-05-14 DIAGNOSIS — F3181 Bipolar II disorder: Secondary | ICD-10-CM

## 2017-05-14 LAB — COMPREHENSIVE METABOLIC PANEL
ALT: 131 U/L — ABNORMAL HIGH (ref 17–63)
ANION GAP: 8 (ref 5–15)
AST: 105 U/L — ABNORMAL HIGH (ref 15–41)
Albumin: 3.9 g/dL (ref 3.5–5.0)
Alkaline Phosphatase: 60 U/L (ref 38–126)
BUN: 22 mg/dL — ABNORMAL HIGH (ref 6–20)
CHLORIDE: 103 mmol/L (ref 101–111)
CO2: 25 mmol/L (ref 22–32)
Calcium: 8.9 mg/dL (ref 8.9–10.3)
Creatinine, Ser: 1.09 mg/dL (ref 0.61–1.24)
GFR calc non Af Amer: >60 mL/min (ref >60)
Glucose, Bld: 104 mg/dL — ABNORMAL HIGH (ref 65–99)
Potassium: 3 mmol/L — ABNORMAL LOW (ref 3.5–5.1)
Sodium: 136 mmol/L (ref 135–145)
Total Bilirubin: 0.6 mg/dL (ref 0.3–1.2)
Total Protein: 6.9 g/dL (ref 6.5–8.1)

## 2017-05-14 LAB — URINE DRUG SCREEN, QUALITATIVE (ARMC ONLY)
AMPHETAMINES, UR SCREEN: POSITIVE — AB
BENZODIAZEPINE, UR SCRN: NOT DETECTED
Barbiturates, Ur Screen: NOT DETECTED
Cannabinoid 50 Ng, Ur ~~LOC~~: NOT DETECTED
Cocaine Metabolite,Ur ~~LOC~~: NOT DETECTED
MDMA (Ecstasy)Ur Screen: NOT DETECTED
METHADONE SCREEN, URINE: NOT DETECTED
Opiate, Ur Screen: NOT DETECTED
PHENCYCLIDINE (PCP) UR S: NOT DETECTED
TRICYCLIC, UR SCREEN: NOT DETECTED

## 2017-05-14 LAB — ACETAMINOPHEN LEVEL

## 2017-05-14 LAB — CBC
HCT: 38.6 % — ABNORMAL LOW (ref 40.0–52.0)
Hemoglobin: 13.2 g/dL (ref 13.0–18.0)
MCH: 29.1 pg (ref 26.0–34.0)
MCHC: 34.2 g/dL (ref 32.0–36.0)
MCV: 84.9 fL (ref 80.0–100.0)
PLATELETS: 280 10*3/uL (ref 150–440)
RBC: 4.55 MIL/uL (ref 4.40–5.90)
RDW: 13.3 % (ref 11.5–14.5)
WBC: 11.7 10*3/uL — AB (ref 3.8–10.6)

## 2017-05-14 LAB — SALICYLATE LEVEL: Salicylate Lvl: 13.8 mg/dL (ref 2.8–30.0)

## 2017-05-14 LAB — ETHANOL: Alcohol, Ethyl (B): <5 mg/dL (ref <5)

## 2017-05-14 MED ORDER — QUETIAPINE FUMARATE 200 MG PO TABS
400.0000 mg | ORAL_TABLET | Freq: Every day | ORAL | Status: DC
Start: 2017-05-14 — End: 2017-05-15
  Administered 2017-05-14: 400 mg via ORAL
  Filled 2017-05-14: qty 2

## 2017-05-14 MED ORDER — LITHIUM CARBONATE 300 MG PO CAPS
300.0000 mg | ORAL_CAPSULE | Freq: Two times a day (BID) | ORAL | Status: DC
  Administered 2017-05-14: 300 mg via ORAL
  Filled 2017-05-14: qty 1

## 2017-05-14 MED ORDER — FLUVOXAMINE MALEATE 100 MG PO TABS
100.0000 mg | ORAL_TABLET | Freq: Every day | ORAL | Status: DC
  Administered 2017-05-14: 100 mg via ORAL
  Filled 2017-05-14: qty 1

## 2017-05-14 NOTE — ED Triage Notes (Signed)
Pt to ED stating that he needs to have psych eval. Pt admits that he has been off his medication since he was last here, pt has also been using meth. Pt with pressured speech in triage. Pt appears to be anxious.

## 2017-05-14 NOTE — ED Notes (Signed)
5 silver metal rings placed in specimen cup and labeled with pts label. 3 ear rings, 1 watch, 1 paracord bracelet. Placed in pt belongings bag.

## 2017-05-14 NOTE — ED Notes (Signed)

## 2017-05-14 NOTE — ED Notes (Signed)
Went out into waiting room and called pt, pt in bathroom, went in to check on him and he states he has abd pain and diarrhea and will be out as soon as he can.

## 2017-05-14 NOTE — ED Notes (Signed)
[name] is currently in his room consulting

## 2017-05-14 NOTE — ED Provider Notes (Signed)
[facility] Emergency Department Provider Note  ____________________________________________   First MD Initiated Contact with Patient 05/14/17 1539     (approximate)  I have reviewed the triage vital signs and the nursing notes.   HISTORY  Chief Complaint Psychiatric Evaluation   HPI [name] is a 41 y.o. male with a history of hepatitis C, depression as well as bipolar disorder and polysubstance abuse who is presenting to the emergency department today with suicidal ideation. He says that he has been trying to kill himself by injecting meth as well as cocaine. He admits also to using benzodiazepines, Xanax, by mouth. Says his last use was yesterday. Says he is also sustained scratches throughout his body by running through a bush. Says that his last tetanus shot was within the past year. He denies using alcohol recently. He says that he has also been off his psychiatric medications since his last visit to the emergency department.   Past Medical History:  Diagnosis Date  . Depression   . Hepatitis C   . Sinus congestion     Patient Active Problem List   Diagnosis Date Noted  . Cellulitis 01/21/2017  . Tobacco use disorder 10/16/2016  . Bipolar 2 disorder (HCC) 10/15/2016  . Cannabis use disorder, moderate, dependence (HCC) 10/15/2016  . Suicidal ideation 10/15/2016    Past Surgical History:  Procedure Laterality Date  . ANKLE SURGERY    . HERNIA REPAIR    . HERNIA REPAIR    . TENDON REPAIR      Prior to Admission medications   Not on File    Allergies Patient has no known allergies.  Family History  Problem Relation Age of Onset  . Heart failure Mother     Social History Social History  Substance Use Topics  . Smoking status: Current Every Day Smoker    Packs/day: 1.00    Types: Cigarettes  . Smokeless tobacco: Never Used  . Alcohol use 7.2 oz/week    12 Cans of beer per week     Comment: reports weekend use (fri/sat  night)    Review of Systems  Constitutional: No fever/chills Eyes: No visual changes. ENT: No sore throat. Cardiovascular: Denies chest pain. Respiratory: Denies shortness of breath. Gastrointestinal: No abdominal pain.  No nausea, no vomiting.  No diarrhea.  No constipation. Genitourinary: Negative for dysuria. Musculoskeletal: Negative for back pain. Skin: Negative for rash. Neurological: Negative for headaches, focal weakness or numbness.   ____________________________________________   PHYSICAL EXAM:  VITAL SIGNS: ED Triage Vitals  Enc Vitals Group     BP 05/14/17 1523 132/73     Pulse Rate 05/14/17 1523 84     Resp 05/14/17 1523 16     Temp 05/14/17 1523 98.1 F (36.7 C)     Temp Source 05/14/17 1523 Oral     SpO2 05/14/17 1523 97 %     Weight 05/14/17 1519 180 lb (81.6 kg)     Height 05/14/17 1519 6\' 1"  (1.854 m)     Head Circumference --      Peak Flow --      Pain Score 05/14/17 1518 10     Pain Loc --      Pain Edu? --      Excl. in GC? --     Constitutional: Alert and oriented. Pressured speech. Eyes: Conjunctivae are normal.  Head: Atraumatic. Nose: No congestion/rhinnorhea. Mouth/Throat: Mucous membranes are moist.  Neck: No stridor.   Cardiovascular: Normal rate, regular  rhythm. Grossly normal heart sounds.   Respiratory: Normal respiratory effort.  No retractions. Lungs CTAB. Gastrointestinal: Soft and nontender. No distention. No CVA tenderness. Musculoskeletal: No lower extremity tenderness nor edema.  No joint effusions. Neurologic:  Normal speech and language. No gross focal neurologic deficits are appreciated. Skin:  Diffuse superficial abrasions which the patient says that he sustained by running through a bush. There is no active bleeding, induration or pus. Bilateral track marks to the antecubital fossa without any swelling, edema, tenderness to palpation or pus. Psychiatric: Pressured  speech.  ____________________________________________   LABS (all labs ordered are listed, but only abnormal results are displayed)  Labs Reviewed  COMPREHENSIVE METABOLIC PANEL - Abnormal; Notable for the following:       Result Value   Potassium 3.0 (*)    Glucose, Bld 104 (*)    BUN 22 (*)    AST 105 (*)    ALT 131 (*)    All other components within normal limits  ACETAMINOPHEN LEVEL - Abnormal; Notable for the following:    Acetaminophen (Tylenol), Serum <10 (*)    All other components within normal limits  CBC - Abnormal; Notable for the following:    WBC 11.7 (*)    HCT 38.6 (*)    All other components within normal limits  URINE DRUG SCREEN, QUALITATIVE ([facility] ONLY) - Abnormal; Notable for the following:    Amphetamines, Ur Screen POSITIVE (*)    All other components within normal limits  ETHANOL  SALICYLATE LEVEL   ____________________________________________  EKG   ____________________________________________  RADIOLOGY   ____________________________________________   PROCEDURES  Procedure(s) performed:   Procedures  Critical Care performed:   ____________________________________________   INITIAL IMPRESSION / ASSESSMENT AND PLAN / ED COURSE  Pertinent labs & imaging results that were available during my care of the patient were reviewed by me and considered in my medical decision making (see chart for details).  Patient to be placed under involuntary commitment. Patient is aware of this plan to be placed under commitment as well as to see psychiatry.      ____________________________________________   FINAL CLINICAL IMPRESSION(S) / ED DIAGNOSES  Polysubstance abuse. Suicidal ideation.    NEW MEDICATIONS STARTED DURING THIS VISIT:  New Prescriptions   No medications on file     Note:  This document was prepared using Dragon voice recognition software and may include unintentional dictation errors.     Myrna BlazerSchaevitz, David Matthew,  MD 05/14/17 [phone]

## 2017-05-14 NOTE — ED Notes (Addendum)
Nurse talked with Patient and He states " I have been doing a drugs along time on and off, I know I can quit, drugs are not my problem it is my mental illness, my dad has mental illness also, and I'm so sick of this life I hope I die, I just want it to end, I'm tired of it all" Patient does not have a plan to for Suicide here, but states if He leaves He would probably take a overdose or mix drugs to kill himself, He has pressured speech and can't be still, states that He has not slept for 10 days because He has been on Meth. Nurse will monitor and camera surveillance in process for safety. Nurse did talk to him about hopefully finding a medication that would help him mentally, antidepressant, and He states " that stuff does not help me" Patient does have neck and back Pain that is chronic and only will take motrin if needed.

## 2017-05-14 NOTE — ED Notes (Signed)
Pt came to the front desk when he was finished in the restroom and states he changed his mind and left.

## 2017-05-14 NOTE — ED Notes (Signed)
Report was received from Vincent JewWendy L., RN; Pt. Verbalizes  complaints of, "tired of living"; and distress poly substance abuse; verbalizes having S.I.; with a plan to O.D.; denies having Hi. Continue to monitor with 15 min. Monitoring.

## 2017-05-14 NOTE — ED Notes (Signed)
Pt has been called for triage multiple times, pt noted to be in the BR and states he is using the restroom.. Police officer sent to the restroom

## 2017-05-14 NOTE — ED Notes (Signed)
Patient received PM snack. 

## 2017-05-14 NOTE — ED Notes (Signed)
Patient with scratches all over him and He states it is from running thur some steel wire, He states that he thought the police were going to put him in prison, but that He was hallucinating, patient states I know Im not ever doing meth again. Patient is safe and will be monitored.

## 2017-05-14 NOTE — BH Assessment (Signed)
Referral information for Psychiatric Hospitalization faxed to;     [facility] [phone] or [phone]   [facility] [phone]),    [facility] [phone]),    [facility] [phone]),     [facility] [phone]).   [place] [phone]

## 2017-05-14 NOTE — BH Assessment (Signed)
Assessment Note  [name] is an 41 y.o. male who presents to the ER due to voicing SI. Patient states he attempted to end his life by overdosing on drugs. He states, "I took enough (illicit) drugs to kill myself but I didn't work." He further reports, he has "binged on Crystal Meth (Methamphetamine), Cocaine, Cannabis & Pills (Benzo's) for ten days and ain't had no sleep."   Stressors he has identified includes; risk of homelessness, family problems and untreated mental illness. He states, "drugs aint' my number one problem but for my Bipolar is the issue. I use drugs to help me with it. Cause if I wanted to stop, I'll just stop."  During the interview, the patient was able to answer the questions. At times, he would be tangential but was able to be redirected an answer questions.  Patient denies current involvement with the legal system. He also denies having a history violence and aggression. He denies HI and AV/H.  Diagnosis: Bipolar  Past Medical History:  Past Medical History:  Diagnosis Date  . Depression   . Hepatitis C   . Sinus congestion     Past Surgical History:  Procedure Laterality Date  . ANKLE SURGERY    . HERNIA REPAIR    . HERNIA REPAIR    . TENDON REPAIR      Family History:  Family History  Problem Relation Age of Onset  . Heart failure Mother     Social History:  reports that he has been smoking Cigarettes.  He has been smoking about 1.00 pack per day. He has never used smokeless tobacco. He reports that he drinks about 7.2 oz of alcohol per week . He reports that he uses drugs, including Marijuana and Methamphetamines, about 3 times per week.  Additional Social History:  Alcohol / Drug Use Pain Medications: See PTA Prescriptions: See PTA Over the Counter: See PTA History of alcohol / drug use?: Yes Longest period of sobriety (when/how long): "A year and a half..." Negative Consequences of Use: Personal relationships, Surveyor, quantity, Work / Programmer, multimedia,  Armed forces operational officer Withdrawal Symptoms:  (Reports of none) Substance #1 Name of Substance 1: "Crystal Meth (Methamphines)" Substance #2 Name of Substance 2: Cocaine Substance #3 Name of Substance 3: Cannabis Substance #4 Name of Substance 4: "Pills (Benzo's)"  CIWA: CIWA-Ar BP: 132/73 Pulse Rate: 84 COWS:    Allergies: No Known Allergies  Home Medications:  (Not in a hospital admission)  OB/GYN Status:  No LMP for male patient.  General Assessment Data Assessment unable to be completed: Yes Location of Assessment: [facility] ED TTS Assessment: In system Is this a Tele or Face-to-Face Assessment?: Face-to-Face Is this an Initial Assessment or a Re-assessment for this encounter?: Initial Assessment Marital status: Single Maiden name: n/a Is patient pregnant?: No Pregnancy Status: No Living Arrangements: Parent (Stepfather) Can pt return to current living arrangement?: Yes Admission Status: Involuntary Is patient capable of signing voluntary admission?: No (Under IVC) Referral Source: Self/Family/Friend Insurance type: None  Medical Screening Exam [facility] Walk-in ONLY) Medical Exam completed: Yes  Crisis Care Plan Living Arrangements: Parent (Stepfather) Legal Guardian: Other: (Self) Name of Psychiatrist: Reports of none Name of Therapist: Reports of none  Education Status Is patient currently in school?: No Current Grade: n/a Highest grade of school patient has completed: n/a Name of school: n/a Contact person: n/a  Risk to self with the past 6 months Suicidal Ideation: Yes-Currently Present Has patient been a risk to self within the past 6 months prior  to admission? : Yes Suicidal Intent: Yes-Currently Present Has patient had any suicidal intent within the past 6 months prior to admission? : Yes Is patient at risk for suicide?: Yes Suicidal Plan?: Yes-Currently Present Has patient had any suicidal plan within the past 6 months prior to admission? : Yes Specify Current  Suicidal Plan: Overdose on "drugs" Access to Means: No What has been your use of drugs/alcohol within the last 12 months?: "Crystal Meth" Methamphine, Cocaine, Cannabis & Pills (Benzo's)" Previous Attempts/Gestures: Yes How many times?: 7 Other Self Harm Risks: Active Addiction Triggers for Past Attempts: Family contact, None known, Unknown, Other (Comment) Intentional Self Injurious Behavior: None Family Suicide History: Unknown Recent stressful life event(s): Other (Comment), Financial Problems, Conflict (Comment), Divorce, Loss (Comment) Persecutory voices/beliefs?: No Depression: Yes Depression Symptoms: Loss of interest in usual pleasures, Feeling worthless/self pity, Feeling angry/irritable, Isolating, Fatigue, Tearfulness Substance abuse history and/or treatment for substance abuse?: Yes Suicide prevention information given to non-admitted patients: Not applicable  Risk to Others within the past 6 months Homicidal Ideation: No Does patient have any lifetime risk of violence toward others beyond the six months prior to admission? : No Thoughts of Harm to Others: No Current Homicidal Intent: No Current Homicidal Plan: No Access to Homicidal Means: No Identified Victim: Reports of none History of harm to others?: No Assessment of Violence: None Noted Violent Behavior Description: Reports of none Does patient have access to weapons?: No Criminal Charges Pending?: No Does patient have a court date: No Is patient on probation?: No  Psychosis Hallucinations: None noted Delusions: None noted  Mental Status Report Appearance/Hygiene: Unremarkable, In scrubs Eye Contact: Fair Motor Activity: Freedom of movement, Unremarkable Speech: Logical/coherent, Unremarkable Level of Consciousness: Alert Mood: Depressed, Anxious, Sad, Irritable, Pleasant Affect: Anxious, Appropriate to circumstance, Depressed, Sad Anxiety Level: Minimal Thought Processes: Coherent, Relevant Judgement:  Unimpaired Orientation: Person, Place, Time, Situation, Appropriate for developmental age Obsessive Compulsive Thoughts/Behaviors: Minimal  Cognitive Functioning Concentration: Normal Memory: Recent Intact, Remote Intact IQ: Average Insight: Fair Impulse Control: Fair Appetite: Good Weight Loss: 0 Weight Gain: 0 Sleep: Decreased Total Hours of Sleep: 0 ("I was up for 10 hours") Vegetative Symptoms: None  ADLScreening [facility] Assessment Services) Patient's cognitive ability adequate to safely complete daily activities?: Yes Patient able to express need for assistance with ADLs?: Yes Independently performs ADLs?: Yes (appropriate for developmental age)  Prior Inpatient Therapy Prior Inpatient Therapy: Yes Prior Therapy Dates: 10/2016 Prior Therapy Facilty/Provider(s): [facility] Reason for Treatment: 10/2016  Prior Outpatient Therapy Prior Outpatient Therapy: Yes Prior Therapy Dates: "It was a long time ago" Prior Therapy Facilty/Provider(s): "I can't remember but it was right down the street." Reason for Treatment: Bipolar Does patient have an ACCT team?: No Does patient have Intensive In-House Services?  : No Does patient have [facility] services? : No Does patient have P4CC services?: No  ADL Screening (condition at time of admission) Patient's cognitive ability adequate to safely complete daily activities?: Yes Is the patient deaf or have difficulty hearing?: No Does the patient have difficulty seeing, even when wearing glasses/contacts?: No Does the patient have difficulty concentrating, remembering, or making decisions?: No Patient able to express need for assistance with ADLs?: Yes Does the patient have difficulty dressing or bathing?: No Independently performs ADLs?: Yes (appropriate for developmental age) Does the patient have difficulty walking or climbing stairs?: No Weakness of Legs: None Weakness of Arms/Hands: None  Home Assistive Devices/Equipment Home  Assistive Devices/Equipment: None  Therapy Consults (therapy consults require a physician  order) PT Evaluation Needed: No OT Evalulation Needed: No SLP Evaluation Needed: No Abuse/Neglect Assessment (Assessment to be complete while patient is alone) Physical Abuse: Denies Verbal Abuse: Denies Sexual Abuse: Denies Exploitation of patient/patient's resources: Denies Self-Neglect: Denies Values / Beliefs Cultural Requests During Hospitalization: None Spiritual Requests During Hospitalization: None Consults Spiritual Care Consult Needed: No Social Work Consult Needed: No Merchant navy officerAdvance Directives (For Healthcare) Does Patient Have a Medical Advance Directive?: No Would patient like information on creating a medical advance directive?: No - Patient declined    Additional Information 1:1 In Past 12 Months?: No CIRT Risk: No Elopement Risk: No Does patient have medical clearance?: Yes  Child/Adolescent Assessment Running Away Risk: Denies (Patient is an adult)  Disposition:  Disposition Initial Assessment Completed for this Encounter: Yes Disposition of Patient: Other dispositions (ER MD Ordered Psych Consult)  On Site Evaluation by:   Reviewed with Physician:    Lilyan Gilfordalvin J. Dia Jefferys MS, LCAS, LPC, NCC, CCSI Therapeutic Triage Specialist 05/14/2017 7:43 PM

## 2017-05-14 NOTE — Consult Note (Signed)
[facility]iat[facility] Consult   Reason for Consult:  Consult for 41 year old man with history of bipolar disorder and substance abuse who comes to the emergency room reporting suicidal thoughts Referring Physician:  [place] Patient Identification: [name] MRN:  [id number] Principal Diagnosis: Bipolar 2 disorder [facility]) Diagnosis:   Patient Active Problem List   Diagnosis Date Noted  . Amphetamine abuse [F15.10] 05/14/2017  . Hepatitis C [B19.20] 05/14/2017  . Cellulitis [L03.90] 01/21/2017  . Tobacco use disorder [F17.200] 10/16/2016  . Bipolar 2 disorder ([place]) [F31.81] 10/15/2016  . Cannabis use disorder, moderate, dependence ([place]) [F12.20] 10/15/2016  . Suicidal ideation [R45.851] 10/15/2016    Total Time spent with patient: 1 hour  Subjective:   [name] is a 41 y.o. male patient admitted with "I'm suicidal".  HPI:  Patient with a history of bipolar disorder comes to the emergency room requesting admission. Patient says that he has been awake for 10 days smoking methamphetamine. Not eating well. Mood getting worse and worse. Having active thoughts about wanting to die and wanting to kill himself by overdosing on drugs. Having some hallucinations. He hasn't been on psychiatric medicine or going for any follow-up in months.  Medical history: Hepatitis C positive  Social history: Lives with his mother. Not currently working. Doesn't have a lot of support.  Substance abuse history: Established history of abuse of multiple drugs but the methamphetamine seems to have gotten much worse recently  Past Psychiatric History: Patient has had admissions in the past for suicidal ideation and self injury with a diagnosis of bipolar disorder. Was last discharged on a combination of Seroquel and lithium and Luvox. Has not been following up at all  Risk to Self: Is patient at risk for suicide?: Yes Risk to Others:   Prior Inpatient Therapy:   Prior Outpatient Therapy:     Past Medical History:  Past Medical History:  Diagnosis Date  . Depression   . Hepatitis C   . Sinus congestion     Past Surgical History:  Procedure Laterality Date  . ANKLE SURGERY    . HERNIA REPAIR    . HERNIA REPAIR    . TENDON REPAIR     Family History:  Family History  Problem Relation Age of Onset  . Heart failure Mother    Family Psychiatric  History: Positive for anxiety Social History:  History  Alcohol Use  . 7.2 oz/week  . 12 Cans of beer per week    Comment: reports weekend use (fri/sat night)     History  Drug Use  . Frequency: 3.0 times per week  . Types: Marijuana, Methamphetamines    Comment: Pt states that he last used meth yesterday morning     Social History   Social History  . Marital status: Single    Spouse name: N/A  . Number of children: N/A  . Years of education: N/A   Social History Main Topics  . Smoking status: Current Every Day Smoker    Packs/day: 1.00    Types: Cigarettes  . Smokeless tobacco: Never Used  . Alcohol use 7.2 oz/week    12 Cans of beer per week     Comment: reports weekend use (fri/sat night)  . Drug use: Yes    Frequency: 3.0 times per week    Types: Marijuana, Methamphetamines     Comment: Pt states that he last used meth yesterday morning   . Sexual activity: Not Asked   Other Topics Concern  .  None   Social History Narrative  . None   Additional Social History:    Allergies:  No Known Allergies  Labs:  Results for orders placed or performed during the hospital encounter of 05/14/17 (from the past 48 hour(s))  Comprehensive metabolic panel     Status: Abnormal   Collection Time: 05/14/17  3:21 PM  Result Value Ref Range   Sodium 136 135 - 145 mmol/L   Potassium 3.0 (L) 3.5 - 5.1 mmol/L   Chloride 103 101 - 111 mmol/L   CO2 25 22 - 32 mmol/L   Glucose, Bld 104 (H) 65 - 99 mg/dL   BUN 22 (H) 6 - 20 mg/dL   Creatinine, Ser 1.09 0.61 - 1.24 mg/dL   Calcium 8.9 8.9 - 10.3 mg/dL   Total  Protein 6.9 6.5 - 8.1 g/dL   Albumin 3.9 3.5 - 5.0 g/dL   AST 105 (H) 15 - 41 U/L   ALT 131 (H) 17 - 63 U/L   Alkaline Phosphatase 60 38 - 126 U/L   Total Bilirubin 0.6 0.3 - 1.2 mg/dL   GFR calc non Af Amer >60 >60 mL/min   GFR calc Af Amer >60 >60 mL/min    Comment: (NOTE) The eGFR has been calculated using the CKD EPI equation. This calculation has not been validated in all clinical situations. eGFR's persistently <60 mL/min signify possible Chronic Kidney Disease.    Anion gap 8 5 - 15  Ethanol     Status: None   Collection Time: 05/14/17  3:21 PM  Result Value Ref Range   Alcohol, Ethyl (B) <5 <5 mg/dL    Comment:        LOWEST DETECTABLE LIMIT FOR SERUM ALCOHOL IS 5 mg/dL FOR MEDICAL PURPOSES ONLY   Salicylate level     Status: None   Collection Time: 05/14/17  3:21 PM  Result Value Ref Range   Salicylate Lvl 16.6 2.8 - 30.0 mg/dL  Acetaminophen level     Status: Abnormal   Collection Time: 05/14/17  3:21 PM  Result Value Ref Range   Acetaminophen (Tylenol), Serum <10 (L) 10 - 30 ug/mL    Comment:        THERAPEUTIC CONCENTRATIONS VARY SIGNIFICANTLY. A RANGE OF 10-30 ug/mL MAY BE AN EFFECTIVE CONCENTRATION FOR MANY PATIENTS. HOWEVER, SOME ARE BEST TREATED AT CONCENTRATIONS OUTSIDE THIS RANGE. ACETAMINOPHEN CONCENTRATIONS >150 ug/mL AT 4 HOURS AFTER INGESTION AND >50 ug/mL AT 12 HOURS AFTER INGESTION ARE OFTEN ASSOCIATED WITH TOXIC REACTIONS.   cbc     Status: Abnormal   Collection Time: 05/14/17  3:21 PM  Result Value Ref Range   WBC 11.7 (H) 3.8 - 10.6 K/uL   RBC 4.55 4.40 - 5.90 MIL/uL   Hemoglobin 13.2 13.0 - 18.0 g/dL   HCT 38.6 (L) 40.0 - 52.0 %   MCV 84.9 80.0 - 100.0 fL   MCH 29.1 26.0 - 34.0 pg   MCHC 34.2 32.0 - 36.0 g/dL   RDW 13.3 11.5 - 14.5 %   Platelets 280 150 - 440 K/uL  Urine Drug Screen, Qualitative     Status: Abnormal   Collection Time: 05/14/17  3:21 PM  Result Value Ref Range   Tricyclic, Ur Screen NONE DETECTED NONE DETECTED    Amphetamines, Ur Screen POSITIVE (A) NONE DETECTED   MDMA (Ecstasy)Ur Screen NONE DETECTED NONE DETECTED   Cocaine Metabolite,Ur [place] NONE DETECTED NONE DETECTED   Opiate, Ur Screen NONE DETECTED NONE DETECTED   Phencyclidine (PCP) Ur  S NONE DETECTED NONE DETECTED   Cannabinoid 50 Ng, Ur [place] NONE DETECTED NONE DETECTED   Barbiturates, Ur Screen NONE DETECTED NONE DETECTED   Benzodiazepine, Ur Scrn NONE DETECTED NONE DETECTED   Methadone Scn, Ur NONE DETECTED NONE DETECTED    Comment: (NOTE) 124  Tricyclics, urine               Cutoff 1000 ng/mL 200  Amphetamines, urine             Cutoff 1000 ng/mL 300  MDMA (Ecstasy), urine           Cutoff 500 ng/mL 400  Cocaine Metabolite, urine       Cutoff 300 ng/mL 500  Opiate, urine                   Cutoff 300 ng/mL 600  Phencyclidine (PCP), urine      Cutoff 25 ng/mL 700  Cannabinoid, urine              Cutoff 50 ng/mL 800  Barbiturates, urine             Cutoff 200 ng/mL 900  Benzodiazepine, urine           Cutoff 200 ng/mL 1000 Methadone, urine                Cutoff 300 ng/mL 1100 1200 The urine drug screen provides only a preliminary, unconfirmed 1300 analytical test result and should not be used for non-medical 1400 purposes. Clinical consideration and professional judgment should 1500 be applied to any positive drug screen result due to possible 1600 interfering substances. A more specific alternate chemical method 1700 must be used in order to obtain a confirmed analytical result.  1800 Gas chromato graphy / mass spectrometry (GC/MS) is the preferred 1900 confirmatory method.     Current Facility-Administered Medications  Medication Dose Route Frequency Provider Last Rate Last Dose  . fluvoxaMINE (LUVOX) tablet 100 mg  100 mg Oral QHS Tell Rozelle T, MD      . lithium carbonate capsule 300 mg  300 mg Oral BID WC Johonna Binette T, MD      . QUEtiapine (SEROQUEL) tablet 400 mg  400 mg Oral QHS Zyia Kaneko, Madie Reno, MD       No current  outpatient prescriptions on file.    Musculoskeletal: Strength & Muscle Tone: within normal limits Gait & Station: normal Patient leans: N/A  Psychiatric Specialty Exam: Physical Exam  Nursing note and vitals reviewed. Constitutional: He appears well-developed and well-nourished.  HENT:  Head: Normocephalic and atraumatic.  Eyes: Pupils are equal, round, and reactive to light. Conjunctivae are normal.  Neck: Normal range of motion.  Cardiovascular: Regular rhythm and normal heart sounds.   Respiratory: Effort normal. No respiratory distress.  GI: Soft.  Musculoskeletal: Normal range of motion.  Neurological: He is alert.  Skin: Skin is warm and dry.     Psychiatric: His affect is angry and blunt. His speech is delayed. He is slowed and withdrawn. Cognition and memory are impaired. He expresses impulsivity and inappropriate judgment. He exhibits a depressed mood. He expresses suicidal ideation. He exhibits abnormal recent memory and abnormal remote memory.    Review of Systems  Constitutional: Negative.   HENT: Negative.   Eyes: Negative.   Respiratory: Negative.   Cardiovascular: Negative.   Gastrointestinal: Negative.   Musculoskeletal: Negative.   Skin: Negative.   Neurological: Negative.   Psychiatric/Behavioral: Positive for depression, substance abuse and suicidal ideas.  Negative for hallucinations and memory loss. The patient is nervous/anxious and has insomnia.     Blood pressure 132/73, pulse 84, temperature 98.1 F (36.7 C), temperature source Oral, resp. rate 16, height 6' 1"  (1.854 m), weight 81.6 kg (180 lb), SpO2 97 %.Body mass index is 23.75 kg/m.  General Appearance: Disheveled  Eye Contact:  None  Speech:  Garbled  Volume:  Decreased  Mood:  Angry, Depressed and Dysphoric  Affect:  Congruent  Thought Process:  Goal Directed  Orientation:  Full (Time, Place, and Person)  Thought Content:  Rumination and Tangential  Suicidal Thoughts:  Yes.  with  intent/plan  Homicidal Thoughts:  No  Memory:  Immediate;   Fair Recent;   Poor Remote;   Poor  Judgement:  Impaired  Insight:  Lacking  Psychomotor Activity:  Decreased  Concentration:  Concentration: Poor  Recall:  [place] of Knowledge:  Fair  Language:  Fair  Akathisia:  No  Handed:  Right  AIMS (if indicated):     Assets:  Desire for Improvement Housing  ADL's:  Impaired  Cognition:  Impaired,  Moderate  Sleep:        Treatment Plan Summary: Daily contact with patient to assess and evaluate symptoms and progress in treatment, Medication management and Plan 41 year old man with a history of bipolar disorder and substance abuse. Drug screen positive for amphetamines. Patient is withdrawn and depressed suicidal. Request inpatient treatment. Case reviewed with TTS and emergency room physician. Orders restarted for his previously prescribed medicine. Plan to get full set of labs and admit when possible to the inpatient psychiatric ward.  Disposition: Recommend psychiatric Inpatient admission when medically cleared. Supportive therapy provided about ongoing stressors.  Alethia Berthold, MD 05/14/2017 5:20 PM

## 2017-05-14 NOTE — ED Notes (Signed)

## 2017-05-15 ENCOUNTER — Encounter: Payer: Self-pay | Admitting: *Deleted

## 2017-05-15 ENCOUNTER — Inpatient Hospital Stay
Admission: AD | Admit: 2017-05-15 | Discharge: 2017-05-20 | DRG: 885 | Disposition: A | Discharge status: Home / Self Care | Payer: No Typology Code available for payment source | Attending: Psychiatry | Admitting: Psychiatry

## 2017-05-15 DIAGNOSIS — R45851 Suicidal ideations: Secondary | ICD-10-CM | POA: Diagnosis present

## 2017-05-15 DIAGNOSIS — F1490 Cocaine use, unspecified, uncomplicated: Secondary | ICD-10-CM

## 2017-05-15 DIAGNOSIS — F1410 Cocaine abuse, uncomplicated: Secondary | ICD-10-CM | POA: Diagnosis present

## 2017-05-15 DIAGNOSIS — Z6379 Other stressful life events affecting family and household: Secondary | ICD-10-CM

## 2017-05-15 DIAGNOSIS — F3181 Bipolar II disorder: Principal | ICD-10-CM | POA: Diagnosis present

## 2017-05-15 DIAGNOSIS — B1920 Unspecified viral hepatitis C without hepatic coma: Secondary | ICD-10-CM | POA: Diagnosis present

## 2017-05-15 DIAGNOSIS — F1910 Other psychoactive substance abuse, uncomplicated: Secondary | ICD-10-CM | POA: Diagnosis present

## 2017-05-15 DIAGNOSIS — K0889 Other specified disorders of teeth and supporting structures: Secondary | ICD-10-CM | POA: Diagnosis present

## 2017-05-15 DIAGNOSIS — Z915 Personal history of self-harm: Secondary | ICD-10-CM

## 2017-05-15 DIAGNOSIS — F1523 Other stimulant dependence with withdrawal: Secondary | ICD-10-CM | POA: Diagnosis present

## 2017-05-15 DIAGNOSIS — G4700 Insomnia, unspecified: Secondary | ICD-10-CM | POA: Diagnosis present

## 2017-05-15 DIAGNOSIS — F17200 Nicotine dependence, unspecified, uncomplicated: Secondary | ICD-10-CM | POA: Diagnosis present

## 2017-05-15 DIAGNOSIS — F17210 Nicotine dependence, cigarettes, uncomplicated: Secondary | ICD-10-CM | POA: Diagnosis present

## 2017-05-15 DIAGNOSIS — F1520 Other stimulant dependence, uncomplicated: Secondary | ICD-10-CM

## 2017-05-15 DIAGNOSIS — F1510 Other stimulant abuse, uncomplicated: Secondary | ICD-10-CM

## 2017-05-15 DIAGNOSIS — F1220 Cannabis dependence, uncomplicated: Secondary | ICD-10-CM | POA: Diagnosis present

## 2017-05-15 DIAGNOSIS — F1290 Cannabis use, unspecified, uncomplicated: Secondary | ICD-10-CM

## 2017-05-15 DIAGNOSIS — Z590 Homelessness: Secondary | ICD-10-CM | POA: Diagnosis not present

## 2017-05-15 DIAGNOSIS — F1390 Sedative, hypnotic, or anxiolytic use, unspecified, uncomplicated: Secondary | ICD-10-CM

## 2017-05-15 HISTORY — DX: Bipolar disorder, unspecified: F31.9

## 2017-05-15 LAB — POTASSIUM: Potassium: 3.7 mmol/L (ref 3.5–5.1)

## 2017-05-15 MED ORDER — FLUVOXAMINE MALEATE 50 MG PO TABS
100.0000 mg | ORAL_TABLET | Freq: Every day | ORAL | Status: DC
  Administered 2017-05-15 – 2017-05-16 (×2): 100 mg via ORAL
  Filled 2017-05-15 (×2): qty 2

## 2017-05-15 MED ORDER — LITHIUM CARBONATE 300 MG PO CAPS
300.0000 mg | ORAL_CAPSULE | Freq: Two times a day (BID) | ORAL | Status: DC
  Filled 2017-05-15 (×2): qty 1

## 2017-05-15 MED ORDER — QUETIAPINE FUMARATE 200 MG PO TABS
400.0000 mg | ORAL_TABLET | Freq: Every day | ORAL | Status: DC
  Administered 2017-05-15 – 2017-05-16 (×2): 400 mg via ORAL
  Filled 2017-05-15 (×2): qty 2

## 2017-05-15 MED ORDER — ALUM & MAG HYDROXIDE-SIMETH 200-200-20 MG/5ML PO SUSP: 30.0000 mL | ORAL | Status: DC | PRN

## 2017-05-15 MED ORDER — HYDROXYZINE HCL 50 MG PO TABS: 50.0000 mg | ORAL_TABLET | Freq: Three times a day (TID) | ORAL | Status: DC | PRN

## 2017-05-15 MED ORDER — MAGNESIUM HYDROXIDE 400 MG/5ML PO SUSP: 30.0000 mL | Freq: Every day | ORAL | Status: DC | PRN

## 2017-05-15 MED ORDER — ACETAMINOPHEN 325 MG PO TABS
650.0000 mg | ORAL_TABLET | Freq: Four times a day (QID) | ORAL | Status: DC | PRN
  Administered 2017-05-18: 650 mg via ORAL
  Filled 2017-05-15: qty 2

## 2017-05-15 NOTE — [facility] Group Notes (Signed)
[facility] Group Notes:  (Nursing/MHT/Case Management/Adjunct)  Date:  05/15/2017  Time:  10:09 PM  Type of Therapy:  Evening Wrap-up Group  Participation Level:  Did Not Attend  Participation Quality:  N/A  Affect:  N/A  Cognitive:  N/A  Insight:  None  Engagement in Group:  Did  Not Attend  Modes of Intervention:  Discussion  Summary of Progress/Problems:  Vincent MorrowChelsea Rosales [name] 05/15/2017, 10:09 PM

## 2017-05-15 NOTE — H&P (Signed)
Psychiatric Admission Assessment Adult  Patient Identification: [name] MRN:  [id number] Date of Evaluation:  05/15/2017 Chief Complaint:  Bipolar 2 Disorder, major depressive episode Principal Diagnosis: <principal problem not specified> Diagnosis:   Patient Active Problem List   Diagnosis Date Noted  . Bipolar 2 disorder, major depressive episode ([place]) [F31.81] 05/15/2017  . Amphetamine abuse [F15.10] 05/14/2017  . Hepatitis C [B19.20] 05/14/2017  . Cellulitis [L03.90] 01/21/2017  . Tobacco use disorder [F17.200] 10/16/2016  . Bipolar 2 disorder ([place]) [F31.81] 10/15/2016  . Cannabis use disorder, moderate, dependence ([place]) [F12.20] 10/15/2016  . Suicidal ideation [R45.851] 10/15/2016   History of Present Illness: [name] is an 41 y.o. male who presents to the ER due to voicing SI. Patient came voluntarily and stated that he had been awake for 10 days smoking methamphetamine. He reported having poor sleep and appetite. Stated that his mood was significantly getting worse. Patient states he attempted to end his life by overdosing on drugs. He states, "I took enough (illicit) drugs to kill myself but I didn't work." He further reports, he has "binged on Crystal Meth (Methamphetamine), Cocaine, Cannabis & Pills (Benzo's) for ten days and ain't had no sleep."   Stressors he has identified includes; risk of homelessness, family problems and untreated mental illness. He states, "drugs aint' my number one problem but  my Bipolar is the issue. I use drugs to help me with it. Cause if I wanted to stop, I'll just stop."  In th eER patient was refusing lithium saying it made him drowsy and was not really helpful.  [name] is lying in bed and reports he is cold. He reports being depressed and feeling hopeless. He is able to contract for safety on the unit. Denies HI/AH [place].  Total Time spent with patient: 1 hour  Past Psychiatric History: Patient has had  admissions in the past for suicidal ideation and self injury with a diagnosis of bipolar disorder. Was last discharged on a combination of Seroquel and lithium and Luvox. Has not been following up at all  Is the patient at risk to self? Yes.    Has the patient been a risk to self in the past 6 months? Yes.    Has the patient been a risk to self within the distant past? Yes.    Is the patient a risk to others? No.  Has the patient been a risk to others in the past 6 months? No.  Has the patient been a risk to others within the distant past? No.   Prior Inpatient Therapy:  yes Prior Outpatient Therapy:  yes  Alcohol Screening: 1. How often do you have a drink containing alcohol?: 2 to 4 times a month 2. How many drinks containing alcohol do you have on a typical day when you are drinking?: 10 or more 3. How often do you have six or more drinks on one occasion?: Monthly Preliminary Score: 6 4. How often during the last year have you found that you were not able to stop drinking once you had started?: Never 5. How often during the last year have you failed to do what was normally expected from you becasue of drinking?: Never 6. How often during the last year have you needed a first drink in the morning to get yourself going after a heavy drinking session?: Never 7. How often during the last year have you had a feeling of guilt of remorse after drinking?: Never 8.  How often during the last year have you been unable to remember what happened the night before because you had been drinking?: Never 9. Have you or someone else been injured as a result of your drinking?: No 10. Has a relative or friend or a doctor or another health worker been concerned about your drinking or suggested you cut down?: No Alcohol Use Disorder Identification Test Final Score (AUDIT): 8 Brief Intervention: Patient declined brief intervention Substance Abuse History in the last 12 months:  Yes.   Consequences of Substance  Abuse: Medical Consequences:  Hepatitis C positive Family Consequences:  Homelessness and conflict with family Previous Psychotropic Medications: Yes  Psychological Evaluations: No  Past Medical History:  Past Medical History:  Diagnosis Date  . Bipolar disorder ([place])   . Depression   . Hepatitis C   . Sinus congestion     Past Surgical History:  Procedure Laterality Date  . ANKLE SURGERY    . HERNIA REPAIR    . HERNIA REPAIR    . TENDON REPAIR     Family History:  Family History  Problem Relation Age of Onset  . Heart failure Mother    Family Psychiatric  History: History of anxiety Tobacco Screening: Have you used any form of tobacco in the last 30 days? (Cigarettes, Smokeless Tobacco, Cigars, and/or Pipes): Yes Tobacco use, Select all that apply: 5 or more cigarettes per day Are you interested in Tobacco Cessation Medications?: No, patient refused Counseled patient on smoking cessation including recognizing danger situations, developing coping skills and basic information about quitting provided: Yes Social History:  History  Alcohol Use  . 7.2 oz/week  . 12 Cans of beer per week    Comment: reports weekend use (fri/sat night)     History  Drug Use  . Frequency: 3.0 times per week  . Types: Marijuana, Methamphetamines    Comment: Pt states that he last used meth yesterday morning     Additional Social History:Patient was living with his mother      History of alcohol / drug use?: Yes   Allergies:  No Known Allergies Lab Results:  Results for orders placed or performed during the hospital encounter of 05/14/17 (from the past 48 hour(s))  Comprehensive metabolic panel     Status: Abnormal   Collection Time: 05/14/17  3:21 PM  Result Value Ref Range   Sodium 136 135 - 145 mmol/L   Potassium 3.0 (L) 3.5 - 5.1 mmol/L   Chloride 103 101 - 111 mmol/L   CO2 25 22 - 32 mmol/L   Glucose, Bld 104 (H) 65 - 99 mg/dL   BUN 22 (H) 6 - 20 mg/dL   Creatinine, Ser 1.09  0.61 - 1.24 mg/dL   Calcium 8.9 8.9 - 10.3 mg/dL   Total Protein 6.9 6.5 - 8.1 g/dL   Albumin 3.9 3.5 - 5.0 g/dL   AST 105 (H) 15 - 41 U/L   ALT 131 (H) 17 - 63 U/L   Alkaline Phosphatase 60 38 - 126 U/L   Total Bilirubin 0.6 0.3 - 1.2 mg/dL   GFR calc non Af Amer >60 >60 mL/min   GFR calc Af Amer >60 >60 mL/min    Comment: (NOTE) The eGFR has been calculated using the CKD EPI equation. This calculation has not been validated in all clinical situations. eGFR's persistently <60 mL/min signify possible Chronic Kidney Disease.    Anion gap 8 5 - 15  Ethanol     Status: None  Collection Time: 05/14/17  3:21 PM  Result Value Ref Range   Alcohol, Ethyl (B) <5 <5 mg/dL    Comment:        LOWEST DETECTABLE LIMIT FOR SERUM ALCOHOL IS 5 mg/dL FOR MEDICAL PURPOSES ONLY   Salicylate level     Status: None   Collection Time: 05/14/17  3:21 PM  Result Value Ref Range   Salicylate Lvl 63.1 2.8 - 30.0 mg/dL  Acetaminophen level     Status: Abnormal   Collection Time: 05/14/17  3:21 PM  Result Value Ref Range   Acetaminophen (Tylenol), Serum <10 (L) 10 - 30 ug/mL    Comment:        THERAPEUTIC CONCENTRATIONS VARY SIGNIFICANTLY. A RANGE OF 10-30 ug/mL MAY BE AN EFFECTIVE CONCENTRATION FOR MANY PATIENTS. HOWEVER, SOME ARE BEST TREATED AT CONCENTRATIONS OUTSIDE THIS RANGE. ACETAMINOPHEN CONCENTRATIONS >150 ug/mL AT 4 HOURS AFTER INGESTION AND >50 ug/mL AT 12 HOURS AFTER INGESTION ARE OFTEN ASSOCIATED WITH TOXIC REACTIONS.   cbc     Status: Abnormal   Collection Time: 05/14/17  3:21 PM  Result Value Ref Range   WBC 11.7 (H) 3.8 - 10.6 K/uL   RBC 4.55 4.40 - 5.90 MIL/uL   Hemoglobin 13.2 13.0 - 18.0 g/dL   HCT 38.6 (L) 40.0 - 52.0 %   MCV 84.9 80.0 - 100.0 fL   MCH 29.1 26.0 - 34.0 pg   MCHC 34.2 32.0 - 36.0 g/dL   RDW 13.3 11.5 - 14.5 %   Platelets 280 150 - 440 K/uL  Urine Drug Screen, Qualitative     Status: Abnormal   Collection Time: 05/14/17  3:21 PM  Result Value Ref  Range   Tricyclic, Ur Screen NONE DETECTED NONE DETECTED   Amphetamines, Ur Screen POSITIVE (A) NONE DETECTED   MDMA (Ecstasy)Ur Screen NONE DETECTED NONE DETECTED   Cocaine Metabolite,Ur [place] NONE DETECTED NONE DETECTED   Opiate, Ur Screen NONE DETECTED NONE DETECTED   Phencyclidine (PCP) Ur S NONE DETECTED NONE DETECTED   Cannabinoid 50 Ng, Ur [place] NONE DETECTED NONE DETECTED   Barbiturates, Ur Screen NONE DETECTED NONE DETECTED   Benzodiazepine, Ur Scrn NONE DETECTED NONE DETECTED   Methadone Scn, Ur NONE DETECTED NONE DETECTED    Comment: (NOTE) 497  Tricyclics, urine               Cutoff 1000 ng/mL 200  Amphetamines, urine             Cutoff 1000 ng/mL 300  MDMA (Ecstasy), urine           Cutoff 500 ng/mL 400  Cocaine Metabolite, urine       Cutoff 300 ng/mL 500  Opiate, urine                   Cutoff 300 ng/mL 600  Phencyclidine (PCP), urine      Cutoff 25 ng/mL 700  Cannabinoid, urine              Cutoff 50 ng/mL 800  Barbiturates, urine             Cutoff 200 ng/mL 900  Benzodiazepine, urine           Cutoff 200 ng/mL 1000 Methadone, urine                Cutoff 300 ng/mL 1100 1200 The urine drug screen provides only a preliminary, unconfirmed 1300 analytical test result and should not be used for non-medical 1400 purposes. Clinical  consideration and professional judgment should 1500 be applied to any positive drug screen result due to possible 1600 interfering substances. A more specific alternate chemical method 1700 must be used in order to obtain a confirmed analytical result.  1800 Gas chromato graphy / mass spectrometry (GC/MS) is the preferred 1900 confirmatory method.     Blood Alcohol level:  Lab Results  Component Value Date   [facility] <5 05/14/2017   ETH <5 87/56/4332    Metabolic Disorder Labs:  Lab Results  Component Value Date   HGBA1C 5.6 10/16/2016   MPG 114 10/16/2016   Lab Results  Component Value Date   PROLACTIN 13.9 10/16/2016   Lab Results   Component Value Date   CHOL 171 10/16/2016   TRIG 146 10/16/2016   HDL 52 10/16/2016   CHOLHDL 3.3 10/16/2016   VLDL 29 10/16/2016   LDLCALC 90 10/16/2016    Current Medications: Current Facility-Administered Medications  Medication Dose Route Frequency Provider Last Rate Last Dose  . acetaminophen (TYLENOL) tablet 650 mg  650 mg Oral Q6H PRN Clapacs, John T, MD      . alum & mag hydroxide-simeth (MAALOX/MYLANTA) 200-200-20 MG/5ML suspension 30 mL  30 mL Oral Q4H PRN Clapacs, John T, MD      . fluvoxaMINE (LUVOX) tablet 100 mg  100 mg Oral QHS Clapacs, John T, MD      . hydrOXYzine (ATARAX/VISTARIL) tablet 50 mg  50 mg Oral TID PRN Clapacs, John T, MD      . lithium carbonate capsule 300 mg  300 mg Oral BID WC Clapacs, John T, MD      . magnesium hydroxide (MILK OF MAGNESIA) suspension 30 mL  30 mL Oral Daily PRN Clapacs, John T, MD      . QUEtiapine (SEROQUEL) tablet 400 mg  400 mg Oral QHS Clapacs, John T, MD       PTA Medications: No prescriptions prior to admission.    Musculoskeletal: Strength & Muscle Tone: within normal limits Gait & Station: normal Patient leans: N/A  Psychiatric Specialty Exam: Physical Exam  Per exam in ER, patient is medically stable.  ROS   Blood pressure 110/78, pulse 87, temperature 98.6 F (37 C), temperature source Oral, resp. rate 18, height _0  (1.854 m), weight 168 lb (76.2 kg), SpO2 98 %.Body mass index is 22.16 kg/m.  General Appearance: Casual  Eye Contact:  Minimal  Speech:  Garbled  Volume:  Decreased  Mood:  Anxious and Depressed  Affect:  Constricted, Depressed and Labile  Thought Process:  Goal Directed  Orientation:  Full (Time, Place, and Person)  Thought Content:  WDL and Rumination  Suicidal Thoughts:  Yes.  with intent/plan  Homicidal Thoughts:  No  Memory:  Immediate;   Fair Recent;   Poor Remote;   Poor  Judgement:  Poor  Insight:  Lacking  Psychomotor Activity:  Decreased  Concentration:  Concentration: Poor   Recall:  [place] of Knowledge:  Fair  Language:  Fair  Akathisia:  No  Handed:  Right  AIMS (if indicated):     Assets:  Desire for Improvement  ADL's:  Impaired  Cognition:  Impaired,  Mild  Sleep:       Treatment Plan Summary: Daily contact with patient to assess and evaluate symptoms and progress in treatment and Medication management   Patient is a 41 year old male with a long history of substance abuse and bipolar disorder. His drug screen was positive for amphetamines. Patient presenting with  suicidal thoughts of having overdosed on several substances and being very hopeless.  Major depressive disorder- Continue luvox at 141m po qd.  Bipolar disorder- Continue seroquel at 4064mpo qhs. Continue Lithium at 30076mo bid. Obtain lithium level prior to discharge.  Substance abuse- continue to monitor and recommend residential treatment.  Labs- urine drug screen positive for amphetamines, Liver function tests show elevated AST and ALT, BAL is less than 5, WBC elevated Potassium level low at 3.0, will repeat.  Discharge- per primary team, once patient is stable.   Physician Treatment Plan for Primary Diagnosis: Substance induced mood disorder Long Term Goal(s): Improvement in symptoms so as ready for discharge  Short Term Goals: Ability to identify changes in lifestyle to reduce recurrence of condition will improve, Ability to verbalize feelings will improve, Ability to disclose and discuss suicidal ideas, Ability to demonstrate self-control will improve, Ability to identify and develop effective coping behaviors will improve, Ability to maintain clinical measurements within normal limits will improve, Compliance with prescribed medications will improve and Ability to identify triggers associated with substance abuse/mental health issues will improve  Physician Treatment Plan for Secondary Diagnosis: Active Problems:   Bipolar 2 disorder, major depressive episode  [place](s): Improvement in symptoms so as ready for discharge  Short Term Goals: Ability to identify changes in lifestyle to reduce recurrence of condition will improve, Ability to verbalize feelings will improve, Ability to disclose and discuss suicidal ideas, Ability to demonstrate self-control will improve, Ability to identify and develop effective coping behaviors will improve, Ability to maintain clinical measurements within normal limits will improve, Compliance with prescribed medications will improve and Ability to identify triggers associated with substance abuse/mental health issues will improve  I certify that inpatient services furnished can reasonably be expected to improve the patient's condition.    RAVElvin SoD 8/11/20186:57 PM

## 2017-05-15 NOTE — ED Notes (Signed)
Report to [facility] charge RN by TTS of patient condition.  Awaiting placement decision.

## 2017-05-15 NOTE — BH Assessment (Signed)
[facility] Dr. Toni Amendlapacs.  Attending Physician will be Dr. Ardyth HarpsHernandez.   Patient [facility] Jamesetta SoPhyllis, RN.   Intake Paper Work has been signed and placed on patient chart.  ER staff is aware of the admission [facility].; Dr. Roxan Hockeyobinson, ER MD; Carlisle BeersLuann Patient's Nurse).

## 2017-05-15 NOTE — ED Provider Notes (Signed)
-----------------------------------------   6:20 AM on 05/15/2017 -----------------------------------------   Blood pressure 117/69, pulse 62, temperature 97.7 F (36.5 C), temperature source Oral, resp. rate 18, height 6\' 1"  (1.854 m), weight 81.6 kg (180 lb), SpO2 96 %.  The patient had no acute events since last update.  Sleeping at this time.  Disposition is pending Psychiatry/Behavioral Medicine team recommendations.     Irean HongSung, Tayanna Talford J, MD 05/15/17 [phone]

## 2017-05-15 NOTE — Progress Notes (Signed)
Dr. Daleen Boavi called, informed that patient has low potassium level. Verbal order to obtain internal medicine consult.

## 2017-05-15 NOTE — [facility] Group Notes (Signed)
[facility] LCSW Group Therapy  05/15/2017 2:48 PM  Type of Therapy:  Group Therapy  Participation Level:  Patient did not attend group. Patient was admitted to unit during group.   Summary of Progress/Problems:   [name] G. [name] MSW, LCSWA 05/15/2017, 2:49 PM

## 2017-05-15 NOTE — Progress Notes (Signed)
41 year old admitted with bipolar 2 disorder and major depressive episode.  Presented to unit in scrubs.  Affect anxious.  Speech tangential and has flight of ideas.  Verbalizes "I need help, I was here before and did not get anything done"   Body search and skin assessment performed with Tourist information centre managerTakencia RN.  No contraband found.  Multiple tattoos noted to upper body.  Multiple abrasions and scratches noted to arms and legs.  Patient reports that got scratches from running through the briars.

## 2017-05-15 NOTE — BH Assessment (Signed)
[facility]

## 2017-05-15 NOTE — BH Assessment (Signed)
Pt under review by Tour managerBMU Charge RN.

## 2017-05-15 NOTE — [facility] Suicide Risk Assessment (Signed)
[facility] Admission Suicide Risk Assessment   Nursing information obtained from:    Demographic factors:   Patient is a 41 yo caucasian male. Current Mental Status:   Patient is drowsy, states he is depressed and hopeless. Loss Factors:    Historical Factors:    Risk Reduction Factors:     Total Time spent with patient: 1 hour Principal Problem: <principal problem not specified> Diagnosis:   Patient Active Problem List   Diagnosis Date Noted  . Bipolar 2 disorder, major depressive episode (HCC) [F31.81] 05/15/2017  . Amphetamine abuse [F15.10] 05/14/2017  . Hepatitis C [B19.20] 05/14/2017  . Cellulitis [L03.90] 01/21/2017  . Tobacco use disorder [F17.200] 10/16/2016  . Bipolar 2 disorder (HCC) [F31.81] 10/15/2016  . Cannabis use disorder, moderate, dependence (HCC) [F12.20] 10/15/2016  . Suicidal ideation [R45.851] 10/15/2016   Subjective Data: He shouldn't is a 41 year old Caucasian male who reports that he overdosed on a lot of the substances he was using in a suicide attempt due to hopelessness and depression.  Continued Clinical Symptoms:    The "Alcohol Use Disorders Identification Test", Guidelines for Use in Primary Care, Second Edition.  World Science writer [facility]). Score between 0-7:  no or low risk or alcohol related problems. Score between 8-15:  moderate risk of alcohol related problems. Score between 16-19:  high risk of alcohol related problems. Score 20 or above:  warrants further diagnostic evaluation for alcohol dependence and treatment.   CLINICAL FACTORS:   Depression:   Comorbid alcohol abuse/dependence   Musculoskeletal: Strength & Muscle Tone: within normal limits Gait & Station: normal Patient leans: N/A  Psychiatric Specialty Exam: Physical Exam  ROS   Blood pressure 110/78, pulse 87, temperature 98.6 F (37 C), temperature source Oral, resp. rate 18, height 6\' 1"  (1.854 m), weight 168 lb (76.2 kg), SpO2 98 %.Body mass index is 22.16 kg/m.   General Appearance: Casual  Eye Contact:  Minimal  Speech:  Garbled  Volume:  Decreased  Mood:  Anxious and Depressed  Affect:  Constricted, Depressed and Labile  Thought Process:  Goal Directed  Orientation:  Full (Time, Place, and Person)  Thought Content:  WDL and Rumination  Suicidal Thoughts:  Yes.  with intent/plan  Homicidal Thoughts:  No  Memory:  Immediate;   Fair Recent;   Poor Remote;   Poor  Judgement:  Poor  Insight:  Lacking  Psychomotor Activity:  Decreased  Concentration:  Concentration: Poor  Recall:  Fair  Fund of Knowledge:  Fair  Language:  Fair  Akathisia:  No  Handed:  Right  AIMS (if indicated):     Assets:  Desire for Improvement  ADL's:  Impaired  Cognition:  Impaired,  Mild  Sleep:       Treatment Plan Summary: Daily contact with patient to assess and evaluate symptoms and progress in treatment and Medication management   Patient is a 41 year old male with a long history of substance abuse and bipolar disorder. His drug screen was positive for amphetamines. Patient presenting with suicidal thoughts of having overdosed on several substances and being very hopeless.  Major depressive disorder- Continue luvox at 100mg  po qd.  Bipolar disorder- Continue seroquel at 400mg  po qhs. Continue Lithium at 300mg  po bid. Obtain lithium level prior to discharge.  Substance abuse- continue to monitor and recommend residential treatment.  Labs- urine drug screen positive for amphetamines, Liver function tests show elevated AST and ALT, BAL is less than 5, WBC elevated Potassium level low at 3.0, will repeat.  Discharge- per primary team, once patient is stable.   Physician Treatment Plan for Primary Diagnosis: Substance induced mood disorder Long Term Goal(s): Improvement in symptoms so as ready for discharge  Short Term Goals: Ability to identify changes in lifestyle to reduce recurrence of condition will improve, Ability to verbalize  feelings will improve, Ability to disclose and discuss suicidal ideas, Ability to demonstrate self-control will improve, Ability to identify and develop effective coping behaviors will improve, Ability to maintain clinical measurements within normal limits will improve, Compliance with prescribed medications will improve and Ability to identify triggers associated with substance abuse/mental health issues will improve  Physician Treatment Plan for Secondary Diagnosis: Active Problems:   Bipolar 2 disorder, major depressive episode (HCC)  Long Term Goal(s): Improvement in symptoms so as ready for discharge  Short Term Goals: Ability to identify changes in lifestyle to reduce recurrence of condition will improve, Ability to verbalize feelings will improve, Ability to disclose and discuss suicidal ideas, Ability to demonstrate self-control will improve, Ability to identify and develop effective coping behaviors will improve, Ability to maintain clinical measurements within normal limits will improve, Compliance with prescribed medications will improve and Ability to identify triggers associated with substance abuse/mental health issues will improve    COGNITIVE FEATURES THAT CONTRIBUTE TO RISK:  Thought constriction (tunnel vision)    SUICIDE RISK:   Moderate:  Frequent suicidal ideation with limited intensity, and duration, some specificity in terms of plans, no associated intent, good self-control, limited dysphoria/symptomatology, some risk factors present, and identifiable protective factors, including available and accessible social support.  PLAN OF CARE: see above  I certify that inpatient services furnished can reasonably be expected to improve the patient's condition.   Patrick NorthAVI, Veleria Barnhardt, MD 05/15/2017, 12:52 PM

## 2017-05-16 LAB — LIPID PANEL
CHOL/HDL RATIO: 2.7 ratio
Cholesterol: 122 mg/dL (ref 0–200)
HDL: 45 mg/dL (ref >40)
LDL Cholesterol: 57 mg/dL (ref 0–99)
TRIGLYCERIDES: 101 mg/dL (ref <150)
VLDL: 20 mg/dL (ref 0–40)

## 2017-05-16 LAB — TSH: TSH: 0.177 u[IU]/mL — ABNORMAL LOW (ref 0.350–4.500)

## 2017-05-16 MED ORDER — ENSURE ENLIVE PO LIQD
237.0000 mL | Freq: Three times a day (TID) | ORAL | Status: DC
  Administered 2017-05-16 – 2017-05-20 (×11): 237 mL via ORAL

## 2017-05-16 MED ORDER — ADULT MULTIVITAMIN W/MINERALS CH
1.0000 | ORAL_TABLET | Freq: Every day | ORAL | Status: DC
Start: 2017-05-16 — End: 2017-05-20
  Administered 2017-05-16 – 2017-05-20 (×5): 1 via ORAL
  Filled 2017-05-16 (×5): qty 1

## 2017-05-16 NOTE — [facility] Group Notes (Signed)
[facility] LCSW Group Therapy  05/16/2017 3:48 PM  Type of Therapy:  Group Therapy  Participation Level:  Patient did not attend group. CSW invited patient to group.   Summary of Progress/Problems:  [name] MSW, LCSWA 05/16/2017, 3:48 PM

## 2017-05-16 NOTE — Plan of Care (Signed)
Problem: Activity: Goal: Interest or engagement in activities will improve Outcome: Not Progressing Isolates to room except during meals.  No interaction with peers during meal time.  Minimal interaction with peers.  No group attendance.

## 2017-05-16 NOTE — Plan of Care (Signed)
Problem: Coping: Goal: Ability to cope will improve Outcome: Not Progressing Patient has been observed in bed most of this evening.  On contact, he responded to direction to come out for snack or medications. He focused on the scratches and bug bites on his legs, ankles and arms and was given body cream to help.  He denied current thoughts of self harm and contracted for safety.  He blames the medication "for making me so groggy" and denies having withdrawal symptoms.  Problem: Activity: Goal: Interest or engagement in activities will improve Outcome: Not Progressing Patient refused to participate in dayroom activities but did come out for snack and medications.  He blames the "medications for making me want to stay in bed".  Safety is maintained.

## 2017-05-16 NOTE — Progress Notes (Signed)
[facility] MD Progress Note  05/16/2017 1:51 PM [name]  MRN:  [id number] Subjective:  Patient was seen in his room this morning. He continues to lie down and not very forthcoming. States he is doing okay. Endorsing  okay sleep. Not willing to talk about his mood. Denies any current suicidal thoughts and is able to contract for safety. He however states that he does not want to take the lithium since it has not done anything for him and affects his liver. Tried to discuss with him that it affects his kidneys mainly. However patient not responding to that. Feels like that the Seroquel also does not help him but he is willing to take it. When cooperative on the unit and mostly staying to himself. Principal Problem: <principal problem not specified> Diagnosis:   Patient Active Problem List   Diagnosis Date Noted  . Bipolar 2 disorder, major depressive episode (HCC) [F31.81] 05/15/2017  . Amphetamine abuse [F15.10] 05/14/2017  . Hepatitis C [B19.20] 05/14/2017  . Cellulitis [L03.90] 01/21/2017  . Tobacco use disorder [F17.200] 10/16/2016  . Bipolar 2 disorder (HCC) [F31.81] 10/15/2016  . Cannabis use disorder, moderate, dependence (HCC) [F12.20] 10/15/2016  . Suicidal ideation [R45.851] 10/15/2016   Total Time spent with patient: 30 minutes  Past Psychiatric History: Patient has had admissions in the past for suicidal ideation and self injury with a diagnosis of bipolar disorder. Was last discharged on a combination of Seroquel and lithium and Luvox. Has not been following up at all  Past Medical History:  Past Medical History:  Diagnosis Date  . Bipolar disorder (HCC)   . Depression   . Hepatitis C   . Sinus congestion     Past Surgical History:  Procedure Laterality Date  . ANKLE SURGERY    . HERNIA REPAIR    . HERNIA REPAIR    . TENDON REPAIR     Family History:  Family History  Problem Relation Age of Onset  . Heart failure Mother    Family Psychiatric  History:  unknown Social History:  History  Alcohol Use  . 7.2 oz/week  . 12 Cans of beer per week    Comment: reports weekend use (fri/sat night)     History  Drug Use  . Frequency: 3.0 times per week  . Types: Marijuana, Methamphetamines    Comment: Pt states that he last used meth yesterday morning     Social History   Social History  . Marital status: Single    Spouse name: N/A  . Number of children: N/A  . Years of education: N/A   Social History Main Topics  . Smoking status: Current Every Day Smoker    Packs/day: 1.00    Types: Cigarettes  . Smokeless tobacco: Never Used  . Alcohol use 7.2 oz/week    12 Cans of beer per week     Comment: reports weekend use (fri/sat night)  . Drug use: Yes    Frequency: 3.0 times per week    Types: Marijuana, Methamphetamines     Comment: Pt states that he last used meth yesterday morning   . Sexual activity: Not Asked   Other Topics Concern  . None   Social History Narrative  . None   Additional Social History:    History of alcohol / drug use?: Yes Name of Substance 1: "Crystal Meth (Methamphines)" Name of Substance 2: Cocaine Name of Substance 3: Cannabis Name of Substance 4: "Pills (Benzo's)"  Sleep: Fair  Appetite:  Fair  Current Medications: Current Facility-Administered Medications  Medication Dose Route Frequency Provider Last Rate Last Dose  . acetaminophen (TYLENOL) tablet 650 mg  650 mg Oral Q6H PRN Clapacs, John T, MD      . alum & mag hydroxide-simeth (MAALOX/MYLANTA) 200-200-20 MG/5ML suspension 30 mL  30 mL Oral Q4H PRN Clapacs, John T, MD      . feeding supplement (ENSURE ENLIVE) (ENSURE ENLIVE) liquid 237 mL  237 mL Oral TID BM Jimmy Footman, MD      . fluvoxaMINE (LUVOX) tablet 100 mg  100 mg Oral QHS Clapacs, John T, MD   100 mg at 05/15/17 2150  . hydrOXYzine (ATARAX/VISTARIL) tablet 50 mg  50 mg Oral TID PRN Clapacs, John T, MD      . lithium carbonate capsule 300 mg  300 mg  Oral BID WC Clapacs, John T, MD      . magnesium hydroxide (MILK OF MAGNESIA) suspension 30 mL  30 mL Oral Daily PRN Clapacs, Jackquline Denmark, MD      . multivitamin with minerals tablet 1 tablet  1 tablet Oral Daily Jimmy Footman, MD      . QUEtiapine (SEROQUEL) tablet 400 mg  400 mg Oral QHS Clapacs, Jackquline Denmark, MD   400 mg at 05/15/17 2150    Lab Results:  Results for orders placed or performed during the hospital encounter of 05/15/17 (from the past 48 hour(s))  Potassium     Status: None   Collection Time: 05/15/17  8:34 PM  Result Value Ref Range   Potassium 3.7 3.5 - 5.1 mmol/L  Lipid panel     Status: None   Collection Time: 05/16/17  6:48 AM  Result Value Ref Range   Cholesterol 122 0 - 200 mg/dL   Triglycerides 829 <562 mg/dL   HDL 45 >13 mg/dL   Total CHOL/HDL Ratio 2.7 RATIO   VLDL 20 0 - 40 mg/dL   LDL Cholesterol 57 0 - 99 mg/dL    Comment:        Total Cholesterol/HDL:CHD Risk Coronary Heart Disease Risk Table                     Men   Women  1/2 Average Risk   3.4   3.3  Average Risk       5.0   4.4  2 X Average Risk   9.6   7.1  3 X Average Risk  23.4   11.0        Use the calculated Patient Ratio above and the CHD Risk Table to determine the patient's CHD Risk.        ATP III CLASSIFICATION (LDL):  <100     mg/dL   Optimal  086-578  mg/dL   Near or Above                    Optimal  130-159  mg/dL   Borderline  469-629  mg/dL   High  >528     mg/dL   Very High   TSH     Status: Abnormal   Collection Time: 05/16/17  6:48 AM  Result Value Ref Range   TSH 0.177 (L) 0.350 - 4.500 uIU/mL    Comment: Performed by a 3rd Generation assay with a functional sensitivity of <=0.01 uIU/mL.    Blood Alcohol level:  Lab Results  Component Value Date   [facility] <5 05/14/2017   ETH <5  10/14/2016    Metabolic Disorder Labs: Lab Results  Component Value Date   HGBA1C 5.6 10/16/2016   MPG 114 10/16/2016   Lab Results  Component Value Date   PROLACTIN 13.9  10/16/2016   Lab Results  Component Value Date   CHOL 122 05/16/2017   TRIG 101 05/16/2017   HDL 45 05/16/2017   CHOLHDL 2.7 05/16/2017   VLDL 20 05/16/2017   LDLCALC 57 05/16/2017   LDLCALC 90 10/16/2016    Physical Findings: AIMS: Facial and Oral Movements Muscles of Facial Expression: None, normal Lips and Perioral Area: None, normal Jaw: None, normal Tongue: None, normal,Extremity Movements Upper (arms, wrists, hands, fingers): None, normal Lower (legs, knees, ankles, toes): None, normal, Trunk Movements Neck, shoulders, hips: None, normal, Overall Severity Severity of abnormal movements (highest score from questions above): None, normal Incapacitation due to abnormal movements: None, normal Patient's awareness of abnormal movements (rate only patient's report): No Awareness, Dental Status Current problems with teeth and/or dentures?: Yes Does patient usually wear dentures?: No  CIWA:  CIWA-Ar Total: 0 COWS:  COWS Total Score: 2  Musculoskeletal: Strength & Muscle Tone: within normal limits Gait & Station: normal Patient leans: N/A  Psychiatric Specialty Exam: Physical Exam  ROS  Blood pressure 109/88, pulse 100, temperature 97.6 F (36.4 C), temperature source Oral, resp. rate 20, height 6\' 1"  (1.854 m), weight 168 lb (76.2 kg), SpO2 98 %.Body mass index is 22.16 kg/m.  General Appearance: Casual  Eye Contact:  Minimal  Speech:  Garbled  Volume:  Decreased  Mood:  Anxious, Depressed and Irritable  Affect:  Constricted  Thought Process:  Goal Directed  Orientation:  Full (Time, Place, and Person)  Thought Content:  Logical  Suicidal Thoughts:  Yes.  with intent/plan  Homicidal Thoughts:  No  Memory:  Immediate;   Fair Recent;   Fair Remote;   Fair  Judgement:  Impaired  Insight:  Shallow  Psychomotor Activity:  Decreased  Concentration:  Concentration: Fair and Attention Span: Fair  Recall:  FiservFair  Fund of Knowledge:  Fair  Language:  Fair  Akathisia:   No  Handed:  Right  AIMS (if indicated):     Assets:  Desire for Improvement  ADL's:  Intact  Cognition:  WNL  Sleep:  Number of Hours: 7.3     Treatment Plan Summary: Daily contact with patient to assess and evaluate symptoms and progress in treatment and Medication management  Patient is a 41 year old male with a long history of substance abuse and bipolar disorder. His drug screen was positive for amphetamines. Patient presenting with suicidal thoughts of having overdosed on several substances and being very hopeless.  Major depressive disorder- Continue luvox at 100mg  po qd.  Bipolar disorder- Continue seroquel at 400mg  po qhs. Discontinue lithium since patient is not willing to take any. Consider starting another mood stabilizes once patient has completed detox from the substances  Substance abuse- continue to monitor and recommend residential treatment.  Labs- urine drug screen positive for amphetamines, Liver function tests show elevated AST and ALT, BAL is less than 5, WBC elevated Potassium level normal at 3.7  Discharge- per primary team, once patient is stable.   Physician Treatment Plan for Primary Diagnosis: Substance induced mood disorder Long Term Goal(s): Improvement in symptoms so as ready for discharge  Short Term Goals: Ability to identify changes in lifestyle to reduce recurrence of condition will improve, Ability to verbalize feelings will improve, Ability to disclose and discuss suicidal ideas, Ability to  demonstrate self-control will improve, Ability to identify and develop effective coping behaviors will improve, Ability to maintain clinical measurements within normal limits will improve, Compliance with prescribed medications will improve and Ability to identify triggers associated with substance abuse/mental health issues will improve  Physician Treatment Plan for Secondary Diagnosis: Active Problems:   Bipolar 2 disorder, major depressive episode  (HCC)  Long Term Goal(s): Improvement in symptoms so as ready for discharge  Short Term Goals: Ability to identify changes in lifestyle to reduce recurrence of condition will improve, Ability to verbalize feelings will improve, Ability to disclose and discuss suicidal ideas, Ability to demonstrate self-control will improve, Ability to identify and develop effective coping behaviors will improve, Ability to maintain clinical measurements within normal limits will improve, Compliance with prescribed medications will improve and Ability to identify triggers associated with substance abuse/mental health issues will improve   Patrick North, MD 05/16/2017, 1:51 PM

## 2017-05-16 NOTE — Tx Team (Signed)
Initial Treatment Plan 05/16/2017 12:21 AM Paulla Dollyommy Joe Montesinos ZOX:[id number]    PATIENT STRESSORS: Financial difficulties Substance abuse   PATIENT STRENGTHS: Barrister's clerkCommunication skills Motivation for treatment/growth   PATIENT IDENTIFIED PROBLEMS:                      DISCHARGE CRITERIA:  Adequate post-discharge living arrangements Motivation to continue treatment in a less acute level of care  PRELIMINARY DISCHARGE PLAN: Outpatient therapy Placement in alternative living arrangements  PATIENT/FAMILY INVOLVEMENT: This treatment plan has been presented to and reviewed with the patient, Paulla Dollyommy Joe Galbreath.  The patient has been given the opportunity to ask questions and make suggestions.  Milon ScoreValinda Alyse Kathan, RN 05/16/2017, 12:21 AM
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
## 2017-05-16 NOTE — Progress Notes (Signed)
Initial Nutrition Assessment  DOCUMENTATION CODES:   Underweight  INTERVENTION:   Ensure Enlive po TID, each supplement provides 350 kcal and 20 grams of protein  MVI  NUTRITION DIAGNOSIS:   Unintentional weight loss related to  (Bipolar disorder and substance abuse) as evidenced by 12 percent weight loss in 4 months.  GOAL:   Patient will meet greater than or equal to 90% of their needs  MONITOR:   PO intake, Supplement acceptance, Weight trends  REASON FOR ASSESSMENT:   Malnutrition Screening Tool    ASSESSMENT:   41 year old man with history of bipolar 2 disorder and substance abuse who comes to the emergency room reporting suicidal thoughts   Pt eating 100% of meals today. RD suspects pt not eating well pta as pt has lost 26lbs(12%) in 4 months. This is likely r/t substance abuse and bipolar disorder. Pt with low potassium on admit; GI consult pending. RD will order supplements and MVI as pt is suspected to be malnourished.   Medications reviewed and include: lithium carbonate  Labs reviewed: K 3.7 wnl (was 3.0(L) on admit) Wbc- 11.7(H)  Diet Order:  Diet regular Room service appropriate? No; Fluid consistency: Thin  Skin:  Reviewed, no issues  Last BM:  PTA  Height:   Ht Readings from Last 1 Encounters:  05/15/17 6\' 1"  (1.854 m)    Weight:   Wt Readings from Last 1 Encounters:  05/15/17 168 lb (76.2 kg)    Ideal Body Weight:  83.6 kg  BMI:  Body mass index is 22.16 kg/m.  Estimated Nutritional Needs:   Kcal:  2300-2600kcal/day   Protein:  99-114  Fluid:  >2.3L/day   EDUCATION NEEDS:   Education needs no appropriate at this time  Betsey Holidayasey Ritaj Dullea MS, RD, LDN Pager #[phone]- [phone] After Hours Pager: [phone]

## 2017-05-17 ENCOUNTER — Encounter: Payer: Self-pay | Admitting: Psychiatry

## 2017-05-17 DIAGNOSIS — F1520 Other stimulant dependence, uncomplicated: Secondary | ICD-10-CM

## 2017-05-17 LAB — MISC LABCORP TEST (SEND OUT): Labcorp test code: 1453

## 2017-05-17 LAB — PROLACTIN: PROLACTIN: 9.8 ng/mL (ref 4.0–15.2)

## 2017-05-17 MED ORDER — QUETIAPINE FUMARATE 200 MG PO TABS
200.0000 mg | ORAL_TABLET | Freq: Every day | ORAL | Status: DC
  Administered 2017-05-17: 200 mg via ORAL
  Filled 2017-05-17: qty 1

## 2017-05-17 NOTE — Progress Notes (Signed)
Recreation Therapy Notes  Date: 08.13.18 Time: 9:30 am Location: Craft Room  Group Topic: Self-expression  Goal Area(s) Addresses:  Patient will identify one color per emotion listed on wheel. Patient will verbalize benefit of using art as a means of self-expression. Patient will verbalize one emotion experienced during session. Patient will be educated on other forms of self-expression.  Behavioral Response: Did not attend  Intervention: Emotion Wheel  Activity: Patients were given an Emotion Wheel worksheet and were instructed to pick a color for each emotion listed on the worksheet.  Education: LRT educated patients on other forms of self-expression.  Education Outcome: Patient did not attend group.  Clinical Observations/Feedback: Patient did not attend group.  Jacquelynn CreeGreene,Manuelita Moxon M, LRT/CTRS 05/17/2017 10:10 AM

## 2017-05-17 NOTE — Progress Notes (Signed)
Patient is alert and oriented to person, place and time. Skin is warm and dry with multiple abrasions noted. Patient denies SI at this time. Patient refused Lithium this morning stating, "This shit don't help me, I don't know why I'm taking it." Patient isolates to room, no peer interaction noted. Compliant with meals, minimal peer interaction noted in dayroom. Milieu remains therapeutic. Patient will be monitored and physician notified of any acute changes.

## 2017-05-17 NOTE — Progress Notes (Signed)
[facility] MD Progress Note  05/17/2017 1:03 PM [name]  MRN:  [id number] Subjective:  Patient was seen in his room this morning. He continues to lie down and not very forthcoming. States he is doing okay. Endorsing  okay sleep. Not willing to talk about his mood. Denies any current suicidal thoughts and is able to contract for safety. He however states that he does not want to take the lithium since it has not done anything for him and affects his liver. Tried to discuss with him that it affects his kidneys mainly. However patient not responding to that. Feels like that the Seroquel also does not help him but he is willing to take it. When cooperative on the unit and mostly staying to himself.  8/13 patient says that he feels very weak, lightheaded and achy all over. He feels part of it is withdrawal but he also thinks the Seroquel is making him feel lightheaded. He is open to the idea about residential treatment for substance abuse, looks like he has nowhere to go to as his family asked him to leave prior to admission. Patient tells me he is currently homeless and does not have any income. He has been in and out of jail for not paying child support.  Prior to admission the patient was binging on crystal meth, cocaine, cannabis and benzodiazepines.  Principal Problem: Bipolar 2 disorder, major depressive episode (HCC) Diagnosis:   Patient Active Problem List   Diagnosis Date Noted  . 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 Bipolar 2 disorder, major depressive episode (HCC) [F31.81] 05/15/2017  . Hepatitis C [B19.20] 05/14/2017  . Tobacco use disorder [F17.200] 10/16/2016  . Cannabis use disorder, moderate, dependence (HCC) [F12.20] 10/15/2016   Total Time spent with patient: 30 minutes  Past Psychiatric History: Patient has had admissions in the past for suicidal ideation and self injury with a diagnosis of bipolar disorder. Was last discharged on a combination of  Seroquel and lithium and Luvox. Has not been following up at all. Says that he has trouble with transportation. He also did not take the medications because he did not like the side effects.  Past Medical History:  Past Medical History:  Diagnosis Date  . Bipolar disorder (HCC)   . Depression   . Hepatitis C   . Sinus congestion     Past Surgical History:  Procedure Laterality Date  . ANKLE SURGERY    . HERNIA REPAIR    . HERNIA REPAIR    . TENDON REPAIR     Family History:  Family History  Problem Relation Age of Onset  . Heart failure Mother    Family Psychiatric  History: unknown  Social History:  History  Alcohol Use  . 7.2 oz/week  . 12 Cans of beer per week    Comment: reports weekend use (fri/sat night)     History  Drug Use  . Frequency: 3.0 times per week  . Types: Marijuana, Methamphetamines    Comment: Pt states that he last used meth yesterday morning     Social History   Social History  . Marital status: Single    Spouse name: N/A  . Number of children: N/A  . Years of education: N/A   Social History Main Topics  . Smoking status: Current Every Day Smoker    Packs/day: 1.00    Types: Cigarettes  . Smokeless tobacco: Never Used  . Alcohol use 7.2 oz/week    12 Cans of  beer per week     Comment: reports weekend use (fri/sat night)  . Drug use: Yes    Frequency: 3.0 times per week    Types: Marijuana, Methamphetamines     Comment: Pt states that he last used meth yesterday morning   . Sexual activity: Not Asked   Other Topics Concern  . None   Social History Narrative  . None   Additional Social History:    History of alcohol / drug use?: Yes Name of Substance 1: "Crystal Meth (Methamphines)" Name of Substance 2: Cocaine Name of Substance 3: Cannabis Name of Substance 4: "Pills (Benzo's)"       Current Medications: Current Facility-Administered Medications  Medication Dose Route Frequency Provider Last Rate Last Dose  .  acetaminophen (TYLENOL) tablet 650 mg  650 mg Oral Q6H PRN Clapacs, John T, MD      . alum & mag hydroxide-simeth (MAALOX/MYLANTA) 200-200-20 MG/5ML suspension 30 mL  30 mL Oral Q4H PRN Clapacs, John T, MD      . feeding supplement (ENSURE ENLIVE) (ENSURE ENLIVE) liquid 237 mL  237 mL Oral TID BM Jimmy Footman, MD   237 mL at 05/17/17 1040  . fluvoxaMINE (LUVOX) tablet 100 mg  100 mg Oral QHS Clapacs, John T, MD   100 mg at 05/16/17 2117  . magnesium hydroxide (MILK OF MAGNESIA) suspension 30 mL  30 mL Oral Daily PRN Clapacs, John T, MD      . multivitamin with minerals tablet 1 tablet  1 tablet Oral Daily Jimmy Footman, MD   1 tablet at 05/17/17 0830  . QUEtiapine (SEROQUEL) tablet 200 mg  200 mg Oral QHS Jimmy Footman, MD        Lab Results:  Results for orders placed or performed during the hospital encounter of 05/15/17 (from the past 48 hour(s))  Potassium     Status: None   Collection Time: 05/15/17  8:34 PM  Result Value Ref Range   Potassium 3.7 3.5 - 5.1 mmol/L  Miscellaneous [facility] test (send-out)     Status: None   Collection Time: 05/16/17  6:46 AM  Result Value Ref Range   [facility] test code 1,453    [facility] test name HEMOGLOBIN A1C    Misc [facility] result COMMENT     Comment: (NOTE) Test Ordered: 001453 Hemoglobin A1c Hemoglobin A1c                 5.7         [H ] %        BN     Reference Range: 4.8-5.6                                       Pre-diabetes: 5.7 - 6.4         Diabetes: >6.4         Glycemic control for adults with diabetes: <7.0 Performed At: [facility] [address] [place], [place] [id number] Mila Homer MD KG:[id number]   Lipid panel     Status: None   Collection Time: 05/16/17  6:48 AM  Result Value Ref Range   Cholesterol 122 0 - 200 mg/dL   Triglycerides 644 <034 mg/dL   HDL 45 >74 mg/dL   Total CHOL/HDL Ratio 2.7 RATIO   VLDL 20 0 - 40 mg/dL   LDL Cholesterol 57 0 - 99 mg/dL    Comment:  Total Cholesterol/HDL:CHD Risk Coronary Heart Disease Risk Table                     Men   Women  1/2 Average Risk   3.4   3.3  Average Risk       5.0   4.4  2 X Average Risk   9.6   7.1  3 X Average Risk  23.4   11.0        Use the calculated Patient Ratio above and the CHD Risk Table to determine the patient's CHD Risk.        ATP III CLASSIFICATION (LDL):  <100     mg/dL   Optimal  960-454  mg/dL   Near or Above                    Optimal  130-159  mg/dL   Borderline  098-119  mg/dL   High  >147     mg/dL   Very High   Prolactin     Status: None   Collection Time: 05/16/17  6:48 AM  Result Value Ref Range   Prolactin 9.8 4.0 - 15.2 ng/mL    Comment: (NOTE) Performed At: [facility] [address] [place], [place] [id number] Mila Homer MD QM:[id number]   TSH     Status: Abnormal   Collection Time: 05/16/17  6:48 AM  Result Value Ref Range   TSH 0.177 (L) 0.350 - 4.500 uIU/mL    Comment: Performed by a 3rd Generation assay with a functional sensitivity of <=0.01 uIU/mL.    Blood Alcohol level:  Lab Results  Component Value Date   ETH <5 05/14/2017   ETH <5 10/14/2016    Metabolic Disorder Labs: Lab Results  Component Value Date   HGBA1C 5.6 10/16/2016   MPG 114 10/16/2016   Lab Results  Component Value Date   PROLACTIN 9.8 05/16/2017   PROLACTIN 13.9 10/16/2016   Lab Results  Component Value Date   CHOL 122 05/16/2017   TRIG 101 05/16/2017   HDL 45 05/16/2017   CHOLHDL 2.7 05/16/2017   VLDL 20 05/16/2017   LDLCALC 57 05/16/2017   LDLCALC 90 10/16/2016    Physical Findings: AIMS: Facial and Oral Movements Muscles of Facial Expression: None, normal Lips and Perioral Area: None, normal Jaw: None, normal Tongue: None, normal,Extremity Movements Upper (arms, wrists, hands, fingers): None, normal Lower (legs, knees, ankles, toes): None, normal, Trunk Movements Neck, shoulders, hips: None, normal, Overall Severity Severity of  abnormal movements (highest score from questions above): None, normal Incapacitation due to abnormal movements: None, normal Patient's awareness of abnormal movements (rate only patient's report): No Awareness, Dental Status Current problems with teeth and/or dentures?: Yes Does patient usually wear dentures?: No  CIWA:  CIWA-Ar Total: 0 COWS:  COWS Total Score: 2  Musculoskeletal: Strength & Muscle Tone: within normal limits Gait & Station: normal Patient leans: N/A  Psychiatric Specialty Exam: Physical Exam  Constitutional: He is oriented to person, place, and time. He appears well-developed.  HENT:  Head: Normocephalic and atraumatic.  Eyes: Conjunctivae and EOM are normal.  Neck: Normal range of motion.  Respiratory: Effort normal.  Musculoskeletal: Normal range of motion.  Neurological: He is alert and oriented to person, place, and time.    Review of Systems  Constitutional: Positive for malaise/fatigue.  HENT: Negative.   Eyes: Negative.   Respiratory: Negative.   Cardiovascular: Negative.   Gastrointestinal: Negative.   Genitourinary: Negative.  Musculoskeletal: Positive for myalgias.  Skin: Negative.   Neurological: Positive for dizziness and weakness.  Psychiatric/Behavioral: Positive for depression and substance abuse.    Blood pressure 102/71, pulse 92, temperature 97.8 F (36.6 C), resp. rate 20, height 6\' 1"  (1.854 m), weight 76.2 kg (168 lb), SpO2 98 %.Body mass index is 22.16 kg/m.  General Appearance: Casual  Eye Contact:  Minimal  Speech:  Garbled  Volume:  Decreased  Mood:  Anxious, Depressed and Irritable  Affect:  Constricted  Thought Process:  Goal Directed  Orientation:  Full (Time, Place, and Person)  Thought Content:  Logical  Suicidal Thoughts:  Yes.  with intent/plan  Homicidal Thoughts:  No  Memory:  Immediate;   Fair Recent;   Fair Remote;   Fair  Judgement:  Impaired  Insight:  Shallow  Psychomotor Activity:  Decreased   Concentration:  Concentration: Fair and Attention Span: Fair  Recall:  FiservFair  Fund of Knowledge:  Fair  Language:  Fair  Akathisia:  No  Handed:  Right  AIMS (if indicated):     Assets:  Desire for Improvement  ADL's:  Intact  Cognition:  WNL  Sleep:  Number of Hours: 8.3     Treatment Plan Summary: Daily contact with patient to assess and evaluate symptoms and progress in treatment and Medication management   Patient is a 41 year old male with a long history of substance abuse and bipolar disorder. His drug screen was positive for amphetamines. Patient presenting with suicidal thoughts of having overdosed on several substances and being very hopeless.  Depressive symptoms in the setting of methamphetamine withdrawal: Prior diagnosis of bipolar however he also has a very long/severe history of addiction.  U tox + for amphetamines.  Consider treatment with antidepressant if symptoms persist.   Insomnia/poor impulse control: continue seroquel for now but will decrease to 200 mg as he c/o dizziness and weakness.  Substance abuse- continue to monitor and recommend residential treatment.--Needs residential treatment pt is interested  Labs- urine drug screen positive for amphetamines, Liver function tests show elevated AST and ALT, BAL is less than 5, WBC elevated Potassium level normal at 3.7  Not ready for discharge yet.    Jimmy FootmanHernandez-Gonzalez,  Carlito Bogert, MD 05/17/2017, 1:03 PM

## 2017-05-17 NOTE — [facility] Group Notes (Signed)
[facility] LCSW Group Therapy Note  Date/Time: 05/17/17, 1300  Type of Therapy and Topic:  Group Therapy:  Overcoming Obstacles  Participation Level:  Did not attend  Description of Group:    In this group patients will be encouraged to explore what they see as obstacles to their own wellness and recovery. They will be guided to discuss their thoughts, feelings, and behaviors related to these obstacles. The group will process together ways to cope with barriers, with attention given to specific choices patients can make. Each patient will be challenged to identify changes they are motivated to make in order to overcome their obstacles. This group will be process-oriented, with patients participating in exploration of their own experiences as well as giving and receiving support and challenge from other group members.  Therapeutic Goals: 1. Patient will identify personal and current obstacles as they relate to admission. 2. Patient will identify barriers that currently interfere with their wellness or overcoming obstacles.  3. Patient will identify feelings, thought process and behaviors related to these barriers. 4. Patient will identify two changes they are willing to make to overcome these obstacles:    Summary of Patient Progress      Therapeutic Modalities:   Cognitive Behavioral Therapy Solution Focused Therapy Motivational Interviewing Relapse Prevention Therapy  Greg Kohlton Gilpatrick, LCSW 

## 2017-05-17 NOTE — [facility] Suicide Risk Assessment (Signed)
[facility] INPATIENT:  Family/Significant Other Suicide Prevention Education  Suicide Prevention Education:  Contact Attempts: Arlana PouchDarlene Davie, mother, [phone],has been identified by the patient as the family member/significant other with whom the patient will be residing, and identified as the person(s) who will aid the patient in the event of a mental health crisis.  With written consent from the patient, two attempts were made to provide suicide prevention education, prior to and/or following the patient's discharge.  We were unsuccessful in providing suicide prevention education.  A suicide education pamphlet was given to the patient to share with family/significant other.  Date and time of first attempt:05/17/17, 1555 Date and time of second attempt:  Lorri FrederickWierda, Kwan Shellhammer Jon, LCSW 05/17/2017, 3:55 PM

## 2017-05-17 NOTE — Plan of Care (Signed)
Problem: Coping: Goal: Ability to cope will improve Outcome: Progressing Patient remains in the bed in his room most of the evening.  He came out for medications and to the dayroom for snack.  He continues to speak rapidly and tends to talk while walking away.  He is restless  while out of bed but appreciative of care.  He denies SI and contracts for safety.  Problem: Safety: Goal: Ability to disclose and discuss suicidal ideas will improve Outcome: Progressing Patient denies SI and contracts for safety.

## 2017-05-17 NOTE — Progress Notes (Signed)
CSW spoke with pt regarding SPE release.  Pt agreed to sign release for call to his mother.  CSW also tried to discuss residential substance use options.  Pt has been in bed all day and unwilling to engage much.  He did not want to come to treatment team early in the day.  Similar presentation now: pt did not want to talk about different program options, said he was too tired today to talk to anyone on the phone. Garner NashGregory Randee Upchurch, MSW, LCSW Clinical Social Worker 05/17/2017 2:40 PM

## 2017-05-17 NOTE — Plan of Care (Signed)
Problem: Activity: Goal: Interest or engagement in activities will improve Outcome: Not Progressing Patient does not engage in daily therapeutic group settings.  Problem: Education: Goal: Emotional status will improve Outcome: Not Progressing Patient affect remains flat, non-engaging.

## 2017-05-17 NOTE — Tx Team (Addendum)
Interdisciplinary Treatment and Diagnostic Plan Update  05/17/2017 Time of Session: [place] MRN: [id number]  Principal Diagnosis: Bipolar 2 disorder, major depressive episode ([place])  Secondary Diagnoses: Principal Problem:   Bipolar 2 disorder, major depressive episode ([place]) Active Problems:   Cannabis use disorder, moderate, dependence (HCC)   Tobacco use disorder   Hepatitis C   Methamphetamine use disorder, severe (HCC)   Current Medications:  Current Facility-Administered Medications  Medication Dose Route Frequency Provider Last Rate Last Dose  . acetaminophen (TYLENOL) tablet 650 mg  650 mg Oral Q6H PRN Clapacs, John T, MD      . alum & mag hydroxide-simeth (MAALOX/MYLANTA) 200-200-20 MG/5ML suspension 30 mL  30 mL Oral Q4H PRN Clapacs, John T, MD      . feeding supplement (ENSURE ENLIVE) (ENSURE ENLIVE) liquid 237 mL  237 mL Oral TID BM Hildred Priest, MD   237 mL at 05/17/17 1040  . fluvoxaMINE (LUVOX) tablet 100 mg  100 mg Oral QHS Clapacs, John T, MD   100 mg at 05/16/17 2117  . hydrOXYzine (ATARAX/VISTARIL) tablet 50 mg  50 mg Oral TID PRN Clapacs, John T, MD      . lithium carbonate capsule 300 mg  300 mg Oral BID WC Clapacs, John T, MD      . magnesium hydroxide (MILK OF MAGNESIA) suspension 30 mL  30 mL Oral Daily PRN Clapacs, John T, MD      . multivitamin with minerals tablet 1 tablet  1 tablet Oral Daily Hildred Priest, MD   1 tablet at 05/17/17 0830  . QUEtiapine (SEROQUEL) tablet 400 mg  400 mg Oral QHS Clapacs, Madie Reno, MD   400 mg at 05/16/17 2117   PTA Medications: No prescriptions prior to admission.    Patient Stressors: Financial difficulties Substance abuse  Patient Strengths: Agricultural engineer for treatment/growth  Treatment Modalities: Medication Management, Group therapy, Case management,  1 to 1 session with clinician, Psychoeducation, Recreational therapy.   Physician Treatment Plan for Primary  Diagnosis: Bipolar 2 disorder, major depressive episode ([place]) Long Term Goal(s): Improvement in symptoms so as ready for discharge Improvement in symptoms so as ready for discharge   Short Term Goals: Ability to identify changes in lifestyle to reduce recurrence of condition will improve Ability to verbalize feelings will improve Ability to disclose and discuss suicidal ideas Ability to demonstrate self-control will improve Ability to identify and develop effective coping behaviors will improve Ability to maintain clinical measurements within normal limits will improve Compliance with prescribed medications will improve Ability to identify triggers associated with substance abuse/mental health issues will improve Ability to identify changes in lifestyle to reduce recurrence of condition will improve Ability to verbalize feelings will improve Ability to disclose and discuss suicidal ideas Ability to demonstrate self-control will improve Ability to identify and develop effective coping behaviors will improve Ability to maintain clinical measurements within normal limits will improve Compliance with prescribed medications will improve Ability to identify triggers associated with substance abuse/mental health issues will improve  Medication Management: Evaluate patient's response, side effects, and tolerance of medication regimen.  Therapeutic Interventions: 1 to 1 sessions, Unit Group sessions and Medication administration.  Evaluation of Outcomes: Not Met  Physician Treatment Plan for Secondary Diagnosis: Principal Problem:   Bipolar 2 disorder, major depressive episode ([place]) Active Problems:   Cannabis use disorder, moderate, dependence (HCC)   Tobacco use disorder   Hepatitis C   Methamphetamine use disorder, severe ([place])  Long Term Goal(s): Improvement  in symptoms so as ready for discharge Improvement in symptoms so as ready for discharge   Short Term Goals: Ability to identify  changes in lifestyle to reduce recurrence of condition will improve Ability to verbalize feelings will improve Ability to disclose and discuss suicidal ideas Ability to demonstrate self-control will improve Ability to identify and develop effective coping behaviors will improve Ability to maintain clinical measurements within normal limits will improve Compliance with prescribed medications will improve Ability to identify triggers associated with substance abuse/mental health issues will improve Ability to identify changes in lifestyle to reduce recurrence of condition will improve Ability to verbalize feelings will improve Ability to disclose and discuss suicidal ideas Ability to demonstrate self-control will improve Ability to identify and develop effective coping behaviors will improve Ability to maintain clinical measurements within normal limits will improve Compliance with prescribed medications will improve Ability to identify triggers associated with substance abuse/mental health issues will improve     Medication Management: Evaluate patient's response, side effects, and tolerance of medication regimen.  Therapeutic Interventions: 1 to 1 sessions, Unit Group sessions and Medication administration.  Evaluation of Outcomes: Progressing   RN Treatment Plan for Primary Diagnosis: Bipolar 2 disorder, major depressive episode ([place]) Long Term Goal(s): Knowledge of disease and therapeutic regimen to maintain health will improve  Short Term Goals: Ability to verbalize feelings will improve, Ability to disclose and discuss suicidal ideas, Ability to identify and develop effective coping behaviors will improve and Compliance with prescribed medications will improve  Medication Management: RN will administer medications as ordered by provider, will assess and evaluate patient's response and provide education to patient for prescribed medication. RN will report any adverse and/or side  effects to prescribing provider.  Therapeutic Interventions: 1 on 1 counseling sessions, Psychoeducation, Medication administration, Evaluate responses to treatment, Monitor vital signs and CBGs as ordered, Perform/monitor CIWA, COWS, AIMS and Fall Risk screenings as ordered, Perform wound care treatments as ordered.  Evaluation of Outcomes: Progressing   LCSW Treatment Plan for Primary Diagnosis: Bipolar 2 disorder, major depressive episode ([place]) Long Term Goal(s): Safe transition to appropriate next level of care at discharge, Engage patient in therapeutic group addressing interpersonal concerns.  Short Term Goals: Engage patient in aftercare planning with referrals and resources, Facilitate patient progression through stages of change regarding substance use diagnoses and concerns and Increase skills for wellness and recovery  Therapeutic Interventions: Assess for all discharge needs, 1 to 1 time with Social worker, Explore available resources and support systems, Assess for adequacy in community support network, Educate family and significant other(s) on suicide prevention, Complete Psychosocial Assessment, Interpersonal group therapy.  Evaluation of Outcomes: Progressing   Progress in Treatment: Attending groups: No. Participating in groups: No. Taking medication as prescribed: Yes. Toleration medication: Yes. Family/Significant other contact made: No, will contact:  when given permission Patient understands diagnosis: Yes. Discussing patient identified problems/goals with staff: Yes. Medical problems stabilized or resolved: Yes. Denies suicidal/homicidal ideation: Yes. Issues/concerns per patient self-inventory: No. Other: none  New problem(s) identified: No, Describe:  none  New Short Term/Long Term Goal(s): Pt goal: "Get right with my medication, get help with my mental problems."  Discharge Plan or Barriers: CSW assessing for appropriate plan.  Reason for Continuation of  Hospitalization: Depression Medication stabilization  Estimated Length of Stay: 3-5 days  Attendees: Patient:[name] 05/17/2017 11:38 AM  Physician: Dr. Jerilee Hoh, MD 05/17/2017 11:38 AM  Nursing:  05/17/2017 11:38 AM  RN Care Manager: 05/17/2017 11:38 AM  Social Worker: Lurline Idol,  LCSW 05/17/2017 11:38 AM  Recreational Therapist: Everitt Amber, LRT/CTRS  05/17/2017 11:38 AM  Other:  05/17/2017 11:38 AM  Other:  05/17/2017 11:38 AM  Other: 05/17/2017 11:38 AM    Scribe for Treatment Team: Joanne Chars, LCSW 05/17/2017 11:38 AM

## 2017-05-18 MED ORDER — TRAZODONE HCL 100 MG PO TABS
100.0000 mg | ORAL_TABLET | Freq: Every day | ORAL | Status: DC
  Administered 2017-05-18: 100 mg via ORAL
  Filled 2017-05-18 (×2): qty 1

## 2017-05-18 NOTE — Social Work (Signed)
CSW contacted [facility]'s admissions coordinator, Drema PryShayla to inquire about male bed a [facility]. Shayla stated no male beds at this time. Will follow-up with other residential facilities.  Hampton AbbotKadijah Maxemiliano Riel, MSW, LCSW-A 05/18/2017, 3:21PM

## 2017-05-18 NOTE — Progress Notes (Signed)
Recreation Therapy Notes  Date: 08.14.18 Time: 1:00 pm Location: Craft Room  Group Topic: Self-expression  Goal Area(s) Addresses:  Patient will be able to identify a color that represents each emotion. Patient will verbalize benefit of using art as a means of self-expression. Patient will verbalize one emotion experienced while participating in activity.  Behavioral Response: Did not attend  Intervention: The Colors Within Me  Activity: Patients were given a blank face worksheet and were instructed to pick a color for each emotion they were feeling and show on the worksheet how much of that emotion they were feeling.  Education: LRT educated patients on other forms of self-expression.  Education Outcome: Patient did not attend group.  Clinical Observations/Feedback: Patient did not attend group.  Jacquelynn CreeGreene,Danny Yackley M, LRT/CTRS 05/18/2017 3:54 PM

## 2017-05-18 NOTE — [facility] Suicide Risk Assessment (Signed)
[facility] INPATIENT:  Family/Significant Other Suicide Prevention Education  Suicide Prevention Education:  Education Completed; Arlana PouchDarlene Davie, mother, [phone]  has been identified by the patient as the family member/significant other with whom the patient will be residing, and identified as the person(s) who will aid the patient in the event of a mental health crisis (suicidal ideations/suicide attempt).  With written consent from the patient, the family member/significant other has been provided the following suicide prevention education, prior to the and/or following the discharge of the patient.  The suicide prevention education provided includes the following:  Suicide risk factors  Suicide prevention and interventions  National Suicide Hotline telephone number  [facility] assessment telephone number  [facility] Assistance 911  [facility] and/or Residential [facility] telephone number  Request made of family/significant other to:  Remove weapons (e.g., guns, rifles, knives), all items previously/currently identified as safety concern.    Remove drugs/medications (over-the-counter, prescriptions, illicit drugs), all items previously/currently identified as a safety concern.  The family member/significant other verbalizes understanding of the suicide prevention education information provided.  The family member/significant other agrees to remove the items of safety concern listed above.  Lynden OxfordKadijah R Mirenda Baltazar, MSW, LCSW-A 05/18/2017, 4:03 PM

## 2017-05-18 NOTE — Progress Notes (Signed)
Recreation Therapy Notes  At approximately 2:45 pm on 05/17/17, LRT attempted assessment. Patient refused stating he did not feel good.  At approximately 11:15 am, LRT attempted assessment. Patient refused stating he did not feel well.  Jacquelynn CreeGreene,[name] M, LRT/CTRS 05/18/2017 1:29 PM

## 2017-05-18 NOTE — [facility] Group Notes (Signed)
[facility] LCSW Group Therapy Note  Date/Time: 05/18/17, 0930  Type of Therapy/Topic:  Group Therapy:  Feelings about Diagnosis  Participation Level:  Did Not Attend   Mood:   Description of Group:    This group will allow patients to explore their thoughts and feelings about diagnoses they have received. Patients will be guided to explore their level of understanding and acceptance of these diagnoses. Facilitator will encourage patients to process their thoughts and feelings about the reactions of others to their diagnosis, and will guide patients in identifying ways to discuss their diagnosis with significant others in their lives. This group will be process-oriented, with patients participating in exploration of their own experiences as well as giving and receiving support and challenge from other group members.   Therapeutic Goals: 1. Patient will demonstrate understanding of diagnosis as evidence by identifying two or more symptoms of the disorder:  2. Patient will be able to express two feelings regarding the diagnosis 3. Patient will demonstrate ability to communicate their needs through discussion and/or role plays  Summary of Patient Progress:        Therapeutic Modalities:   Cognitive Behavioral Therapy Brief Therapy Feelings Identification   Daleen SquibbGreg Weber Monnier, LCSW

## 2017-05-18 NOTE — Progress Notes (Signed)
Patient is alert and oriented to person, place and time. Skin is warm, dry and intact. No limitations to all four extremities noted. Patient currently denies SI/HI, AVH. Patient was observed ambulating in hall during the shift with a steady gait. Patient observed mostly in bed resting with eyes closed, states, "I just feel so tired, I can't wait to get my energy back." Attends meals with selective  peer interaction noted. Milieu remains therapeutic. Patient will be monitored and physician notified of any acute changes.

## 2017-05-18 NOTE — Plan of Care (Signed)
Problem: Activity: Goal: Interest or engagement in activities will improve Outcome: Not Progressing Patient does not engage in any activities, remains isolative to room.  Problem: Education: Goal: Emotional status will improve Outcome: Not Progressing Patient's affect remains flat and guarded.

## 2017-05-18 NOTE — Progress Notes (Signed)
[name] was visible in the milieu at the beginning of this shift. Was on the phone, pleasantly speaking. Alert and oriented and denying thoughts of self harm. Denying hallucinations. Currently in bed awake. Did not attend wrap up group. Patient has no concern. Was encouraged to talk to staff as needed. Safety precautions maintained.

## 2017-05-18 NOTE — Progress Notes (Signed)
[facility] MD Progress Note  05/18/2017 12:25 PM [name]  MRN:  [id number] Subjective:  Patient was seen in his room this morning. He continues to lie down and not very forthcoming. States he is doing okay. Endorsing  okay sleep. Not willing to talk about his mood. Denies any current suicidal thoughts and is able to contract for safety. He however states that he does not want to take the lithium since it has not done anything for him and affects his liver. Tried to discuss with him that it affects his kidneys mainly. However patient not responding to that. Feels like that the Seroquel also does not help him but he is willing to take it. When cooperative on the unit and mostly staying to himself.  8/13 patient says that he feels very weak, lightheaded and achy all over. He feels part of it is withdrawal but he also thinks the Seroquel is making him feel lightheaded. He is open to the idea about residential treatment for substance abuse, looks like he has nowhere to go to as his family asked him to leave prior to admission. Patient tells me he is currently homeless and does not have any income. He has been in and out of jail for not paying child support.  Prior to admission the patient was binging on crystal meth, cocaine, cannabis and benzodiazepines.  8/14 Pt denies SI, HI or hallucinations. Still feels very depressed.  Open to residential treatment for substance abuse.  He has no place to go.  He did not f/u with psychiatry after his last d/c from our unit due to lack of transportation.   He has been in bed since admission.  Only gets up for meals.  Says he feels wiped out, very weak and tired.    Per nursing: Patient is alert and oriented to person, place and time. Skin is warm, dry and intact. No limitations to all four extremities noted. Patient currently denies SI/HI, AVH. Patient was observed ambulating in hall during the shift with a steady gait. Patient observed mostly in bed resting with  eyes closed, states, "I just feel so tired, I can't wait to get my energy back." Attends meals with selective  peer interaction noted. Milieu remains therapeutic. Patient will be monitored and physician notified of any acute changes.  Principal Problem: Bipolar 2 disorder, major depressive episode (HCC) Diagnosis:   Patient Active Problem List   Diagnosis Date Noted  . 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 Bipolar 2 disorder, major depressive episode (HCC) [F31.81] 05/15/2017  . Hepatitis C [B19.20] 05/14/2017  . Tobacco use disorder [F17.200] 10/16/2016  . Cannabis use disorder, moderate, dependence (HCC) [F12.20] 10/15/2016   Total Time spent with patient: 30 minutes  Past Psychiatric History: Patient has had admissions in the past for suicidal ideation and self injury with a diagnosis of bipolar disorder. Was last discharged on a combination of Seroquel and lithium and Luvox. Has not been following up at all. Says that he has trouble with transportation. He also did not take the medications because he did not like the side effects.  Past Medical History:  Past Medical History:  Diagnosis Date  . Bipolar disorder (HCC)   . Depression   . Hepatitis C   . Sinus congestion     Past Surgical History:  Procedure Laterality Date  . ANKLE SURGERY    . HERNIA REPAIR    . HERNIA REPAIR    . TENDON REPAIR  Family History:  Family History  Problem Relation Age of Onset  . Heart failure Mother    Family Psychiatric  History: unknown  Social History:  History  Alcohol Use  . 7.2 oz/week  . 12 Cans of beer per week    Comment: reports weekend use (fri/sat night)     History  Drug Use  . Frequency: 3.0 times per week  . Types: Marijuana, Methamphetamines    Comment: Pt states that he last used meth yesterday morning     Social History   Social History  . Marital status: Single    Spouse name: N/A  . Number of children: N/A  . Years of  education: N/A   Social History Main Topics  . Smoking status: Current Every Day Smoker    Packs/day: 1.00    Types: Cigarettes  . Smokeless tobacco: Never Used  . Alcohol use 7.2 oz/week    12 Cans of beer per week     Comment: reports weekend use (fri/sat night)  . Drug use: Yes    Frequency: 3.0 times per week    Types: Marijuana, Methamphetamines     Comment: Pt states that he last used meth yesterday morning   . Sexual activity: Not Asked   Other Topics Concern  . None   Social History Narrative  . None   Additional Social History:    History of alcohol / drug use?: Yes Name of Substance 1: "Crystal Meth (Methamphines)" Name of Substance 2: Cocaine Name of Substance 3: Cannabis Name of Substance 4: "Pills (Benzo's)"       Current Medications: Current Facility-Administered Medications  Medication Dose Route Frequency Provider Last Rate Last Dose  . acetaminophen (TYLENOL) tablet 650 mg  650 mg Oral Q6H PRN Clapacs, John T, MD      . alum & mag hydroxide-simeth (MAALOX/MYLANTA) 200-200-20 MG/5ML suspension 30 mL  30 mL Oral Q4H PRN Clapacs, John T, MD      . feeding supplement (ENSURE ENLIVE) (ENSURE ENLIVE) liquid 237 mL  237 mL Oral TID BM Jimmy FootmanHernandez-Gonzalez, Judithe Keetch, MD   237 mL at 05/18/17 [id number]  . magnesium hydroxide (MILK OF MAGNESIA) suspension 30 mL  30 mL Oral Daily PRN Clapacs, John T, MD      . multivitamin with minerals tablet 1 tablet  1 tablet Oral Daily Jimmy FootmanHernandez-Gonzalez, Anaalicia Reimann, MD   1 tablet at 05/18/17 0820  . traZODone (DESYREL) tablet 100 mg  100 mg Oral QHS Jimmy FootmanHernandez-Gonzalez, Kareli Hossain, MD        Lab Results:  No results found for this or any previous visit (from the past 48 hour(s)).  Blood Alcohol level:  Lab Results  Component Value Date   ETH <5 05/14/2017   ETH <5 10/14/2016    Metabolic Disorder Labs: Lab Results  Component Value Date   HGBA1C 5.6 10/16/2016   MPG 114 10/16/2016   Lab Results  Component Value Date   PROLACTIN  9.8 05/16/2017   PROLACTIN 13.9 10/16/2016   Lab Results  Component Value Date   CHOL 122 05/16/2017   TRIG 101 05/16/2017   HDL 45 05/16/2017   CHOLHDL 2.7 05/16/2017   VLDL 20 05/16/2017   LDLCALC 57 05/16/2017   LDLCALC 90 10/16/2016    Physical Findings: AIMS: Facial and Oral Movements Muscles of Facial Expression: None, normal Lips and Perioral Area: None, normal Jaw: None, normal Tongue: None, normal,Extremity Movements Upper (arms, wrists, hands, fingers): None, normal Lower (legs, knees, ankles, toes): None, normal, Trunk Movements  Neck, shoulders, hips: None, normal, Overall Severity Severity of abnormal movements (highest score from questions above): None, normal Incapacitation due to abnormal movements: None, normal Patient's awareness of abnormal movements (rate only patient's report): No Awareness, Dental Status Current problems with teeth and/or dentures?: Yes Does patient usually wear dentures?: No  CIWA:  CIWA-Ar Total: 0 COWS:  COWS Total Score: 2  Musculoskeletal: Strength & Muscle Tone: within normal limits Gait & Station: normal Patient leans: N/A  Psychiatric Specialty Exam: Physical Exam  Constitutional: He is oriented to person, place, and time. He appears well-developed.  HENT:  Head: Normocephalic and atraumatic.  Eyes: Conjunctivae and EOM are normal.  Neck: Normal range of motion.  Respiratory: Effort normal.  Musculoskeletal: Normal range of motion.  Neurological: He is alert and oriented to person, place, and time.    Review of Systems  Constitutional: Positive for malaise/fatigue.  HENT: Negative.   Eyes: Negative.   Respiratory: Negative.   Cardiovascular: Negative.   Gastrointestinal: Negative.   Genitourinary: Negative.   Musculoskeletal: Positive for myalgias.  Skin: Negative.   Neurological: Positive for weakness. Negative for dizziness.  Psychiatric/Behavioral: Positive for depression and substance abuse. Negative for  hallucinations, memory loss and suicidal ideas. The patient is not nervous/anxious and does not have insomnia.     Blood pressure 111/75, pulse (!) 107, temperature 97.9 F (36.6 C), temperature source Oral, resp. rate 20, height 6\' 1"  (1.854 m), weight 76.2 kg (168 lb), SpO2 98 %.Body mass index is 22.16 kg/m.  General Appearance: Casual  Eye Contact:  Minimal  Speech:  Garbled  Volume:  Decreased  Mood:  Anxious, Depressed and Irritable  Affect:  Constricted  Thought Process:  Goal Directed  Orientation:  Full (Time, Place, and Person)  Thought Content:  Logical  Suicidal Thoughts:  No  Homicidal Thoughts:  No  Memory:  Immediate;   Fair Recent;   Fair Remote;   Fair  Judgement:  Impaired  Insight:  Shallow  Psychomotor Activity:  Decreased  Concentration:  Concentration: Fair and Attention Span: Fair  Recall:  [employer] of Knowledge:  Fair  Language:  Fair  Akathisia:  No  Handed:  Right  AIMS (if indicated):     Assets:  Desire for Improvement  ADL's:  Intact  Cognition:  WNL  Sleep:  Number of Hours: 7.75     Treatment Plan Summary: Daily contact with patient to assess and evaluate symptoms and progress in treatment and Medication management   Patient is a 41 year old male with a long history of substance abuse and bipolar disorder. His drug screen was positive for amphetamines. Patient presenting with suicidal thoughts of having overdosed on several substances and being very hopeless.  Depressive symptoms in the setting of methamphetamine withdrawal: Prior diagnosis of bipolar however he also has a very long/severe history of addiction.  U tox + for amphetamines.  Consider treatment with antidepressant if symptoms persist.   Insomnia/poor impulse control: Patient feels that Seroquel makes him very tired and weak. I had decreased the dose from 400 to 200 mg last night the patient still feels the medication is causing sedation. I will go ahead and discontinue  Seroquel and instead I will place an order for trazodone 100 mg daily at bedtime  Substance abuse- continue to monitor and recommend residential treatment.--Needs residential treatment pt is interested  Labs- urine drug screen positive for amphetamines, Liver function tests show elevated AST and ALT, BAL is less than 5, WBC elevated Potassium level normal  at 3.7  Not ready for discharge yet. SW looking for residential treatment.   Jimmy Footman, MD 05/18/2017, 12:25 PM

## 2017-05-18 NOTE — [facility] Suicide Risk Assessment (Signed)
[facility] INPATIENT:  Family/Significant Other Suicide Prevention Education  Suicide Prevention Education:  Contact Attempts: Arlana PouchDarlene Davie, mother, [phone] has been identified by the patient as the family member/significant other with whom the patient will be residing, and identified as the person(s) who will aid the patient in the event of a mental health crisis.  With written consent from the patient, two attempts were made to provide suicide prevention education, prior to and/or following the patient's discharge.  We were unsuccessful in providing suicide prevention education.  A suicide education pamphlet was given to the patient to share with family/significant other.  Date and time of second attempt: 05/18/2017 @ 11:35AM  Lynden OxfordKadijah R Annalia Metzger, MSW, LCSW-A 05/18/2017, 11:36 AM

## 2017-05-19 MED ORDER — IBUPROFEN 200 MG PO TABS
400.0000 mg | ORAL_TABLET | Freq: Once | ORAL | Status: AC
  Administered 2017-05-19: 400 mg via ORAL
  Filled 2017-05-19: qty 2

## 2017-05-19 MED ORDER — IBUPROFEN 800 MG PO TABS
800.0000 mg | ORAL_TABLET | Freq: Three times a day (TID) | ORAL | Status: DC
  Administered 2017-05-19 – 2017-05-20 (×3): 800 mg via ORAL
  Filled 2017-05-19 (×3): qty 1

## 2017-05-19 MED ORDER — BENZOCAINE 10 % MT GEL
Freq: Four times a day (QID) | OROMUCOSAL | Status: DC | PRN
  Administered 2017-05-19: 12:00:00 via OROMUCOSAL
  Filled 2017-05-19: qty 9

## 2017-05-19 MED ORDER — IBUPROFEN 200 MG PO TABS
400.0000 mg | ORAL_TABLET | Freq: Four times a day (QID) | ORAL | Status: DC | PRN
  Administered 2017-05-19: 400 mg via ORAL
  Filled 2017-05-19: qty 2

## 2017-05-19 NOTE — Plan of Care (Signed)
Problem: Coping: Goal: Ability to verbalize feelings will improve Outcome: Progressing Was able to discuss his feelings, his mood

## 2017-05-19 NOTE — Progress Notes (Signed)
Patient is alert and oriented to person, place and time. Skin is warm, dry and intact. No limitations to all four extremities noted. Patient currently denies SI but complains of still feeling tired. Patient remains isolative to room resting with eyes closed.  Patient was observed ambulating in hall during the shift with a steady gait. Attends meals with peer minimal interaction noted. Milieu remains therapeutic. Patient will be monitored and physician notified of any acute changes.

## 2017-05-19 NOTE — Progress Notes (Signed)
Patient did not attend wrap up group. Stayed in room but said he was fine. Currently sleeping. No sign of discomfort. Safety precautions maintained.

## 2017-05-19 NOTE — Progress Notes (Signed)
Recreation Therapy Notes  Date: 08.15.18 Time: 1:00 pm Location: Craft Room  Group Topic: Self-esteem  Goal Area(s) Addresses:  Patient will be able to identify benefit of self-esteem. Patient will be able to identify ways to increase self-esteem.  Behavioral Response: Did not attend  Intervention: Self Portrait  Activity: Patients were given blank face worksheets and were instructed to draw their self-portrait of how they felt. Patients were given construction paper and were instructed to write a positive trait about themselves and positive traits about peers. After patients read positive comments, patients were given blank face worksheets and were instructed to draw their self-portraits of how they felt.  Education: LRT educated patients on ways they can increase their self-esteem.  Education Outcome: Patient did not attend group.  Clinical Observations/Feedback: Patient did not attend group.  Jacquelynn CreeGreene,Yexalen Deike M, LRT/CTRS 05/19/2017 1:46 PM

## 2017-05-19 NOTE — Plan of Care (Signed)
Problem: Education: Goal: Utilization of techniques to improve thought processes will improve Outcome: Progressing Pt expressing his needs and concerns appropriately

## 2017-05-19 NOTE — Plan of Care (Signed)
Problem: Education: Goal: Emotional status will improve Outcome: Not Progressing Remains restless and depressed. Denies SI

## 2017-05-19 NOTE — Plan of Care (Signed)
Problem: Activity: Goal: Interest or engagement in activities will improve Outcome: Progressing Participated in evening activities

## 2017-05-19 NOTE — Progress Notes (Signed)
Recreation Therapy Notes  At approximately 2:08 pm, LRT attempted assessment. Patient refused. Due to patient refusing assessment 3 consecutive times, LRT will not continue to attempt assessment.  Jacquelynn CreeGreene,Taegan Haider M, LRT/CTRS 05/19/2017 3:56 PM

## 2017-05-19 NOTE — [facility] Group Notes (Signed)
[facility] Group Notes:  (Nursing/MHT/Case Management/Adjunct)  Date:  05/19/2017  Time:  10:22 PM  Type of Therapy:  Group Therapy  Participation Level:  Did Not Attend  Participation Q Summary of Progress/Problems:  [name] 05/19/2017, 10:22 PM

## 2017-05-19 NOTE — Plan of Care (Signed)
Problem: Safety: Goal: Ability to remain free from injury will improve Outcome: Progressing Remains free from injuries. Safety precautions maintained

## 2017-05-19 NOTE — Plan of Care (Signed)
Problem: Coping: Goal: Ability to cope will improve Outcome: Progressing Although remains isolative to room throughout the shift, mood and affect is improving. Goal: Ability to verbalize feelings will improve Outcome: Progressing Patient able to verbalize feelings appropriately to staff.  Problem: Health Behavior/Discharge Planning: Goal: Compliance with therapeutic regimen will improve Outcome: Not Progressing Patient refused to take vital signs x 2 this morning.  Problem: Safety: Goal: Ability to remain free from injury will improve Outcome: Progressing Patient remains free form injury at this time, handwashing encouraged.  Problem: Activity: Goal: Interest or engagement in activities will improve Outcome: Not Progressing Remains isolative in room sleeping, does not engage in daily activities.  Problem: Education: Goal: Verbalization of understanding the information provided will improve Outcome: Progressing Patient verbalizes understanding the information that is provided to him regarding his plan of care.

## 2017-05-19 NOTE — Progress Notes (Signed)
[facility] MD Progress Note  05/19/2017 9:53 AM [name]  MRN:  [id number] Subjective:  Patient was seen in his room this morning. He continues to lie down and not very forthcoming. States he is doing okay. Endorsing  okay sleep. Not willing to talk about his mood. Denies any current suicidal thoughts and is able to contract for safety. He however states that he does not want to take the lithium since it has not done anything for him and affects his liver. Tried to discuss with him that it affects his kidneys mainly. However patient not responding to that. Feels like that the Seroquel also does not help him but he is willing to take it. When cooperative on the unit and mostly staying to himself.  8/13 patient says that he feels very weak, lightheaded and achy all over. He feels part of it is withdrawal but he also thinks the Seroquel is making him feel lightheaded. He is open to the idea about residential treatment for substance abuse, looks like he has nowhere to go to as his family asked him to leave prior to admission. Patient tells me he is currently homeless and does not have any income. He has been in and out of jail for not paying child support.  Prior to admission the patient was binging on crystal meth, cocaine, cannabis and benzodiazepines.  8/14 Pt denies SI, HI or hallucinations. Still feels very depressed.  Open to residential treatment for substance abuse.  He has no place to go.  He did not f/u with psychiatry after his last d/c from our unit due to lack of transportation.   He has been in bed since admission.  Only gets up for meals.  Says he feels wiped out, very weak and tired.   8/15 Pt continues to be in bed for most of the day.  Not really engaging in treatment.  Minimal group participation. Staff feels he is not really motivated for substance abuse treatment.  Pt informed no beds at [facility] or [facility].  No interested on religious based programs due to their strict rules.   Denies  SI or hallucinations.  Says he has a severe tooth ache.  C/o feeling sore, tired, drained. All meds have been d/c. Only on trazodone qhs   Per nursing: [name] was visible in the milieu at the beginning of this shift. Was on the phone, pleasantly speaking. Alert and oriented and denying thoughts of self harm. Denying hallucinations. Currently in bed awake. Did not attend wrap up group. Patient has no concern. Was encouraged to talk to staff as needed. Safety precautions maintained.   Principal Problem: Bipolar 2 disorder, major depressive episode (HCC) Diagnosis:   Patient Active Problem List   Diagnosis Date Noted  . 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 Bipolar 2 disorder, major depressive episode (HCC) [F31.81] 05/15/2017  . Hepatitis C [B19.20] 05/14/2017  . Tobacco use disorder [F17.200] 10/16/2016  . Cannabis use disorder, moderate, dependence (HCC) [F12.20] 10/15/2016   Total Time spent with patient: 30 minutes  Past Psychiatric History: Patient has had admissions in the past for suicidal ideation and self injury with a diagnosis of bipolar disorder. Was last discharged on a combination of Seroquel and lithium and Luvox. Has not been following up at all. Says that he has trouble with transportation. He also did not take the medications because he did not like the side effects.  Past Medical History:  Past Medical History:  Diagnosis Date  .  Bipolar disorder (HCC)   . Depression   . Hepatitis C   . Sinus congestion     Past Surgical History:  Procedure Laterality Date  . ANKLE SURGERY    . HERNIA REPAIR    . HERNIA REPAIR    . TENDON REPAIR     Family History:  Family History  Problem Relation Age of Onset  . Heart failure Mother    Family Psychiatric  History: unknown  Social History:  History  Alcohol Use  . 7.2 oz/week  . 12 Cans of beer per week    Comment: reports weekend use (fri/sat night)     History  Drug Use  . Frequency:  3.0 times per week  . Types: Marijuana, Methamphetamines    Comment: Pt states that he last used meth yesterday morning     Social History   Social History  . Marital status: Single    Spouse name: N/A  . Number of children: N/A  . Years of education: N/A   Social History Main Topics  . Smoking status: Current Every Day Smoker    Packs/day: 1.00    Types: Cigarettes  . Smokeless tobacco: Never Used  . Alcohol use 7.2 oz/week    12 Cans of beer per week     Comment: reports weekend use (fri/sat night)  . Drug use: Yes    Frequency: 3.0 times per week    Types: Marijuana, Methamphetamines     Comment: Pt states that he last used meth yesterday morning   . Sexual activity: Not Asked   Other Topics Concern  . None   Social History Narrative  . None   Additional Social History:    History of alcohol / drug use?: Yes Name of Substance 1: "Crystal Meth (Methamphines)" Name of Substance 2: Cocaine Name of Substance 3: Cannabis Name of Substance 4: "Pills (Benzo's)"       Current Medications: Current Facility-Administered Medications  Medication Dose Route Frequency Provider Last Rate Last Dose  . acetaminophen (TYLENOL) tablet 650 mg  650 mg Oral Q6H PRN Clapacs, Jackquline Denmark, MD   650 mg at 05/18/17 2116  . alum & mag hydroxide-simeth (MAALOX/MYLANTA) 200-200-20 MG/5ML suspension 30 mL  30 mL Oral Q4H PRN Clapacs, John T, MD      . feeding supplement (ENSURE ENLIVE) (ENSURE ENLIVE) liquid 237 mL  237 mL Oral TID BM Jimmy Footman, MD   237 mL at 05/18/17 1954  . magnesium hydroxide (MILK OF MAGNESIA) suspension 30 mL  30 mL Oral Daily PRN Clapacs, John T, MD      . multivitamin with minerals tablet 1 tablet  1 tablet Oral Daily Jimmy Footman, MD   1 tablet at 05/19/17 [phone]  . traZODone (DESYREL) tablet 100 mg  100 mg Oral QHS Jimmy Footman, MD   100 mg at 05/18/17 2116    Lab Results:  No results found for this or any previous visit  (from the past 48 hour(s)).  Blood Alcohol level:  Lab Results  Component Value Date   [facility] <5 05/14/2017   ETH <5 10/14/2016    Metabolic Disorder Labs: Lab Results  Component Value Date   HGBA1C 5.6 10/16/2016   MPG 114 10/16/2016   Lab Results  Component Value Date   PROLACTIN 9.8 05/16/2017   PROLACTIN 13.9 10/16/2016   Lab Results  Component Value Date   CHOL 122 05/16/2017   TRIG 101 05/16/2017   HDL 45 05/16/2017   CHOLHDL 2.7 05/16/2017  VLDL 20 05/16/2017   LDLCALC 57 05/16/2017   LDLCALC 90 10/16/2016    Physical Findings: AIMS: Facial and Oral Movements Muscles of Facial Expression: None, normal Lips and Perioral Area: None, normal Jaw: None, normal Tongue: None, normal,Extremity Movements Upper (arms, wrists, hands, fingers): None, normal Lower (legs, knees, ankles, toes): None, normal, Trunk Movements Neck, shoulders, hips: None, normal, Overall Severity Severity of abnormal movements (highest score from questions above): None, normal Incapacitation due to abnormal movements: None, normal Patient's awareness of abnormal movements (rate only patient's report): No Awareness, Dental Status Current problems with teeth and/or dentures?: Yes Does patient usually wear dentures?: No  CIWA:  CIWA-Ar Total: 0 COWS:  COWS Total Score: 2  Musculoskeletal: Strength & Muscle Tone: within normal limits Gait & Station: normal Patient leans: N/A  Psychiatric Specialty Exam: Physical Exam  Constitutional: He is oriented to person, place, and time. He appears well-developed.  HENT:  Head: Normocephalic and atraumatic.  Eyes: Conjunctivae and EOM are normal.  Neck: Normal range of motion.  Respiratory: Effort normal.  Musculoskeletal: Normal range of motion.  Neurological: He is alert and oriented to person, place, and time.    Review of Systems  Constitutional: Positive for malaise/fatigue.  HENT: Negative.   Eyes: Negative.   Respiratory: Negative.    Cardiovascular: Negative.   Gastrointestinal: Negative.   Genitourinary: Negative.   Musculoskeletal: Positive for myalgias.  Skin: Negative.   Neurological: Positive for weakness. Negative for dizziness.  Psychiatric/Behavioral: Positive for depression and substance abuse. Negative for hallucinations, memory loss and suicidal ideas. The patient is not nervous/anxious and does not have insomnia.     Blood pressure 111/75, pulse (!) 107, temperature 97.9 F (36.6 C), temperature source Oral, resp. rate 20, height 6\' 1"  (1.854 m), weight 76.2 kg (168 lb), SpO2 98 %.Body mass index is 22.16 kg/m.  General Appearance: Casual  Eye Contact:  Minimal  Speech:  Garbled  Volume:  Decreased  Mood:  Anxious, Depressed and Irritable  Affect:  Constricted  Thought Process:  Goal Directed  Orientation:  Full (Time, Place, and Person)  Thought Content:  Logical  Suicidal Thoughts:  No  Homicidal Thoughts:  No  Memory:  Immediate;   Fair Recent;   Fair Remote;   Fair  Judgement:  Impaired  Insight:  Shallow  Psychomotor Activity:  Decreased  Concentration:  Concentration: Fair and Attention Span: Fair  Recall:  Fis[employer]Fair  Fund of Knowledge:  Fair  Language:  Fair  Akathisia:  No  Handed:  Right  AIMS (if indicated):     Assets:  Desire for Improvement  ADL's:  Intact  Cognition:  WNL  Sleep:  Number of Hours: 6.5     Treatment Plan Summary: Daily contact with patient to assess and evaluate symptoms and progress in treatment and Medication management   Patient is a 41 year old male with a long history of substance abuse and bipolar disorder. His drug screen was positive for amphetamines. Patient presenting with suicidal thoughts of having overdosed on several substances and being very hopeless.  Depressive symptoms in the setting of methamphetamine withdrawal: Prior diagnosis of bipolar however he also has a very long/severe history of addiction.  U tox + for amphetamines.  Consider  treatment with antidepressant if symptoms persist.   Insomnia/poor impulse control: Patient feels that Seroquel makes him very tired and weak. Couldn't tolerate lower dose.  Seroquel has been d/c.  Continue Trazodone 100 mg po qhs  Substance abuse- continue to monitor and recommend residential  treatment.--Needs residential treatment pt is interested---no beds in [facility] or [facility].    Labs- urine drug screen positive for amphetamines, Liver function tests show elevated AST and ALT, BAL is less than 5, WBC elevated Potassium level normal at 3.7  Not ready for discharge yet,withdrawn, depressed.  Possible d/c in 48 h   Jimmy Footman, MD 05/19/2017, 9:53 AM

## 2017-05-19 NOTE — [facility] Group Notes (Signed)
[facility] LCSW Group Therapy   05/19/2017  9:30 am   Type of Therapy: Group Therapy   Participation Level: Patient invited but did not attend.    Naiyana Barbian, MSW, LCSWA 05/19/2017, 10:54AM 

## 2017-05-19 NOTE — Plan of Care (Signed)
Problem: Coping: Goal: Ability to cope will improve Outcome: Progressing Stayed in dayroom with staff and peers. Was calm and cooperative

## 2017-05-20 MED ORDER — QUETIAPINE FUMARATE 100 MG PO TABS: 50.0000 mg | ORAL_TABLET | Freq: Every day | ORAL | 0 refills | Status: DC

## 2017-05-20 NOTE — [facility] Suicide Risk Assessment (Signed)
[facility] Discharge Suicide Risk Assessment   Principal Problem: [facility](HCC) Discharge Diagnoses:  Patient Active Problem List   Diagnosis Date Noted  . 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 Bipolar 2 disorder, major depressive episode (HCC) [F31.81] 05/15/2017  . Hepatitis C [B19.20] 05/14/2017  . Tobacco use disorder [F17.200] 10/16/2016  . Cannabis use disorder, moderate, dependence (HCC) [F12.20] 10/15/2016      Psychiatric Specialty Exam: ROS  Blood pressure 113/75, pulse 95, temperature 98.1 F (36.7 C), temperature source Oral, resp. rate 20, height 6\' 1"  (1.854 m), weight 76.2 kg (168 lb), SpO2 97 %.Body mass index is 22.16 kg/m.                                                Sleep:  Number of Hours: 6.75       Mental Status Per Nursing Assessment::   On Admission:     Demographic Factors:  Male, Caucasian, Low socioeconomic status and Unemployed  Loss Factors: Financial problems/change in socioeconomic status  Historical Factors: Impulsivity  Risk Reduction Factors:   Sense of responsibility to family, Living with another person, especially a relative and Positive social support  Pt denies having any access to guns  Continued Clinical Symptoms:  Depression:   Impulsivity Alcohol/Substance Abuse/Dependencies Previous Psychiatric Diagnoses and Treatments  Cognitive Features That Contribute To Risk:  Polarized thinking    Suicide Risk:  Minimal: No identifiable suicidal ideation.  Patients presenting with no risk factors but with morbid ruminations; may be classified as minimal risk based on the severity of the depressive symptoms     Jimmy FootmanHernandez-Gonzalez,  Nazly Digilio, MD 05/20/2017, 12:31 PM

## 2017-05-20 NOTE — Progress Notes (Signed)
  [facility] :  Will you be returning to the same living situation after discharge:  Yes,  returning home with parents. At discharge, do you have transportation home?: Yes,  mother will pick patient up. Do you have the ability to pay for your medications: No.  Release of information consent forms completed and in the chart;  Patient's signature needed at discharge.  Patient to Follow up at: Follow-up [employer]    [facility]. Schedule an appointment as soon as possible for a visit.   Why:  please follow up with primary care.  You need to have your thyroid function recheck. Also need a referral for dental services Contact information: [address] [place] [address] [phone]        [facility], [facility]. Go on 05/21/2017.   Why:  Please follow-up with [facility] on August 17th at 9:30 AM to [facility] for your medication management appt. Contact information: [address] [place] [address] [phone]           Next level of care provider has [facility] Link:no  Safety Planning and Suicide Prevention discussed: Yes,  SPE completed with patient and mother.  Have you used any form of tobacco in the last 30 days? (Cigarettes, Smokeless Tobacco, Cigars, and/or Pipes): Yes  Has patient been referred to the Quitline?: Patient refused referral  Patient has been referred for addiction treatment: Yes  Lynden OxfordKadijah R Rosales [name], LCSWA 05/20/2017, 2:15 PM

## 2017-05-20 NOTE — Progress Notes (Signed)
Recreation Therapy Notes  Date: 08.16.18 Time: 1:00 pm Location: Craft Room  Group Topic: Leisure Education  Goal Area(s) Addresses:  Patient will identify healthy leisure activities. Patient will identify positive emotions.  Behavioral Response: Did not attend  Intervention: Leisure Time  Activity: Patients were given 2 pieces of paper and were instructed to write healthy leisure activities on each piece. Patients were given a Leisure Time Clock worksheet to complete. Patients were instructed as a group to come up with 10 positive emotions. Patients were instructed to match the activities to the emotions they listed.  Education: LRT educated patients on what they need to participate in leisure.  Education Outcome: Patient did not attend group.  Clinical Observations/Feedback: Patient did not attend group.  Jacquelynn CreeGreene,Arafat Cocuzza M, LRT/CTRS 05/20/2017 2:06 PM

## 2017-05-20 NOTE — Progress Notes (Signed)
D: Pt denies SI/HI/AVH, affect is blunted, mood is irritable, angry and hostile towards staff.  Pt is not cooperative with treatment this shift. Patient isolated to the room and is not interacting with peers and staff appropriately.  A: Pt was offered support and encouragement. Pt was offered scheduled medications. Pt was encouraged to attend groups. Q 15 minute checks were done for safety.  R:Pt did not attend evening group. Patient is not complaint with evening medication. Pt is not  receptive to treatment. Safety maintained on unit, will continue to monitor.

## 2017-05-20 NOTE — [facility] Group Notes (Signed)
[facility] LCSW Group Therapy Note  Date/Time: 05/20/17, 0930  Type of Therapy/Topic:  Group Therapy:  Balance in Life  Participation Level:  Did not attend  Description of Group:    This group will address the concept of balance and how it feels and looks when one is unbalanced. Patients will be encouraged to process areas in their lives that are out of balance, and identify reasons for remaining unbalanced. Facilitators will guide patients utilizing problem- solving interventions to address and correct the stressor making their life unbalanced. Understanding and applying boundaries will be explored and addressed for obtaining  and maintaining a balanced life. Patients will be encouraged to explore ways to assertively make their unbalanced needs known to significant others in their lives, using other group members and facilitator for support and feedback.  Therapeutic Goals: 1. Patient will identify two or more emotions or situations they have that consume much of in their lives. 2. Patient will identify signs/triggers that life has become out of balance:  3. Patient will identify two ways to set boundaries in order to achieve balance in their lives:  4. Patient will demonstrate ability to communicate their needs through discussion and/or role plays  Summary of Patient Progress:          Therapeutic Modalities:   Cognitive Behavioral Therapy Solution-Focused Therapy Assertiveness Training  Greg Kayden Hutmacher, LCSW 

## 2017-05-20 NOTE — Progress Notes (Signed)
Denies SI/HI/AVH.   Discharge instructions given, verbalized understanding.  Prescription given.  Personal belongings returned.  Escorted off unit by staff to meet mother to travel home.

## 2017-05-20 NOTE — Progress Notes (Deleted)
Recreation Therapy Notes  Date: 08.16.18 Time: 9:30 am Location: Craft Room  Group Topic: Leisure Education  Goal Area(s) Addresses:  Patient will identify things they are grateful for. Patient will identify how being grateful can influence decision making.  Behavioral Response: Did not attend  Intervention: Grateful Wheel  Activity: Patients were given an I Am Grateful For worksheet and were instructed to write things they are grateful for under each category.  Education: LRT educated patients on leisure.  Education Outcome: Patient did not attend group.   Clinical Observations/Feedback: Patient did not attend group.  Nillie Bartolotta M, LRT/CTRS 05/20/2017 1:55 PM 

## 2017-05-20 NOTE — Discharge Summary (Signed)
Physician Discharge Summary Note  Patient:  [name] is an 41 y.o., male MRN:  [id number] DOB:  1975-12-23 Patient phone:  [phone] (home)  Patient address:   [address] [place] [address],  Total Time spent with patient: 30 minutes  Date of Admission:  05/15/2017 Date of Discharge: 05/20/17  Reason for Admission:  SI  Principal Problem: Bipolar 2 disorder, major depressive episode [facility]) Discharge Diagnoses: Patient Active Problem List   Diagnosis Date Noted  . 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 Bipolar 2 disorder, major depressive episode (HCC) [F31.81] 05/15/2017  . Hepatitis C [B19.20] 05/14/2017  . Tobacco use disorder [F17.200] 10/16/2016  . Cannabis use disorder, moderate, dependence (HCC) [F12.20] 10/15/2016    History of Present Illness: Vincent Schupp Manessis an 41 y.o.malewho presents to the ER due to voicing SI. Patient came voluntarily and stated that he had been awake for 10 days smoking methamphetamine. He reported having poor sleep and appetite. Stated that his mood was significantly getting worse. Patient states he attempted to end his life by overdosing on drugs. He states, "I took enough (illicit) drugs to kill myself but I didn't work." He further reports, he has "binged on [employer] Meth (Methamphetamine), Cocaine, Cannabis &Pills (Benzo's) for ten days and ain't had no sleep."   Stressors he has identified includes; risk of homelessness, family problems and untreated mental illness. He states, "drugs aint' my number one problem but  my Bipolar is the issue. I use drugs to help me with it. Cause if I wanted to stop, I'll just stop."  In th eER patient was refusing lithium saying it made him drowsy and was not really helpful.  [name] is lying in bed and reports he is cold. He reports being depressed and feeling hopeless. He is able to contract for safety on the unit. Denies HI/AH  /VH.  Total Time spent with patient: 1 hour  Past Psychiatric History: Patient has had admissions in the past for suicidal ideation and self injury with a diagnosis of bipolar disorder. Was last discharged on a combination of Seroquel and lithium and Luvox. Has not been following up at all  Past Medical History:  Past Medical History:  Diagnosis Date  . Bipolar disorder (HCC)   . Depression   . Hepatitis C   . Sinus congestion     Past Surgical History:  Procedure Laterality Date  . ANKLE SURGERY    . HERNIA REPAIR    . HERNIA REPAIR    . TENDON REPAIR     Family History:  Family History  Problem Relation Age of Onset  . Heart failure Mother    Family Psychiatric  History: Patient was living with his mother  Social History:  History  Alcohol Use  . 7.2 oz/week  . 12 Cans of beer per week    Comment: reports weekend use (fri/sat night)     History  Drug Use  . Frequency: 3.0 times per week  . Types: Marijuana, Methamphetamines    Comment: Pt states that he last used meth yesterday morning     Social History   Social History  . Marital status: Single    Spouse name: N/A  . Number of children: N/A  . Years of education: N/A   Social History Main Topics  . Smoking status: Current Every Day Smoker    Packs/day: 1.00    Types: Cigarettes  . Smokeless tobacco: Never Used  .  Alcohol use 7.2 oz/week    12 Cans of beer per week     Comment: reports weekend use (fri/sat night)  . Drug use: Yes    Frequency: 3.0 times per week    Types: Marijuana, Methamphetamines     Comment: Pt states that he last used meth yesterday morning   . Sexual activity: Not Asked   Other Topics Concern  . None   Social History Narrative  . None    Hospital Course:    Patient is a 41 year old male with a long history of substance abuse and bipolar disorder. His drug screen was positive for amphetamines. Patient presenting with suicidal thoughts of having overdosed on several  substances and being very hopeless.  Depressive symptoms in the setting of methamphetamine withdrawal: Prior diagnosis of bipolar however he also has a very long/severe history of addiction.  U tox + for amphetamines.  The patient requested low-dose Seroquel prior to discharge. I will start him on 50-100 mg by mouth daily at bedtime.  Substance abuse- continue to monitor and recommend residential treatment.--no beds in [facility] or [facility].   patient was not interested in going to faith based religious programs  Labs- urine drug screen positive for amphetamines, Liverfunction tests show elevated AST and ALT, BAL is lessthan 5, WBC elevated Potassium level normal at 3.7   Patient feels better and ready for discharge. He denies suicidality, homicidality or hallucinations. He denies having any access to guns.  Staff working with the patient feels he is much improved, ready for discharge and they did not voice any concerns about his safety.  Patient will be returning to live with his family members.  Physical Findings: AIMS: Facial and Oral Movements Muscles of Facial Expression: None, normal Lips and Perioral Area: None, normal Jaw: None, normal Tongue: None, normal,Extremity Movements Upper (arms, wrists, hands, fingers): None, normal Lower (legs, knees, ankles, toes): None, normal, Trunk Movements Neck, shoulders, hips: None, normal, Overall Severity Severity of abnormal movements (highest score from questions above): None, normal Incapacitation due to abnormal movements: None, normal Patient's awareness of abnormal movements (rate only patient's report): No Awareness, Dental Status Current problems with teeth and/or dentures?: Yes Does patient usually wear dentures?: No  CIWA:  CIWA-Ar Total: 0 COWS:  COWS Total Score: 2  Musculoskeletal: Strength & Muscle Tone: within normal limits Gait & Station: normal Patient leans: N/A  Psychiatric Specialty Exam: Physical Exam   Constitutional: He is oriented to person, place, and time. He appears well-developed and well-nourished.  HENT:  Head: Normocephalic and atraumatic.  Eyes: Conjunctivae and EOM are normal.  Neck: Normal range of motion.  Respiratory: Effort normal.  Musculoskeletal: Normal range of motion.  Neurological: He is alert and oriented to person, place, and time.    Review of Systems  Constitutional: Negative.   HENT: Negative.   Eyes: Negative.   Respiratory: Negative.   Cardiovascular: Negative.   Gastrointestinal: Negative.   Genitourinary: Negative.   Musculoskeletal: Negative.   Skin: Negative.   Neurological: Negative.   Endo/Heme/Allergies: Negative.   Psychiatric/Behavioral: Positive for depression and substance abuse. Negative for hallucinations, memory loss and suicidal ideas. The patient is not nervous/anxious and does not have insomnia.     Blood pressure 113/75, pulse 95, temperature 98.1 F (36.7 C), temperature source Oral, resp. rate 20, height 6\' 1"  (1.854 m), weight 76.2 kg (168 lb), SpO2 97 %.Body mass index is 22.16 kg/m.  General Appearance: Fairly Groomed  Eye Contact:  Good  Speech:  Clear  and Coherent  Volume:  Normal  Mood:  Euthymic  Affect:  Appropriate and Congruent  Thought Process:  Linear and Descriptions of Associations: Intact  Orientation:  Full (Time, Place, and Person)  Thought Content:  Hallucinations: None  Suicidal Thoughts:  No  Homicidal Thoughts:  No  Memory:  Immediate;   Good Recent;   Good Remote;   Good  Judgement:  Fair  Insight:  Fair  Psychomotor Activity:  Normal  Concentration:  Concentration: Good and Attention Span: Good  Recall:  Good  Fund of Knowledge:  Good  Language:  Good  Akathisia:  No  Handed:    AIMS (if indicated):     Assets:  Communication Skills Social Support  ADL's:  Intact  Cognition:  WNL  Sleep:  Number of Hours: 6.75     Have you used any form of tobacco in the last 30 days? (Cigarettes,  Smokeless Tobacco, Cigars, and/or Pipes): Yes  Has this patient used any form of tobacco in the last 30 days? (Cigarettes, Smokeless Tobacco, Cigars, and/or Pipes) Yes, Yes, A prescription for an FDA-approved tobacco cessation medication was offered at discharge and the patient refused  Blood Alcohol level:  Lab Results  Component Value Date   [facility]   ETH <5 10/14/2016    Metabolic Disorder Labs:  Lab Results  Component Value Date   HGBA1C 5.6 10/16/2016   MPG 114 10/16/2016   Lab Results  Component Value Date   PROLACTIN 9.8 05/16/2017   PROLACTIN 13.9 10/16/2016   Lab Results  Component Value Date   CHOL 122 05/16/2017   TRIG 101 05/16/2017   HDL 45 05/16/2017   CHOLHDL 2.7 05/16/2017   VLDL 20 05/16/2017   LDLCALC 57 05/16/2017   LDLCALC 90 10/16/2016   Results for Paulla DollyMANESS, [place]ord[place] JOE (MRN [id number]) as of 05/20/2017 12:49  Ref. Range 05/14/2017 15:21 05/14/2017 16:42 05/15/2017 20:34 05/16/2017 06:46 05/16/2017 06:48  COMPREHENSIVE METABOLIC PANEL Unknown Rpt (A)      Sodium Latest Ref Range: 135 - 145 mmol/L 136      Potassium Latest Ref Range: 3.5 - 5.1 mmol/L 3.0 (L)  3.7    Chloride Latest Ref Range: 101 - 111 mmol/L 103      CO2 Latest Ref Range: 22 - 32 mmol/L 25      Glucose Latest Ref Range: 65 - 99 mg/dL 147104 (H)      BUN Latest Ref Range: 6 - 20 mg/dL 22 (H)      Creatinine Latest Ref Range: 0.61 - 1.24 mg/dL 8.291.09      Calcium Latest Ref Range: 8.9 - 10.3 mg/dL 8.9      Anion gap Latest Ref Range: 5 - 15  8      Alkaline Phosphatase Latest Ref Range: 38 - 126 U/L 60      Albumin Latest Ref Range: 3.5 - 5.0 g/dL 3.9      AST Latest Ref Range: 15 - 41 U/L 105 (H)      ALT Latest Ref Range: 17 - 63 U/L 131 (H)      Total Protein Latest Ref Range: 6.5 - 8.1 g/dL 6.9      Total Bilirubin Latest Ref Range: 0.3 - 1.2 mg/dL 0.6      GFR, Est African American Latest Ref Range: >60 mL/min >60      GFR, Est Non African American Latest Ref Range: >60 mL/min >60       Total CHOL/HDL Ratio Latest Units: RATIO  2.7  Cholesterol Latest Ref Range: 0 - 200 mg/dL     696  HDL Cholesterol Latest Ref Range: >40 mg/dL     45  LDL (calc) Latest Ref Range: 0 - 99 mg/dL     57  Triglycerides Latest Ref Range: <150 mg/dL     295  VLDL Latest Ref Range: 0 - 40 mg/dL     20  WBC Latest Ref Range: 3.8 - 10.6 K/uL 11.7 (H)      RBC Latest Ref Range: 4.40 - 5.90 MIL/uL 4.55      Hemoglobin Latest Ref Range: 13.0 - 18.0 g/dL 28.4      HCT Latest Ref Range: 40.0 - 52.0 % 38.6 (L)      MCV Latest Ref Range: 80.0 - 100.0 fL 84.9      MCH Latest Ref Range: 26.0 - 34.0 pg 29.1      MCHC Latest Ref Range: 32.0 - 36.0 g/dL 13.2      RDW Latest Ref Range: 11.5 - 14.5 % 13.3      Platelets Latest Ref Range: 150 - 440 K/uL 280      Acetaminophen (Tylenol), S Latest Ref Range: 10 - 30 ug/mL <10 (L)      Salicylate Lvl Latest Ref Range: 2.8 - 30.0 mg/dL 44.0      Prolactin Latest Ref Range: 4.0 - 15.2 ng/mL     9.8  TSH Latest Ref Range: 0.350 - 4.500 uIU/mL     0.177 (L)  Alcohol, Ethyl (B) Latest Ref Range: <5 mg/dL <5      Amphetamines, Ur Screen Latest Ref Range: NONE DETECTED  POSITIVE (A)      Barbiturates, Ur Screen Latest Ref Range: NONE DETECTED  NONE DETECTED      Benzodiazepine, Ur Scrn Latest Ref Range: NONE DETECTED  NONE DETECTED      Cocaine Metabolite,Ur [place] Latest Ref Range: NONE DETECTED  NONE DETECTED      Methadone Scn, Ur Latest Ref Range: NONE DETECTED  NONE DETECTED      MDMA (Ecstasy)Ur Screen Latest Ref Range: NONE DETECTED  NONE DETECTED      Cannabinoid 50 Ng, Ur [place] Latest Ref Range: NONE DETECTED  NONE DETECTED      Opiate, Ur Screen Latest Ref Range: NONE DETECTED  NONE DETECTED      Phencyclidine (PCP) Ur S Latest Ref Range: NONE DETECTED  NONE DETECTED      Tricyclic, Ur Screen Latest Ref Range: NONE DETECTED  NONE DETECTED      MISC [facility] TEST (SEND OUT) Unknown    Rpt   [facility] test name Unknown    HEMOGLOBIN A1C   Misc [facility] result  Unknown    COMMENT   EKG 12-LEAD Unknown  Rpt       See Psychiatric Specialty Exam and Suicide Risk Assessment completed by Attending Physician prior to discharge.  Discharge destination:  Home  Is patient on multiple antipsychotic therapies at discharge:  No   Has Patient had three or more failed trials of antipsychotic monotherapy by history:  No  Recommended Plan for Multiple Antipsychotic Therapies: NA   Allergies as of 05/20/2017   No Known Allergies     Medication List    TAKE these medications     Indication  QUEtiapine 100 MG tablet Commonly known as:  SEROQUEL Take 0.5-1 tablets (50-100 mg total) by mouth at bedtime.  Indication:  mood, insomnia      Follow-up Information  [facility], [facility] Schedule an appointment as soon as possible for a visit.   Why:  please follow up with primary care.  You need to have your thyroid function recheck. Also need a referral for dental services Contact information: [address] [place] [address] [phone]        Inc, [facility]. Go on 05/20/2017.   Contact information: [address] [place] [address] [phone]           >30 minutes. >50 % of the time was spent in coordination of care.  Signed: Jimmy Footman, MD 05/20/2017, 1:38 PM

## 2017-09-05 ENCOUNTER — Emergency Department (HOSPITAL_COMMUNITY): Payer: Self-pay

## 2017-09-05 ENCOUNTER — Encounter (HOSPITAL_COMMUNITY): Payer: Self-pay

## 2017-09-05 ENCOUNTER — Inpatient Hospital Stay (HOSPITAL_COMMUNITY)
Admission: EM | Admit: 2017-09-05 | Discharge: 2017-09-08 | DRG: 493 | Disposition: A | Discharge status: Home / Self Care | Payer: Self-pay | Attending: Orthopedic Surgery | Admitting: Orthopedic Surgery

## 2017-09-05 DIAGNOSIS — F19929 Other psychoactive substance use, unspecified with intoxication, unspecified: Secondary | ICD-10-CM | POA: Diagnosis present

## 2017-09-05 DIAGNOSIS — S82402A Unspecified fracture of shaft of left fibula, initial encounter for closed fracture: Secondary | ICD-10-CM

## 2017-09-05 DIAGNOSIS — M898X9 Other specified disorders of bone, unspecified site: Secondary | ICD-10-CM | POA: Diagnosis present

## 2017-09-05 DIAGNOSIS — B1920 Unspecified viral hepatitis C without hepatic coma: Secondary | ICD-10-CM | POA: Diagnosis present

## 2017-09-05 DIAGNOSIS — F1210 Cannabis abuse, uncomplicated: Secondary | ICD-10-CM | POA: Diagnosis present

## 2017-09-05 DIAGNOSIS — F15129 Other stimulant abuse with intoxication, unspecified: Secondary | ICD-10-CM | POA: Diagnosis present

## 2017-09-05 DIAGNOSIS — Z599 Problem related to housing and economic circumstances, unspecified: Secondary | ICD-10-CM

## 2017-09-05 DIAGNOSIS — R4587 Impulsiveness: Secondary | ICD-10-CM | POA: Diagnosis present

## 2017-09-05 DIAGNOSIS — S82202A Unspecified fracture of shaft of left tibia, initial encounter for closed fracture: Secondary | ICD-10-CM | POA: Diagnosis present

## 2017-09-05 DIAGNOSIS — D62 Acute posthemorrhagic anemia: Secondary | ICD-10-CM | POA: Diagnosis not present

## 2017-09-05 DIAGNOSIS — F17200 Nicotine dependence, unspecified, uncomplicated: Secondary | ICD-10-CM | POA: Diagnosis present

## 2017-09-05 DIAGNOSIS — F1510 Other stimulant abuse, uncomplicated: Secondary | ICD-10-CM | POA: Diagnosis present

## 2017-09-05 DIAGNOSIS — S82252A Displaced comminuted fracture of shaft of left tibia, initial encounter for closed fracture: Principal | ICD-10-CM | POA: Diagnosis present

## 2017-09-05 DIAGNOSIS — Z79899 Other long term (current) drug therapy: Secondary | ICD-10-CM

## 2017-09-05 DIAGNOSIS — F17290 Nicotine dependence, other tobacco product, uncomplicated: Secondary | ICD-10-CM | POA: Diagnosis present

## 2017-09-05 DIAGNOSIS — Z560 Unemployment, unspecified: Secondary | ICD-10-CM

## 2017-09-05 DIAGNOSIS — Z885 Allergy status to narcotic agent status: Secondary | ICD-10-CM

## 2017-09-05 DIAGNOSIS — Z419 Encounter for procedure for purposes other than remedying health state, unspecified: Secondary | ICD-10-CM

## 2017-09-05 DIAGNOSIS — S82452A Displaced comminuted fracture of shaft of left fibula, initial encounter for closed fracture: Secondary | ICD-10-CM | POA: Diagnosis present

## 2017-09-05 DIAGNOSIS — T148XXA Other injury of unspecified body region, initial encounter: Secondary | ICD-10-CM

## 2017-09-05 HISTORY — DX: Cannabis abuse, uncomplicated: F12.10

## 2017-09-05 HISTORY — DX: Other specified abnormal immunological findings in serum: R76.8

## 2017-09-05 HISTORY — DX: Other stimulant abuse, uncomplicated: F15.10

## 2017-09-05 HISTORY — DX: Other psychoactive substance abuse, uncomplicated: F19.10

## 2017-09-05 HISTORY — DX: Anxiety disorder, unspecified: F41.9

## 2017-09-05 LAB — PROTIME-INR
INR: 1.11
PROTHROMBIN TIME: 14.2 s (ref 11.4–15.2)

## 2017-09-05 LAB — SAMPLE TO BLOOD BANK

## 2017-09-05 LAB — COMPREHENSIVE METABOLIC PANEL
ALT: 59 U/L (ref 17–63)
AST: 56 U/L — AB (ref 15–41)
Albumin: 3.8 g/dL (ref 3.5–5.0)
Alkaline Phosphatase: 46 U/L (ref 38–126)
Anion gap: 15 (ref 5–15)
BUN: 11 mg/dL (ref 6–20)
CHLORIDE: 101 mmol/L (ref 101–111)
CO2: 21 mmol/L — AB (ref 22–32)
CREATININE: 1.24 mg/dL (ref 0.61–1.24)
Calcium: 8.9 mg/dL (ref 8.9–10.3)
GFR calc Af Amer: >60 mL/min (ref >60)
GFR calc non Af Amer: >60 mL/min (ref >60)
Glucose, Bld: 90 mg/dL (ref 65–99)
POTASSIUM: 3.3 mmol/L — AB (ref 3.5–5.1)
SODIUM: 137 mmol/L (ref 135–145)
Total Bilirubin: 0.9 mg/dL (ref 0.3–1.2)
Total Protein: 6.5 g/dL (ref 6.5–8.1)

## 2017-09-05 LAB — CBC
HEMATOCRIT: 39.1 % (ref 39.0–52.0)
Hemoglobin: 13.4 g/dL (ref 13.0–17.0)
MCH: 29.3 pg (ref 26.0–34.0)
MCHC: 34.3 g/dL (ref 30.0–36.0)
MCV: 85.4 fL (ref 78.0–100.0)
Platelets: 202 10*3/uL (ref 150–400)
RBC: 4.58 MIL/uL (ref 4.22–5.81)
RDW: 12.8 % (ref 11.5–15.5)
WBC: 7.2 10*3/uL (ref 4.0–10.5)

## 2017-09-05 LAB — I-STAT CHEM 8, ED
BUN: 11 mg/dL (ref 6–20)
Calcium, Ion: 1.09 mmol/L — ABNORMAL LOW (ref 1.15–1.40)
Chloride: 101 mmol/L (ref 101–111)
Creatinine, Ser: 1 mg/dL (ref 0.61–1.24)
Glucose, Bld: 89 mg/dL (ref 65–99)
HEMATOCRIT: 39 % (ref 39.0–52.0)
Hemoglobin: 13.3 g/dL (ref 13.0–17.0)
POTASSIUM: 3.4 mmol/L — AB (ref 3.5–5.1)
SODIUM: 140 mmol/L (ref 135–145)
TCO2: 25 mmol/L (ref 22–32)

## 2017-09-05 LAB — ETHANOL: Alcohol, Ethyl (B): <10 mg/dL (ref <10)

## 2017-09-05 LAB — I-STAT CG4 LACTIC ACID, ED: Lactic Acid, Venous: 5.87 mmol/L (ref 0.5–1.9)

## 2017-09-05 MED ORDER — HYDROMORPHONE HCL 1 MG/ML IJ SOLN
INTRAMUSCULAR | Status: AC | PRN
  Administered 2017-09-05 (×2): 1 mg via INTRAVENOUS

## 2017-09-05 MED ORDER — PROPOFOL 10 MG/ML IV BOLUS
1.0000 mg/kg | Freq: Once | INTRAVENOUS | Status: DC
  Filled 2017-09-05: qty 20

## 2017-09-05 MED ORDER — PROPOFOL 10 MG/ML IV BOLUS
INTRAVENOUS | Status: AC | PRN
  Administered 2017-09-05: 100 mg via INTRAVENOUS

## 2017-09-05 MED ORDER — SODIUM CHLORIDE 0.9 % IV BOLUS (SEPSIS)
1000.0000 mL | Freq: Once | INTRAVENOUS | Status: AC
  Administered 2017-09-05: 1000 mL via INTRAVENOUS

## 2017-09-05 MED ORDER — IBUPROFEN 800 MG PO TABS
800.0000 mg | ORAL_TABLET | Freq: Once | ORAL | Status: AC
  Administered 2017-09-05: 800 mg via ORAL
  Filled 2017-09-05: qty 1

## 2017-09-05 MED ORDER — KETOROLAC TROMETHAMINE 30 MG/ML IJ SOLN
30.0000 mg | Freq: Once | INTRAMUSCULAR | Status: AC
  Administered 2017-09-05: 30 mg via INTRAVENOUS
  Filled 2017-09-05: qty 1

## 2017-09-05 MED ORDER — HYDROMORPHONE HCL 1 MG/ML IJ SOLN
INTRAMUSCULAR | Status: AC
  Filled 2017-09-05: qty 1

## 2017-09-05 MED ORDER — SODIUM CHLORIDE 0.9 % IV SOLN
INTRAVENOUS | Status: DC
  Administered 2017-09-05: 21:00:00 via INTRAVENOUS

## 2017-09-05 MED ORDER — HYDROMORPHONE HCL 1 MG/ML IJ SOLN: 1.0000 mg | Freq: Once | INTRAMUSCULAR | Status: DC

## 2017-09-05 NOTE — ED Notes (Signed)
Dr Rush Landmarkegeler given a copy of lactic acid results 5.87

## 2017-09-05 NOTE — ED Notes (Signed)
Pt. Given urinal to void
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
## 2017-09-05 NOTE — Progress Notes (Signed)
Orthopedic Tech Progress Note Patient Details:  [facility] 05/16/1976 [id number]  Ortho Devices Type of Ortho Device: Post (short leg) splint, Stirrup splint Ortho Device/Splint Location: lle plaster splints Ortho Device/Splint Interventions: Ordered, Application Assisted dr with reduction and splint application.  Trinna PostMartinez, Esly Selvage J 09/05/2017, 11:42 PM
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
## 2017-09-05 NOTE — ED Notes (Addendum)
Jocelyn code for family to call

## 2017-09-05 NOTE — ED Notes (Signed)
Called x-ray to verify post reduction x-rays ordered

## 2017-09-05 NOTE — ED Notes (Signed)
Pt given [name] sandwich and ginger ale per Dr.Tegeler. Will be NPO after midnight for surgery tomorrow. Pt also requesting toradol which was given for "muscle spasms" per patient.
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
## 2017-09-06 ENCOUNTER — Inpatient Hospital Stay (HOSPITAL_COMMUNITY): Payer: Self-pay

## 2017-09-06 ENCOUNTER — Encounter (HOSPITAL_COMMUNITY)
Admission: EM | Disposition: A | Discharge status: Home / Self Care | Payer: Self-pay | Source: Home / Self Care | Attending: Orthopedic Surgery

## 2017-09-06 ENCOUNTER — Encounter (HOSPITAL_COMMUNITY): Payer: Self-pay | Admitting: *Deleted

## 2017-09-06 ENCOUNTER — Inpatient Hospital Stay (HOSPITAL_COMMUNITY): Payer: Self-pay | Admitting: Certified Registered Nurse Anesthetist

## 2017-09-06 DIAGNOSIS — S82202A Unspecified fracture of shaft of left tibia, initial encounter for closed fracture: Secondary | ICD-10-CM | POA: Diagnosis present

## 2017-09-06 DIAGNOSIS — F19929 Other psychoactive substance use, unspecified with intoxication, unspecified: Secondary | ICD-10-CM | POA: Diagnosis present

## 2017-09-06 DIAGNOSIS — F1910 Other psychoactive substance abuse, uncomplicated: Secondary | ICD-10-CM | POA: Insufficient documentation

## 2017-09-06 DIAGNOSIS — F17200 Nicotine dependence, unspecified, uncomplicated: Secondary | ICD-10-CM | POA: Diagnosis present

## 2017-09-06 HISTORY — PX: TIBIA IM NAIL INSERTION: SHX2516

## 2017-09-06 LAB — SURGICAL PCR SCREEN
MRSA, PCR: NEGATIVE
STAPHYLOCOCCUS AUREUS: POSITIVE — AB

## 2017-09-06 LAB — CBC
HCT: 35.8 % — ABNORMAL LOW (ref 39.0–52.0)
HEMOGLOBIN: 12.1 g/dL — AB (ref 13.0–17.0)
MCH: 29.4 pg (ref 26.0–34.0)
MCHC: 33.8 g/dL (ref 30.0–36.0)
MCV: 86.9 fL (ref 78.0–100.0)
Platelets: 183 10*3/uL (ref 150–400)
RBC: 4.12 MIL/uL — AB (ref 4.22–5.81)
RDW: 12.9 % (ref 11.5–15.5)
WBC: 8.8 10*3/uL (ref 4.0–10.5)

## 2017-09-06 LAB — BASIC METABOLIC PANEL
Anion gap: 9 (ref 5–15)
BUN: 13 mg/dL (ref 6–20)
CHLORIDE: 109 mmol/L (ref 101–111)
CO2: 22 mmol/L (ref 22–32)
Calcium: 8.5 mg/dL — ABNORMAL LOW (ref 8.9–10.3)
Creatinine, Ser: 0.91 mg/dL (ref 0.61–1.24)
Glucose, Bld: 90 mg/dL (ref 65–99)
POTASSIUM: 3.8 mmol/L (ref 3.5–5.1)
SODIUM: 140 mmol/L (ref 135–145)

## 2017-09-06 LAB — CREATININE, SERUM
CREATININE: 0.82 mg/dL (ref 0.61–1.24)
GFR calc Af Amer: >60 mL/min (ref >60)

## 2017-09-06 LAB — HIV ANTIBODY (ROUTINE TESTING W REFLEX): HIV Screen 4th Generation wRfx: NONREACTIVE

## 2017-09-06 LAB — LACTIC ACID, PLASMA: LACTIC ACID, VENOUS: 1.2 mmol/L (ref 0.5–1.9)

## 2017-09-06 SURGERY — INSERTION, INTRAMEDULLARY ROD, TIBIA
Anesthesia: General | Laterality: Left

## 2017-09-06 MED ORDER — DEXAMETHASONE SODIUM PHOSPHATE 10 MG/ML IJ SOLN
INTRAMUSCULAR | Status: DC | PRN
  Administered 2017-09-06: 4 mg via INTRAVENOUS

## 2017-09-06 MED ORDER — FENTANYL CITRATE (PF) 250 MCG/5ML IJ SOLN
INTRAMUSCULAR | Status: AC
  Filled 2017-09-06: qty 5

## 2017-09-06 MED ORDER — PHENYLEPHRINE 40 MCG/ML (10ML) SYRINGE FOR IV PUSH (FOR BLOOD PRESSURE SUPPORT)
PREFILLED_SYRINGE | INTRAVENOUS | Status: DC | PRN
  Administered 2017-09-06 (×2): 80 ug via INTRAVENOUS
  Administered 2017-09-06: 40 ug via INTRAVENOUS
  Administered 2017-09-06: 80 ug via INTRAVENOUS

## 2017-09-06 MED ORDER — LIDOCAINE 2% (20 MG/ML) 5 ML SYRINGE
INTRAMUSCULAR | Status: AC
  Filled 2017-09-06: qty 5

## 2017-09-06 MED ORDER — SODIUM CHLORIDE 0.9 % IV SOLN: INTRAVENOUS | Status: DC

## 2017-09-06 MED ORDER — ONDANSETRON HCL 4 MG/2ML IJ SOLN
INTRAMUSCULAR | Status: AC
  Filled 2017-09-06: qty 2

## 2017-09-06 MED ORDER — ONDANSETRON HCL 4 MG/2ML IJ SOLN: 4.0000 mg | Freq: Four times a day (QID) | INTRAMUSCULAR | Status: DC | PRN

## 2017-09-06 MED ORDER — DOCUSATE SODIUM 100 MG PO CAPS
100.0000 mg | ORAL_CAPSULE | Freq: Two times a day (BID) | ORAL | Status: DC
  Administered 2017-09-06 – 2017-09-08 (×4): 100 mg via ORAL
  Filled 2017-09-06 (×5): qty 1

## 2017-09-06 MED ORDER — CEFAZOLIN SODIUM-DEXTROSE 2-4 GM/100ML-% IV SOLN
2.0000 g | INTRAVENOUS | Status: AC
  Administered 2017-09-06: 2 g via INTRAVENOUS
  Filled 2017-09-06: qty 100

## 2017-09-06 MED ORDER — DEXMEDETOMIDINE HCL IN NACL 200 MCG/50ML IV SOLN
INTRAVENOUS | Status: DC | PRN
  Administered 2017-09-06: 4 ug via INTRAVENOUS
  Administered 2017-09-06 (×4): 8 ug via INTRAVENOUS
  Administered 2017-09-06: 4 ug via INTRAVENOUS

## 2017-09-06 MED ORDER — ACETAMINOPHEN 325 MG PO TABS: 650.0000 mg | ORAL_TABLET | Freq: Four times a day (QID) | ORAL | Status: DC | PRN

## 2017-09-06 MED ORDER — PHENYLEPHRINE 40 MCG/ML (10ML) SYRINGE FOR IV PUSH (FOR BLOOD PRESSURE SUPPORT)
PREFILLED_SYRINGE | INTRAVENOUS | Status: AC
  Filled 2017-09-06: qty 10

## 2017-09-06 MED ORDER — ONDANSETRON HCL 4 MG/2ML IJ SOLN
INTRAMUSCULAR | Status: DC | PRN
  Administered 2017-09-06: 4 mg via INTRAVENOUS

## 2017-09-06 MED ORDER — ONDANSETRON HCL 4 MG PO TABS: 4.0000 mg | ORAL_TABLET | Freq: Four times a day (QID) | ORAL | Status: DC | PRN

## 2017-09-06 MED ORDER — METOCLOPRAMIDE HCL 5 MG PO TABS: 5.0000 mg | ORAL_TABLET | Freq: Three times a day (TID) | ORAL | Status: DC | PRN

## 2017-09-06 MED ORDER — CEFAZOLIN SODIUM-DEXTROSE 1-4 GM/50ML-% IV SOLN
1.0000 g | Freq: Four times a day (QID) | INTRAVENOUS | Status: AC
  Administered 2017-09-06 – 2017-09-07 (×3): 1 g via INTRAVENOUS
  Filled 2017-09-06 (×3): qty 50

## 2017-09-06 MED ORDER — LACTATED RINGERS IV SOLN
Freq: Once | INTRAVENOUS | Status: AC
  Administered 2017-09-06: 09:00:00 via INTRAVENOUS

## 2017-09-06 MED ORDER — DOCUSATE SODIUM 100 MG PO CAPS
100.0000 mg | ORAL_CAPSULE | Freq: Two times a day (BID) | ORAL | Status: DC
  Administered 2017-09-06: 100 mg via ORAL
  Filled 2017-09-06: qty 1

## 2017-09-06 MED ORDER — SUGAMMADEX SODIUM 200 MG/2ML IV SOLN
INTRAVENOUS | Status: DC | PRN
  Administered 2017-09-06: 40 mg via INTRAVENOUS
  Administered 2017-09-06: 160 mg via INTRAVENOUS

## 2017-09-06 MED ORDER — FENTANYL CITRATE (PF) 100 MCG/2ML IJ SOLN
25.0000 ug | INTRAMUSCULAR | Status: DC | PRN
  Administered 2017-09-06: 25 ug via INTRAVENOUS

## 2017-09-06 MED ORDER — METHOCARBAMOL 500 MG PO TABS
500.0000 mg | ORAL_TABLET | Freq: Four times a day (QID) | ORAL | Status: DC | PRN
  Administered 2017-09-06: 500 mg via ORAL
  Filled 2017-09-06: qty 1

## 2017-09-06 MED ORDER — DEXAMETHASONE SODIUM PHOSPHATE 10 MG/ML IJ SOLN
INTRAMUSCULAR | Status: AC
  Filled 2017-09-06: qty 1

## 2017-09-06 MED ORDER — HYDROCODONE-ACETAMINOPHEN 10-325 MG PO TABS
1.0000 | ORAL_TABLET | Freq: Four times a day (QID) | ORAL | Status: DC | PRN
  Administered 2017-09-06 – 2017-09-07 (×4): 2 via ORAL
  Filled 2017-09-06 (×4): qty 2

## 2017-09-06 MED ORDER — PHENYLEPHRINE HCL 10 MG/ML IJ SOLN
INTRAMUSCULAR | Status: DC | PRN
  Administered 2017-09-06: 20 ug/min via INTRAVENOUS

## 2017-09-06 MED ORDER — SUGAMMADEX SODIUM 200 MG/2ML IV SOLN
INTRAVENOUS | Status: AC
  Filled 2017-09-06: qty 2

## 2017-09-06 MED ORDER — MORPHINE SULFATE (PF) 4 MG/ML IV SOLN
2.0000 mg | INTRAVENOUS | Status: DC | PRN
  Filled 2017-09-06: qty 1

## 2017-09-06 MED ORDER — METOCLOPRAMIDE HCL 5 MG/ML IJ SOLN: 5.0000 mg | Freq: Three times a day (TID) | INTRAMUSCULAR | Status: DC | PRN

## 2017-09-06 MED ORDER — OXYCODONE HCL 5 MG PO TABS
5.0000 mg | ORAL_TABLET | ORAL | Status: DC | PRN
  Administered 2017-09-06 – 2017-09-08 (×9): 10 mg via ORAL
  Filled 2017-09-06 (×9): qty 2

## 2017-09-06 MED ORDER — ROCURONIUM BROMIDE 10 MG/ML (PF) SYRINGE
PREFILLED_SYRINGE | INTRAVENOUS | Status: AC
  Filled 2017-09-06: qty 5

## 2017-09-06 MED ORDER — 0.9 % SODIUM CHLORIDE (POUR BTL) OPTIME
TOPICAL | Status: DC | PRN
  Administered 2017-09-06: 1000 mL

## 2017-09-06 MED ORDER — DIPHENHYDRAMINE HCL 12.5 MG/5ML PO ELIX: 12.5000 mg | ORAL_SOLUTION | ORAL | Status: DC | PRN

## 2017-09-06 MED ORDER — LACTATED RINGERS IV SOLN
INTRAVENOUS | Status: DC | PRN
  Administered 2017-09-06: 11:00:00 via INTRAVENOUS

## 2017-09-06 MED ORDER — PROPOFOL 10 MG/ML IV BOLUS
INTRAVENOUS | Status: DC | PRN
  Administered 2017-09-06: 200 mg via INTRAVENOUS

## 2017-09-06 MED ORDER — MAGNESIUM CITRATE PO SOLN
1.0000 | Freq: Once | ORAL | Status: DC | PRN
Start: 2017-09-06 — End: 2017-09-08

## 2017-09-06 MED ORDER — PROPOFOL 10 MG/ML IV BOLUS
INTRAVENOUS | Status: AC
  Filled 2017-09-06: qty 20

## 2017-09-06 MED ORDER — QUETIAPINE FUMARATE 100 MG PO TABS
100.0000 mg | ORAL_TABLET | Freq: Every day | ORAL | Status: DC
  Administered 2017-09-06 – 2017-09-08 (×2): 100 mg via ORAL
  Filled 2017-09-06 (×2): qty 1

## 2017-09-06 MED ORDER — KETOROLAC TROMETHAMINE 15 MG/ML IJ SOLN
15.0000 mg | Freq: Four times a day (QID) | INTRAMUSCULAR | Status: AC
  Administered 2017-09-06 – 2017-09-07 (×4): 15 mg via INTRAVENOUS
  Filled 2017-09-06 (×4): qty 1

## 2017-09-06 MED ORDER — MIDAZOLAM HCL 2 MG/2ML IJ SOLN
INTRAMUSCULAR | Status: DC | PRN
  Administered 2017-09-06 (×2): 1 mg via INTRAVENOUS

## 2017-09-06 MED ORDER — METOCLOPRAMIDE HCL 10 MG PO TABS: 5.0000 mg | ORAL_TABLET | Freq: Three times a day (TID) | ORAL | Status: DC | PRN

## 2017-09-06 MED ORDER — ROCURONIUM BROMIDE 10 MG/ML (PF) SYRINGE
PREFILLED_SYRINGE | INTRAVENOUS | Status: DC | PRN
  Administered 2017-09-06 (×3): 10 mg via INTRAVENOUS
  Administered 2017-09-06: 60 mg via INTRAVENOUS
  Administered 2017-09-06: 10 mg via INTRAVENOUS

## 2017-09-06 MED ORDER — FENTANYL CITRATE (PF) 100 MCG/2ML IJ SOLN: 25.0000 ug | INTRAMUSCULAR | Status: DC | PRN

## 2017-09-06 MED ORDER — EPHEDRINE 5 MG/ML INJ
INTRAVENOUS | Status: AC
  Filled 2017-09-06: qty 10

## 2017-09-06 MED ORDER — METHOCARBAMOL 750 MG PO TABS
750.0000 mg | ORAL_TABLET | Freq: Four times a day (QID) | ORAL | Status: DC
  Administered 2017-09-06 – 2017-09-08 (×8): 750 mg via ORAL
  Filled 2017-09-06 (×8): qty 1

## 2017-09-06 MED ORDER — ACETAMINOPHEN 325 MG PO TABS
650.0000 mg | ORAL_TABLET | ORAL | Status: DC | PRN
Start: 2017-09-06 — End: 2017-09-08

## 2017-09-06 MED ORDER — LIDOCAINE 2% (20 MG/ML) 5 ML SYRINGE
INTRAMUSCULAR | Status: DC | PRN
  Administered 2017-09-06: 60 mg via INTRAVENOUS

## 2017-09-06 MED ORDER — FENTANYL CITRATE (PF) 250 MCG/5ML IJ SOLN
INTRAMUSCULAR | Status: DC | PRN
  Administered 2017-09-06: 100 ug via INTRAVENOUS

## 2017-09-06 MED ORDER — ENOXAPARIN SODIUM 40 MG/0.4ML ~~LOC~~ SOLN
40.0000 mg | SUBCUTANEOUS | Status: DC
  Administered 2017-09-07 – 2017-09-08 (×2): 40 mg via SUBCUTANEOUS
  Filled 2017-09-06 (×2): qty 0.4

## 2017-09-06 MED ORDER — BISACODYL 5 MG PO TBEC
5.0000 mg | DELAYED_RELEASE_TABLET | Freq: Every day | ORAL | Status: DC | PRN
  Filled 2017-09-06: qty 1

## 2017-09-06 MED ORDER — POLYETHYLENE GLYCOL 3350 17 G PO PACK: 17.0000 g | PACK | Freq: Every day | ORAL | Status: DC | PRN

## 2017-09-06 MED ORDER — MIDAZOLAM HCL 2 MG/2ML IJ SOLN
INTRAMUSCULAR | Status: AC
  Filled 2017-09-06: qty 2

## 2017-09-06 MED ORDER — OXYCODONE HCL 5 MG PO TABS
5.0000 mg | ORAL_TABLET | ORAL | Status: DC | PRN
  Administered 2017-09-06 (×2): 10 mg via ORAL
  Filled 2017-09-06 (×2): qty 2

## 2017-09-06 MED ORDER — EPINEPHRINE PF 1 MG/10ML IJ SOSY
PREFILLED_SYRINGE | INTRAMUSCULAR | Status: AC
  Filled 2017-09-06: qty 10

## 2017-09-06 MED ORDER — METHOCARBAMOL 1000 MG/10ML IJ SOLN
500.0000 mg | Freq: Four times a day (QID) | INTRAVENOUS | Status: DC | PRN
  Filled 2017-09-06: qty 5

## 2017-09-06 MED ORDER — FENTANYL CITRATE (PF) 100 MCG/2ML IJ SOLN
INTRAMUSCULAR | Status: AC
  Filled 2017-09-06: qty 2

## 2017-09-06 MED ORDER — POTASSIUM CHLORIDE IN NACL 20-0.9 MEQ/L-% IV SOLN
INTRAVENOUS | Status: DC
  Administered 2017-09-06: 16:00:00 via INTRAVENOUS
  Administered 2017-09-07: 75 mL/h via INTRAVENOUS
  Filled 2017-09-06 (×2): qty 1000

## 2017-09-06 MED ORDER — POLYETHYLENE GLYCOL 3350 17 G PO PACK
17.0000 g | PACK | Freq: Every day | ORAL | Status: DC
  Administered 2017-09-06: 17 g via ORAL
  Filled 2017-09-06 (×2): qty 1

## 2017-09-06 MED ORDER — ACETAMINOPHEN 650 MG RE SUPP: 650.0000 mg | RECTAL | Status: DC | PRN

## 2017-09-06 SURGICAL SUPPLY — 63 items
BANDAGE ACE 4X5 VEL STRL LF (GAUZE/BANDAGES/DRESSINGS) IMPLANT
BANDAGE ACE 6X5 VEL STRL LF (GAUZE/BANDAGES/DRESSINGS) ×2 IMPLANT
BIT DRILL 2.5X2.75 QC CALB (BIT) ×2 IMPLANT
BIT DRILL 3.8X6 NS (BIT) ×2 IMPLANT
BIT DRILL 4.4 NS (BIT) ×2 IMPLANT
BLADE SURG 10 STRL SS (BLADE) IMPLANT
BNDG COHESIVE 6X5 TAN STRL LF (GAUZE/BANDAGES/DRESSINGS) ×2 IMPLANT
BNDG GAUZE ELAST 4 BULKY (GAUZE/BANDAGES/DRESSINGS) ×4 IMPLANT
BRUSH SCRUB SURG 4.25 DISP (MISCELLANEOUS) ×8 IMPLANT
COVER SURGICAL LIGHT HANDLE (MISCELLANEOUS) ×2 IMPLANT
DRAPE C-ARM 42X72 X-RAY (DRAPES) ×2 IMPLANT
DRAPE C-ARMOR (DRAPES) ×2 IMPLANT
DRAPE HALF SHEET 40X57 (DRAPES) ×4 IMPLANT
DRAPE INCISE IOBAN 66X45 STRL (DRAPES) IMPLANT
DRAPE U-SHAPE 47X51 STRL (DRAPES) ×2 IMPLANT
DRSG ADAPTIC 3X8 NADH LF (GAUZE/BANDAGES/DRESSINGS) IMPLANT
DRSG MEPITEL 3X4 ME34 (GAUZE/BANDAGES/DRESSINGS) ×4 IMPLANT
ELECT REM PT RETURN 9FT ADLT (ELECTROSURGICAL) ×2
ELECTRODE REM PT RTRN 9FT ADLT (ELECTROSURGICAL) ×1 IMPLANT
GAUZE SPONGE 4X4 12PLY STRL (GAUZE/BANDAGES/DRESSINGS) IMPLANT
GLOVE BIO SURGEON STRL SZ 6 (GLOVE) ×4 IMPLANT
GLOVE BIO SURGEON STRL SZ7 (GLOVE) ×4 IMPLANT
GLOVE BIO SURGEON STRL SZ7.5 (GLOVE) ×4 IMPLANT
GLOVE BIO SURGEON STRL SZ8 (GLOVE) ×2 IMPLANT
GLOVE BIO SURGEON STRL SZ8.5 (GLOVE) ×2 IMPLANT
GLOVE BIOGEL PI IND STRL 6.5 (GLOVE) ×1 IMPLANT
GLOVE BIOGEL PI IND STRL 7.5 (GLOVE) ×1 IMPLANT
GLOVE BIOGEL PI IND STRL 8 (GLOVE) ×1 IMPLANT
GLOVE BIOGEL PI INDICATOR 6.5 (GLOVE) ×1
GLOVE BIOGEL PI INDICATOR 7.5 (GLOVE) ×1
GLOVE BIOGEL PI INDICATOR 8 (GLOVE) ×1
GOWN STRL REUS W/ TWL LRG LVL3 (GOWN DISPOSABLE) ×3 IMPLANT
GOWN STRL REUS W/ TWL XL LVL3 (GOWN DISPOSABLE) ×1 IMPLANT
GOWN STRL REUS W/TWL LRG LVL3 (GOWN DISPOSABLE) ×3
GOWN STRL REUS W/TWL XL LVL3 (GOWN DISPOSABLE) ×1
GUIDEWIRE BALL NOSE 80CM (WIRE) ×2 IMPLANT
KIT BASIN OR (CUSTOM PROCEDURE TRAY) ×2 IMPLANT
KIT ROOM TURNOVER OR (KITS) ×2 IMPLANT
NAIL TIBIAL 10MMX37.5CM (Nail) ×2 IMPLANT
NAIL TIBIAL 10X39M (Nail) ×2 IMPLANT
PACK ORTHO EXTREMITY (CUSTOM PROCEDURE TRAY) ×2 IMPLANT
PAD ABD 8X10 STRL (GAUZE/BANDAGES/DRESSINGS) ×4 IMPLANT
PAD ARMBOARD 7.5X6 YLW CONV (MISCELLANEOUS) ×4 IMPLANT
PAD CAST 4YDX4 CTTN HI CHSV (CAST SUPPLIES) ×1 IMPLANT
PADDING CAST COTTON 4X4 STRL (CAST SUPPLIES) ×1
PADDING CAST COTTON 6X4 STRL (CAST SUPPLIES) ×4 IMPLANT
SCREW ACECAP 44MM (Screw) ×2 IMPLANT
SCREW ACECAP 56MM (Screw) ×2 IMPLANT
SCREW CORTICAL 3.5MM 22MM (Screw) ×2 IMPLANT
SCREW CORTICAL 3.5MM 24MM (Screw) ×2 IMPLANT
SCREW PROXIMAL DEPUY (Screw) ×1 IMPLANT
SCREW PROXIMAL75MMLX5.5MM (Screw) ×2 IMPLANT
SCREW PRXML FT 65X5.5XNS CORT (Screw) ×1 IMPLANT
STAPLER VISISTAT 35W (STAPLE) ×2 IMPLANT
STRIP CLOSURE SKIN 1/2X4 (GAUZE/BANDAGES/DRESSINGS) IMPLANT
SUT ETHILON 3 0 PS 1 (SUTURE) ×2 IMPLANT
SUT VIC AB 0 CT1 27 (SUTURE) ×1
SUT VIC AB 0 CT1 27XBRD ANBCTR (SUTURE) ×1 IMPLANT
SUT VIC AB 2-0 CT1 27 (SUTURE) ×1
SUT VIC AB 2-0 CT1 TAPERPNT 27 (SUTURE) ×1 IMPLANT
TOWEL OR 17X24 6PK STRL BLUE (TOWEL DISPOSABLE) ×2 IMPLANT
TOWEL OR 17X26 10 PK STRL BLUE (TOWEL DISPOSABLE) ×2 IMPLANT
YANKAUER SUCT BULB TIP NO VENT (SUCTIONS) IMPLANT

## 2017-09-06 NOTE — Progress Notes (Signed)
Appreciate the kind referral of Dr. Jena GaussHaddix.   I have personally reviewed and discussed in detail with Dr. Jena GaussHaddix the patient's presentation, progress, confirmed the examination findings, and I have agreed with the plan for treatment outlined above.  Lot of movement, psychomotor agitation but A&O x 4 and conversant LLE Splint clean, dry, and in place  Sens DPN, SPN, TN intact  Motor EHL, ext, flex intact  DP 2+,  No significant edema  I discussed with the patient the risks and benefits of surgery, including the possibility of infection, nerve injury, vessel injury, wound breakdown, arthritis, symptomatic hardware, DVT/ PE, knee pain, loss of motion, malunion, nonunion, and need for further surgery among others.  We also specifically discussed the importance of compliance following surgery and potentially devastating effects if noncompliant.  He acknowledged these risks and wished to proceed.    Myrene GalasMichael Nikash Mortensen, MD [facility] [phone] (p)

## 2017-09-06 NOTE — ED Notes (Signed)
Pt removed self from cardiac monitor

## 2017-09-06 NOTE — Progress Notes (Signed)
Orthopedic Tech Progress Note Patient Details:  [name] [name] [id number] Applied ohf to bed. Ortho Devices Type of Ortho Device: Post (short leg) splint, Stirrup splint Ortho Device/Splint Location: lle plaster splints Ortho Device/Splint Interventions: Ordered, Application   Post Interventions Patient Tolerated: Well Instructions Provided: Care of device   Jennye MoccasinHughes, Jelani Trueba Craig 09/06/2017, 3:54 PM
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
## 2017-09-06 NOTE — Progress Notes (Signed)
RN informed Dr. Jena GaussHaddix that [facility] does not currently have any available telemetry boxes. Dr. Jena GaussHaddix verbally ordered discontinuation of telemetry monitoring and RN has entered order accordingly. Patient has refused IV fluids stating "I need to brush my teeth." RN discovered patients IV out of his arm during bedside report. RN placed an IV team consult for new IV placement. Patient successfully got out of bed earlier this morning and was at the sink with SCD and cord connected hopping on one leg using a front wheel walker with a urinal full of water stating "I need to brush my teeth." Patient has brushed his teeth multiple times each hour since admission. Patient may be drinking water given to him to brush his teeth as the patient also stated "I am so thirsty." and then repeatedly asks for containers of water to brush his teeth. In order to promote safety RN placed bed alarm on patients bed however, patient is still attempting to get out of bed. Nursing will continue to monitor.

## 2017-09-06 NOTE — Transfer of Care (Signed)
Immediate Anesthesia Transfer of Care Note  Patient: Sears J Stephanie  Procedure(s) Performed: INTRAMEDULLARY (IM) NAIL TIBIAL LEFT (Left )  Patient Location: PACU  Anesthesia Type:General  Level of Consciousness: drowsy and patient cooperative  Airway & Oxygen Therapy: Patient Spontanous Breathing and Patient connected to nasal cannula oxygen  Post-op Assessment: Report given to RN, Post -op Vital signs reviewed and stable and Patient moving all extremities X 4  Post vital signs: Reviewed and stable  Last Vitals:  Vitals:   09/06/17 0604 09/06/17 1338  BP: 119/63 96/60  Pulse: (!) 103 65  Resp: 20 15  Temp: 36.7 C   SpO2: 98% 100%    Last Pain:  Vitals:   09/06/17 0830  TempSrc:   PainSc: 0-No pain         Complications: No apparent anesthesia complications
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
## 2017-09-06 NOTE — Progress Notes (Signed)
Pt belongings originally checked into PACU, but pt's floor nurse here to pick up belongings. Made nurse aware of pocket knife.  Pt had also taken 4 surgical marking pens from short stay, removed from pt's procession.   MD made aware of above. Also made aware betadine nasal swab performed for positive staph aureus.

## 2017-09-06 NOTE — Anesthesia Procedure Notes (Signed)
Procedure Name: LMA Insertion Date/Time: 09/06/2017 10:41 AM Performed by: Burt Ekurner, [place] Zou Ashley, CRNA Pre-anesthesia Checklist: Patient identified, Emergency Drugs available, Suction available and Patient being monitored Patient Re-evaluated:Patient Re-evaluated prior to induction Oxygen Delivery Method: Circle system utilized Preoxygenation: Pre-oxygenation with 100% oxygen Induction Type: IV induction Ventilation: Mask ventilation without difficulty LMA: LMA inserted LMA Size: 5.0 Number of attempts: 1 Placement Confirmation: positive ETCO2 and breath sounds checked- equal and bilateral Tube secured with: Tape Dental Injury: Teeth and Oropharynx as per pre-operative assessment

## 2017-09-06 NOTE — Anesthesia Preprocedure Evaluation (Signed)
Anesthesia Evaluation  Patient identified by MRN, date of birth, ID band Patient awake    Reviewed: Allergy & Precautions, H&P , Patient's Chart, lab work & pertinent test results, reviewed documented beta blocker date and time   Airway Mallampati: II  TM Distance: >3 FB Neck ROM: full    Dental no notable dental hx.    Pulmonary Current Smoker,    Pulmonary exam normal breath sounds clear to auscultation       Cardiovascular Exercise Tolerance: Good  Rhythm:regular Rate:Normal     Neuro/Psych    GI/Hepatic   Endo/Other    Renal/GU      Musculoskeletal   Abdominal   Peds  Hematology   Anesthesia Other Findings   Reproductive/Obstetrics                             Anesthesia Physical Anesthesia Plan  ASA: II  Anesthesia Plan: General   Post-op Pain Management:    Induction: Intravenous  PONV Risk Score and Plan: 2 and Treatment may vary due to age or medical condition, Ondansetron and Dexamethasone  Airway Management Planned: LMA  Additional Equipment:   Intra-op Plan:   Post-operative Plan:   Informed Consent: I have reviewed the patients History and Physical, chart, labs and discussed the procedure including the risks, benefits and alternatives for the proposed anesthesia with the patient or authorized representative who has indicated his/her understanding and acceptance.   Dental Advisory Given  Plan Discussed with: CRNA and Surgeon  Anesthesia Plan Comments: ( )        Anesthesia Quick Evaluation

## 2017-09-06 NOTE — Progress Notes (Signed)
RN obtained pt belongings from Short Stay. Pt belongings in [phone] with the exception of pt's pocket knife, cigarettes, and lighter. These belongings are in a sealed bag at the secretary desk per [facility] Director orders to be returned at discharge.

## 2017-09-06 NOTE — Anesthesia Procedure Notes (Signed)
Procedure Name: Intubation Date/Time: 09/06/2017 10:51 AM Performed by: Julieta Bellini, CRNA Pre-anesthesia Checklist: Patient identified, Emergency Drugs available, Suction available and Patient being monitored Patient Re-evaluated:Patient Re-evaluated prior to induction Oxygen Delivery Method: Circle system utilized Preoxygenation: Pre-oxygenation with 100% oxygen Induction Type: IV induction Ventilation: Mask ventilation without difficulty Laryngoscope Size: Mac and 4 Grade View: Grade I Tube type: Oral Tube size: 7.5 mm Number of attempts: 1 Airway Equipment and Method: Stylet Placement Confirmation: ETT inserted through vocal cords under direct vision,  positive ETCO2 and breath sounds checked- equal and bilateral Secured at: 23 cm Tube secured with: Tape Dental Injury: Teeth and Oropharynx as per pre-operative assessment

## 2017-09-06 NOTE — Progress Notes (Signed)
RN informed pt that he has to leave all jewelry and belongings in room before going to short stay. Pt refused to leave his phone in the room and pt refused to take his shorts off. Pt states he is "out on bond" and has to make phone calls before surgery. RN informed pt to make these phone calls at 0700 during report.

## 2017-09-07 ENCOUNTER — Encounter (HOSPITAL_COMMUNITY): Payer: Self-pay | Admitting: Orthopedic Surgery

## 2017-09-07 DIAGNOSIS — F1510 Other stimulant abuse, uncomplicated: Secondary | ICD-10-CM

## 2017-09-07 DIAGNOSIS — F1210 Cannabis abuse, uncomplicated: Secondary | ICD-10-CM

## 2017-09-07 HISTORY — DX: Other stimulant abuse, uncomplicated: F15.10

## 2017-09-07 HISTORY — DX: Cannabis abuse, uncomplicated: F12.10

## 2017-09-07 LAB — CBC
HEMATOCRIT: 28.1 % — AB (ref 39.0–52.0)
HEMOGLOBIN: 9.4 g/dL — AB (ref 13.0–17.0)
MCH: 28.9 pg (ref 26.0–34.0)
MCHC: 33.5 g/dL (ref 30.0–36.0)
MCV: 86.5 fL (ref 78.0–100.0)
Platelets: 140 10*3/uL — ABNORMAL LOW (ref 150–400)
RBC: 3.25 MIL/uL — AB (ref 4.22–5.81)
RDW: 12.7 % (ref 11.5–15.5)
WBC: 6.6 10*3/uL (ref 4.0–10.5)

## 2017-09-07 LAB — RAPID URINE DRUG SCREEN, HOSP PERFORMED
Amphetamines: POSITIVE — AB
BARBITURATES: NOT DETECTED
Benzodiazepines: POSITIVE — AB
COCAINE: NOT DETECTED
Opiates: POSITIVE — AB
TETRAHYDROCANNABINOL: POSITIVE — AB
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
## 2017-09-07 NOTE — Anesthesia Postprocedure Evaluation (Signed)
Anesthesia Post Note  Patient: [name]  Procedure(s) Performed: INTRAMEDULLARY (IM) NAIL TIBIAL LEFT (Left )     Patient location during evaluation: PACU Anesthesia Type: General Level of consciousness: awake and alert Pain management: pain level controlled Vital Signs Assessment: post-procedure vital signs reviewed and stable Respiratory status: spontaneous breathing, nonlabored ventilation, respiratory function stable and patient connected to nasal cannula oxygen Cardiovascular status: blood pressure returned to baseline and stable Postop Assessment: no apparent nausea or vomiting Anesthetic complications: no    Last Vitals:  Vitals:   09/06/17 2132 09/07/17 0715  BP: 101/61 116/68  Pulse: 96 74  Resp: 16 16  Temp: 37 C 37.1 C  SpO2: 93% 98%    Last Pain:  Vitals:   09/07/17 1116  TempSrc:   PainSc: 7                  Johnasia Liese EDWARD

## 2017-09-07 NOTE — Progress Notes (Signed)
Patient refusing to wear SCDs. Educated patient on the benefits of wearing them. Will continue to monitor
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
## 2017-09-08 ENCOUNTER — Encounter (HOSPITAL_COMMUNITY): Payer: Self-pay | Admitting: Orthopedic Surgery

## 2017-09-08 DIAGNOSIS — B1920 Unspecified viral hepatitis C without hepatic coma: Secondary | ICD-10-CM | POA: Diagnosis present

## 2017-09-08 DIAGNOSIS — R768 Other specified abnormal immunological findings in serum: Secondary | ICD-10-CM

## 2017-09-08 HISTORY — DX: Other specified abnormal immunological findings in serum: R76.8

## 2017-09-08 LAB — CBC
HEMATOCRIT: 30.4 % — AB (ref 39.0–52.0)
HEMOGLOBIN: 10 g/dL — AB (ref 13.0–17.0)
MCH: 28.7 pg (ref 26.0–34.0)
MCHC: 32.9 g/dL (ref 30.0–36.0)
MCV: 87.4 fL (ref 78.0–100.0)
Platelets: 160 10*3/uL (ref 150–400)
RBC: 3.48 MIL/uL — AB (ref 4.22–5.81)
RDW: 12.9 % (ref 11.5–15.5)
WBC: 6 10*3/uL (ref 4.0–10.5)

## 2017-09-08 LAB — HEPATITIS PANEL, ACUTE
HEP A IGM: NEGATIVE
HEP B S AG: NEGATIVE
Hep B C IgM: NEGATIVE

## 2017-09-08 MED ORDER — ASPIRIN EC 325 MG PO TBEC: 325.0000 mg | DELAYED_RELEASE_TABLET | Freq: Every day | ORAL | 0 refills | Status: DC

## 2017-09-08 MED ORDER — HYDROCODONE-ACETAMINOPHEN 10-325 MG PO TABS: 1.0000 | ORAL_TABLET | Freq: Four times a day (QID) | ORAL | 0 refills | Status: DC | PRN

## 2017-09-08 MED ORDER — METHOCARBAMOL 750 MG PO TABS: 750.0000 mg | ORAL_TABLET | Freq: Four times a day (QID) | ORAL | 0 refills | Status: DC | PRN

## 2017-09-08 MED ORDER — DOCUSATE SODIUM 100 MG PO CAPS: 100.0000 mg | ORAL_CAPSULE | Freq: Every day | ORAL | 0 refills | Status: DC

## 2017-09-08 NOTE — Care Management (Signed)
[place], not agreeable to driving to [place] for appointment. CM unable to schedule PCP for followup, patient is uninsured, will have to locate free clinic in or near [place], not staying for this to be accomplished.
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
## 2017-09-08 NOTE — Progress Notes (Signed)
[name] to be D/C'd Home per MD order.  Discussed prescriptions and follow up appointments with the patient. Prescriptions given to patient, medication list explained in detail. Pt verbalized understanding.  Allergies as of 09/08/2017      Reactions   Tramadol       Medication List    TAKE these medications   aspirin EC 325 MG tablet Take 1 tablet (325 mg total) by mouth daily.   docusate sodium 100 MG capsule Commonly known as:  COLACE Take 1 capsule (100 mg total) by mouth daily.   HYDROcodone-acetaminophen 10-325 MG tablet Commonly known as:  NORCO Take 1-2 tablets by mouth every 6 (six) hours as needed for severe pain.   methocarbamol 750 MG tablet Commonly known as:  ROBAXIN Take 1 tablet (750 mg total) by mouth every 6 (six) hours as needed for muscle spasms.   QUEtiapine 100 MG tablet Commonly known as:  SEROQUEL Take 100 mg by mouth at bedtime.            Durable Medical Equipment  (From admission, onward)        Start     Ordered   09/08/17 1218  For home use only DME Crutches  Once     09/08/17 1217   09/08/17 1151  For home use only DME Walker rolling  Once    Question:  Patient needs a walker to treat with the following condition  Answer:  Closed displaced comminuted fracture of shaft of left tibia   09/08/17 1151       Discharge Care Instructions  (From admission, onward)        Start     Ordered   09/08/17 0000  Non weight bearing    Question Answer Comment  Laterality left   Extremity Lower      09/08/17 1216      Vitals:   09/07/17 2155 09/08/17 0547  BP: 112/72 104/60  Pulse: 94 96  Resp: 16 17  Temp: 98.8 F (37.1 C) 98.4 F (36.9 C)  SpO2: 93% 95%    Skin clean, dry and intact without evidence of skin break down, no evidence of skin tears noted. ACE wrap is applied to left lower leg, clean, dry, and intact. IV catheter discontinued intact. Site without signs and symptoms of complications. Dressing and pressure applied.  Pt denies pain at this time. No complaints noted.  An After Visit Summary and prescriptions were printed and given to the patient. Patient escorted via WC, and D/C home via private auto.  GrenadaBrittany Clarabelle Oscarson RN

## 2017-09-08 NOTE — Progress Notes (Signed)
Patient refused to wait for crutches to be delivered. Patient did leave with walker, signed for equipment.

## 2017-09-08 NOTE — Discharge Instructions (Signed)
[facility]edic [facility] Service Discharge Instructions   General Discharge Instructions  WEIGHT BEARING STATUS: Nonweightbearing Left Leg  RANGE OF MOTION/ACTIVITY: do not remove splint on left leg. Ok to be as active as possible while maintaining Weightbearing restrictions   Wound Care: DO NOT REMOVE SPLINT   DVT/PE prophylaxis: aspirin 325 mg daily x 4 weeks   Diet: as you were eating previously.  Can use over the counter stool softeners and bowel preparations, such as Miralax, to help with bowel movements.  Narcotics can be constipating.  Be sure to drink plenty of fluids  PAIN MEDICATION USE AND EXPECTATIONS  You have likely been given narcotic medications to help control your pain.  After a traumatic event that results in an fracture (broken bone) with or without surgery, it is ok to use narcotic pain medications to help control one's pain.  We understand that everyone responds to pain differently and each individual patient will be evaluated on a regular basis for the continued need for narcotic medications. Ideally, narcotic medication use should last no more than 6-8 weeks (coinciding with fracture healing).   As a patient it is your responsibility as well to monitor narcotic medication use and report the amount and frequency you use these medications when you come to your office visit.   We would also advise that if you are using narcotic medications, you should take a dose prior to therapy to maximize you participation.  IF YOU ARE ON NARCOTIC MEDICATIONS IT IS NOT PERMISSIBLE TO OPERATE A MOTOR VEHICLE (MOTORCYCLE/CAR/TRUCK/MOPED) OR HEAVY MACHINERY DO NOT MIX NARCOTICS WITH OTHER CNS (CENTRAL NERVOUS SYSTEM) DEPRESSANTS SUCH AS ALCOHOL   STOP SMOKING OR USING NICOTINE PRODUCTS!!!!  As discussed nicotine severely impairs your body's ability to heal surgical and traumatic wounds but also impairs bone healing.  Wounds and bone heal by forming microscopic blood vessels (angiogenesis) and  nicotine is a vasoconstrictor (essentially, shrinks blood vessels).  Therefore, if vasoconstriction occurs to these microscopic blood vessels they essentially disappear and are unable to deliver necessary nutrients to the healing tissue.  This is one modifiable factor that you can do to dramatically increase your chances of healing your injury.    (This means no smoking, no nicotine gum, patches, etc)  DO NOT USE NONSTEROIDAL ANTI-INFLAMMATORY DRUGS (NSAID'S)  Using products such as Advil (ibuprofen), Aleve (naproxen), Motrin (ibuprofen) for additional pain control during fracture healing can delay and/or prevent the healing response.  If you would like to take over the counter (OTC) medication, Tylenol (acetaminophen) is ok.  However, some narcotic medications that are given for pain control contain acetaminophen as well. Therefore, you should not exceed more than 4000 mg of tylenol in a day if you do not have liver disease.  Also note that there are may OTC medicines, such as cold medicines and allergy medicines that my contain tylenol as well.  If you have any questions about medications and/or interactions please ask your doctor/PA or your pharmacist.      ICE AND ELEVATE INJURED/OPERATIVE EXTREMITY  Using ice and elevating the injured extremity above your heart can help with swelling and pain control.  Icing in a pulsatile fashion, such as 20 minutes on and 20 minutes off, can be followed.    Do not place ice directly on skin. Make sure there is a barrier between to skin and the ice pack.    Using frozen items such as frozen peas works well as the conform nicely to the are that needs to be  iced.  USE AN ACE WRAP OR TED HOSE FOR SWELLING CONTROL  In addition to icing and elevation, Ace wraps or TED hose are used to help limit and resolve swelling.  It is recommended to use Ace wraps or TED hose until you are informed to stop.    When using Ace Wraps start the wrapping distally (farthest away from  the body) and wrap proximally (closer to the body)   Example: If you had surgery on your leg or thing and you do not have a splint on, start the ace wrap at the toes and work your way up to the thigh        If you had surgery on your upper extremity and do not have a splint on, start the ace wrap at your fingers and work your way up to the upper arm  IF YOU ARE IN A SPLINT OR CAST DO NOT REMOVE IT FOR ANY REASON   If your splint gets wet for any reason please contact the office immediately. You may shower in your splint or cast as long as you keep it dry.  This can be done by wrapping in a cast cover or garbage back (or similar)  Do Not stick any thing down your splint or cast such as pencils, money, or hangers to try and scratch yourself with.  If you feel itchy take benadryl as prescribed on the bottle for itching  IF YOU ARE IN A CAM BOOT (BLACK BOOT)  You may remove boot periodically. Perform daily dressing changes as noted below.  Wash the liner of the boot regularly and wear a sock when wearing the boot. It is recommended that you sleep in the boot until told otherwise  CALL THE OFFICE WITH ANY QUESTIONS OR CONCERNS: [phone]
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
## 2017-09-09 ENCOUNTER — Encounter: Payer: Self-pay | Admitting: Psychiatry

## 2017-09-09 LAB — CALCITRIOL (1,25 DI-OH VIT D): Vit D, 1,25-Dihydroxy: 62.2 pg/mL (ref 19.9–79.3)

## 2017-09-09 LAB — VITAMIN D 25 HYDROXY (VIT D DEFICIENCY, FRACTURES): Vit D, 25-Hydroxy: 33.3 ng/mL (ref 30.0–100.0)

## 2017-09-13 LAB — HCV RNA QUANT RFLX ULTRA OR GENOTYP
HCV RNA Qnt(log copy/mL): 6.826 log10 IU/mL
HepC Qn: 6700000 IU/mL

## 2017-11-11 ENCOUNTER — Other Ambulatory Visit: Payer: Self-pay

## 2017-11-11 ENCOUNTER — Emergency Department
Admission: EM | Admit: 2017-11-11 | Discharge: 2017-11-11 | Disposition: A | Discharge status: Home / Self Care | Payer: No Typology Code available for payment source | Attending: Emergency Medicine | Admitting: Emergency Medicine

## 2017-11-11 ENCOUNTER — Encounter: Payer: Self-pay | Admitting: Emergency Medicine

## 2017-11-11 DIAGNOSIS — F1910 Other psychoactive substance abuse, uncomplicated: Secondary | ICD-10-CM | POA: Insufficient documentation

## 2017-11-11 DIAGNOSIS — R45851 Suicidal ideations: Secondary | ICD-10-CM | POA: Insufficient documentation

## 2017-11-11 DIAGNOSIS — F1510 Other stimulant abuse, uncomplicated: Secondary | ICD-10-CM | POA: Diagnosis present

## 2017-11-11 DIAGNOSIS — F17210 Nicotine dependence, cigarettes, uncomplicated: Secondary | ICD-10-CM | POA: Insufficient documentation

## 2017-11-11 DIAGNOSIS — F3181 Bipolar II disorder: Secondary | ICD-10-CM | POA: Diagnosis present

## 2017-11-11 DIAGNOSIS — F1994 Other psychoactive substance use, unspecified with psychoactive substance-induced mood disorder: Secondary | ICD-10-CM | POA: Diagnosis not present

## 2017-11-11 DIAGNOSIS — Z79899 Other long term (current) drug therapy: Secondary | ICD-10-CM | POA: Insufficient documentation

## 2017-11-11 DIAGNOSIS — B1920 Unspecified viral hepatitis C without hepatic coma: Secondary | ICD-10-CM | POA: Diagnosis present

## 2017-11-11 DIAGNOSIS — Z7982 Long term (current) use of aspirin: Secondary | ICD-10-CM | POA: Insufficient documentation

## 2017-11-11 LAB — COMPREHENSIVE METABOLIC PANEL
ALBUMIN: 4.3 g/dL (ref 3.5–5.0)
ALT: 69 U/L — AB (ref 17–63)
AST: 48 U/L — AB (ref 15–41)
Alkaline Phosphatase: 93 U/L (ref 38–126)
Anion gap: 10 (ref 5–15)
BILIRUBIN TOTAL: 0.7 mg/dL (ref 0.3–1.2)
BUN: 6 mg/dL (ref 6–20)
CHLORIDE: 107 mmol/L (ref 101–111)
CO2: 25 mmol/L (ref 22–32)
CREATININE: 0.8 mg/dL (ref 0.61–1.24)
Calcium: 9.4 mg/dL (ref 8.9–10.3)
GFR calc Af Amer: >60 mL/min (ref >60)
GLUCOSE: 112 mg/dL — AB (ref 65–99)
POTASSIUM: 3.3 mmol/L — AB (ref 3.5–5.1)
Sodium: 142 mmol/L (ref 135–145)
TOTAL PROTEIN: 7.9 g/dL (ref 6.5–8.1)

## 2017-11-11 LAB — CBC
HCT: 40.1 % (ref 40.0–52.0)
Hemoglobin: 13.9 g/dL (ref 13.0–18.0)
MCH: 30 pg (ref 26.0–34.0)
MCHC: 34.7 g/dL (ref 32.0–36.0)
MCV: 86.4 fL (ref 80.0–100.0)
Platelets: 231 10*3/uL (ref 150–440)
RBC: 4.64 MIL/uL (ref 4.40–5.90)
RDW: 13.1 % (ref 11.5–14.5)
WBC: 6.6 10*3/uL (ref 3.8–10.6)

## 2017-11-11 LAB — SALICYLATE LEVEL: Salicylate Lvl: <7 mg/dL (ref 2.8–30.0)

## 2017-11-11 LAB — ACETAMINOPHEN LEVEL: Acetaminophen (Tylenol), Serum: <10 ug/mL — ABNORMAL LOW (ref 10–30)

## 2017-11-11 LAB — ETHANOL

## 2017-11-11 MED ORDER — ZIPRASIDONE MESYLATE 20 MG IM SOLR
20.0000 mg | Freq: Once | INTRAMUSCULAR | Status: AC
  Administered 2017-11-11: 20 mg via INTRAMUSCULAR
  Filled 2017-11-11: qty 20

## 2017-11-11 MED ORDER — QUETIAPINE FUMARATE 200 MG PO TABS: 200.0000 mg | ORAL_TABLET | Freq: Every day | ORAL | 1 refills | Status: DC

## 2017-11-11 NOTE — ED Notes (Signed)
IVC papers initiated by MD Cherre HugerGoodman/RN Ashley S. And ODS Officer made aware.

## 2017-11-11 NOTE — ED Triage Notes (Addendum)
Brought in via ems   States he has been using meth for the past 36 hours   Last use was last pm  Wishing to get help very restless  When asked about si /hi  He is unsure  Answers are jumbled

## 2017-11-11 NOTE — ED Notes (Signed)
IVC  RESCINDED PER  DR  CLAPACS MD  INFORMED  LUANN ([facility]  NURSE)
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
## 2017-11-11 NOTE — ED Notes (Signed)
Report received - pt seen by psychiatry

## 2017-11-11 NOTE — ED Notes (Addendum)
FIRST NURSE NOTE: Pt has been using meth for the last 36 hours, pt seeking "help" for meth use. VSS with EMS. Pt picked up by EMS from a friend's house.  Pt appears fidgety, sitting in wheelchair.

## 2017-11-11 NOTE — ED Notes (Signed)
Pt restless, continues to get up in attempt to leave.  Pt redirected per this RN on multiple occassions.  Two [employer] officers with patient in direct view.

## 2017-11-11 NOTE — ED Notes (Signed)
Per EDP, no suicide sitter needed at this time.

## 2017-11-11 NOTE — ED Notes (Signed)
CSW contacted for transportation and shelter information.  Mother of patient is unwilling to allow him back home.

## 2017-11-11 NOTE — Discharge Instructions (Signed)
Please seek medical attention and help for any thoughts about wanting to harm yourself, harm others, any concerning change in behavior, severe depression, inappropriate drug use or any other new or concerning symptoms. ° °

## 2017-11-11 NOTE — Clinical Social Work Note (Signed)
CSW received verbal consult from RN Luann for homelessness and transportation needs. Patient presented to ED after using meth for the last 36 hours. CSW met with patient in dayroom. CSW introduced self and explained Social Work role. Patient very fidgety, but engaged. Patient from home in [place], [place] with mother, but mother not allowing patient to return home. Patient has no funds to pay for transport and no friends or family to pick him up. Patient states he does not have an ongoing PCP, but is connected with [facility] for [facility]. CSW provided patient with shelter resources, [place] information/application, and bus pass (given to security). Patient appreciative for resources. Patient provided additional mental health and substance abuse resources by RN. CSW signing off as no further Social Work needs identified.   Oretha Ellis, Latanya Presser, [place] Social Worker-ED [phone]

## 2017-11-11 NOTE — ED Notes (Signed)
All f/u instructions given to pt and all belongings returned.  Bus pass given. Instructions to bus stop given.

## 2017-11-11 NOTE — Progress Notes (Signed)
[facility] accompanied by ED staff. Denies SI, HI, AVH and pain at this time. Per pt "I just need to get some prescriptions and go home". "I got depressed, took some meth and drank some beer, I felt like hurting myself, I'm good now". Per pt "I've been using meth about a year now". Emotional support and encouragement provided. Unit routines discussed. Safety checks initiated at Q 15 minutes intervals without self harm gestures or outburst.

## 2017-11-18 ENCOUNTER — Other Ambulatory Visit: Payer: Self-pay

## 2017-11-18 ENCOUNTER — Emergency Department
Admission: EM | Admit: 2017-11-18 | Discharge: 2017-11-18 | Disposition: A | Discharge status: Home / Self Care | Payer: Self-pay | Attending: Emergency Medicine | Admitting: Emergency Medicine

## 2017-11-18 DIAGNOSIS — Y929 Unspecified place or not applicable: Secondary | ICD-10-CM | POA: Insufficient documentation

## 2017-11-18 DIAGNOSIS — S61511A Laceration without foreign body of right wrist, initial encounter: Secondary | ICD-10-CM | POA: Insufficient documentation

## 2017-11-18 DIAGNOSIS — W268XXA Contact with other sharp object(s), not elsewhere classified, initial encounter: Secondary | ICD-10-CM | POA: Insufficient documentation

## 2017-11-18 DIAGNOSIS — Y93H3 Activity, building and construction: Secondary | ICD-10-CM | POA: Insufficient documentation

## 2017-11-18 DIAGNOSIS — Y999 Unspecified external cause status: Secondary | ICD-10-CM | POA: Insufficient documentation

## 2017-11-18 DIAGNOSIS — Z79899 Other long term (current) drug therapy: Secondary | ICD-10-CM | POA: Insufficient documentation

## 2017-11-18 DIAGNOSIS — Z7982 Long term (current) use of aspirin: Secondary | ICD-10-CM | POA: Insufficient documentation

## 2017-11-18 DIAGNOSIS — F17210 Nicotine dependence, cigarettes, uncomplicated: Secondary | ICD-10-CM | POA: Insufficient documentation

## 2017-11-18 MED ORDER — LIDOCAINE HCL (PF) 1 % IJ SOLN
30.0000 mL | Freq: Once | INTRAMUSCULAR | Status: DC
  Filled 2017-11-18: qty 30

## 2017-11-18 MED ORDER — LIDOCAINE HCL (PF) 1 % IJ SOLN
INTRAMUSCULAR | Status: AC
  Filled 2017-11-18: qty 20

## 2017-11-18 NOTE — Discharge Instructions (Signed)
The sutures will have to be removed in 10-14 days. Please seek medical attention for any high fevers, chest pain, shortness of breath, change in behavior, persistent vomiting, bloody stool or any other new or concerning symptoms.
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
## 2017-11-18 NOTE — ED Notes (Addendum)
Pt is worried about no home and not taking his medication. He is planning to call [facility] today and see how he can get some assistance. Pt to the waiting room where his mom or friend should be coming to get him. Pt given a sandwich tray and all he wanted was the banana and tomato. Pt washed his shoes and tank top in the sink and then signed discharge paperwork and proceeded to the waiting room

## 2017-11-18 NOTE — ED Notes (Signed)
Pt cut his hand while working on a roof today. Laceration to right outer aspect of wrist consistent with his story

## 2017-11-18 NOTE — ED Triage Notes (Addendum)
Pt was working on roof and fell off the ladder and he cut his right arm on tin. There is 2 inch lac noted to the medial aspect of the right hand right above the wrist.

## 2018-08-23 ENCOUNTER — Other Ambulatory Visit: Payer: Self-pay

## 2018-08-23 ENCOUNTER — Emergency Department
Admission: EM | Admit: 2018-08-23 | Discharge: 2018-08-24 | Disposition: A | Discharge status: Home / Self Care | Payer: Self-pay | Attending: Emergency Medicine | Admitting: Emergency Medicine

## 2018-08-23 ENCOUNTER — Encounter: Payer: Self-pay | Admitting: Emergency Medicine

## 2018-08-23 DIAGNOSIS — F17210 Nicotine dependence, cigarettes, uncomplicated: Secondary | ICD-10-CM | POA: Insufficient documentation

## 2018-08-23 DIAGNOSIS — Z7982 Long term (current) use of aspirin: Secondary | ICD-10-CM | POA: Insufficient documentation

## 2018-08-23 DIAGNOSIS — F329 Major depressive disorder, single episode, unspecified: Secondary | ICD-10-CM | POA: Insufficient documentation

## 2018-08-23 DIAGNOSIS — F1910 Other psychoactive substance abuse, uncomplicated: Secondary | ICD-10-CM

## 2018-08-23 DIAGNOSIS — B1920 Unspecified viral hepatitis C without hepatic coma: Secondary | ICD-10-CM | POA: Diagnosis present

## 2018-08-23 DIAGNOSIS — F29 Unspecified psychosis not due to a substance or known physiological condition: Secondary | ICD-10-CM | POA: Insufficient documentation

## 2018-08-23 DIAGNOSIS — F1510 Other stimulant abuse, uncomplicated: Secondary | ICD-10-CM | POA: Insufficient documentation

## 2018-08-23 DIAGNOSIS — F1290 Cannabis use, unspecified, uncomplicated: Secondary | ICD-10-CM | POA: Insufficient documentation

## 2018-08-23 DIAGNOSIS — Z79899 Other long term (current) drug therapy: Secondary | ICD-10-CM | POA: Insufficient documentation

## 2018-08-23 DIAGNOSIS — F1994 Other psychoactive substance use, unspecified with psychoactive substance-induced mood disorder: Secondary | ICD-10-CM | POA: Diagnosis present

## 2018-08-23 DIAGNOSIS — F15951 Other stimulant use, unspecified with stimulant-induced psychotic disorder with hallucinations: Secondary | ICD-10-CM

## 2018-08-23 MED ORDER — LORAZEPAM 2 MG/ML IJ SOLN
1.0000 mg | Freq: Once | INTRAMUSCULAR | Status: DC
  Filled 2018-08-23: qty 1

## 2018-08-23 MED ORDER — LORAZEPAM 2 MG/ML IJ SOLN
2.0000 mg | Freq: Once | INTRAMUSCULAR | Status: AC
  Administered 2018-08-23: 2 mg via INTRAMUSCULAR

## 2018-08-23 MED ORDER — ZIPRASIDONE MESYLATE 20 MG IM SOLR
20.0000 mg | Freq: Once | INTRAMUSCULAR | Status: AC
  Administered 2018-08-23: 20 mg via INTRAMUSCULAR
  Filled 2018-08-23: qty 20

## 2018-08-23 MED ORDER — DIPHENHYDRAMINE HCL 50 MG/ML IJ SOLN
50.0000 mg | Freq: Once | INTRAMUSCULAR | Status: DC
  Filled 2018-08-23: qty 1

## 2018-08-23 MED ORDER — DIPHENHYDRAMINE HCL 50 MG/ML IJ SOLN
50.0000 mg | Freq: Once | INTRAMUSCULAR | Status: AC
  Administered 2018-08-23: 50 mg via INTRAMUSCULAR

## 2018-08-23 NOTE — ED Notes (Signed)
Injections administered as ordered   - pt screaming  "fuck - take me out of my misery  - are you gonna put a shot into my dick - don't cut it off - don't cut it off"   Attempts made to reassure him that he is safe here  Pt verbalizing explicits    Assessment incomplete due to ams    Continue to monitor

## 2018-08-23 NOTE — ED Notes (Signed)
BEHAVIORAL HEALTH ROUNDING Patient sleeping: Yes.   Patient alert and oriented: eyes closed  Appears to be asleep Behavior appropriate: Yes.  ; If no, describe:  Nutrition and fluids offered: Yes  Toileting and hygiene offered: sleeping Sitter present: q 15 minute observations and security monitoring Law enforcement present: yes  ODS 

## 2018-08-23 NOTE — ED Notes (Signed)
Hourly rounding reveals patient in room sleeping with eyes closed. No complaints, stable, in no acute distress. Q15 minute rounds and monitoring via Rover and Officer to continue.   

## 2018-08-23 NOTE — ED Notes (Signed)
Unable to obtain blood samples at this time due to erratic behavior

## 2018-08-23 NOTE — ED Notes (Addendum)
Two earrings and one ring placed in urine cup at this time   t-shirt, camo sweat pants, socks, and shoes in belongings bag

## 2018-08-23 NOTE — ED Notes (Signed)
BEHAVIORAL HEALTH ROUNDING Patient sleeping: No. Patient alert : yes Behavior appropriate:  Erratic behavior observed  .  ; If no, describe:  Nutrition and fluids offered: yes Toileting and hygiene offered: Yes  Sitter present: q15 minute observations and security monitoring Law enforcement present: Yes  ODS  ENVIRONMENTAL ASSESSMENT Potentially harmful objects out of patient reach: Yes.   Personal belongings secured: Yes.   Patient dressed in hospital provided attire only: Yes.   Plastic bags out of patient reach: Yes.   Patient care equipment (cords, cables, call bells, lines, and drains) shortened, removed, or accounted for: Yes.   Equipment and supplies removed from bottom of stretcher: Yes.   Potentially toxic materials out of patient reach: Yes.   Sharps container removed or out of patient reach: Yes.

## 2018-08-23 NOTE — ED Notes (Signed)
Hourly rounding reveals patient in room. No complaints, stable, in no acute distress. Q15 minute rounds and monitoring via Rover and Officer to continue.   

## 2018-08-23 NOTE — ED Notes (Signed)
FIRST NURSE NOTE:  Pt arrived voluntarily with [employer] officers in hand-cuffs, pt was picked up at [employer] after walking away from [facility].    Pt is not oriented at this time and is observed to be talking to someone who is not there.

## 2018-08-23 NOTE — BH Assessment (Signed)
TTS unable to complete assessment as pt was given an IM to ensure safety of self and others.  TTS will attempt to assess pt when pt is awake and alert.

## 2018-08-23 NOTE — ED Notes (Signed)
Report from Amy RN. Patient sleeping, respirations regular and unlabored. Q15 minute rounds and observation by Rover and Officer to continue.  

## 2018-08-23 NOTE — ED Triage Notes (Signed)
PT was found in [employer] parking lot and had public call for pt acting bizzare. PT stating that he has a rod in his leg. No abnormality noted. PT acting irratic, stating " im dying and going to hell". PT appears impaired. PT unable to provide information at this time due to AMS. PT voluntary at this time.

## 2018-08-23 NOTE — Consult Note (Signed)
  Psychiatry: Consult received.  Patient's chart reviewed.  42 year old man who came to the emergency room very agitated and required medication for sedation in order to maintain safety.  Patient currently knocked out and cannot be interviewed.  Patient has a history of mood symptoms and amphetamine abuse.  I will follow-up once he is awake.
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
## 2018-08-23 NOTE — ED Notes (Signed)
Pt sleeping at this time and has been covered with warm blanket

## 2018-08-23 NOTE — ED Triage Notes (Signed)
Pending TTS Consult, Attempted to consult with pt at 2120. Unable to assess at this time.

## 2018-08-24 DIAGNOSIS — F15951 Other stimulant use, unspecified with stimulant-induced psychotic disorder with hallucinations: Secondary | ICD-10-CM

## 2018-08-24 LAB — URINALYSIS, COMPLETE (UACMP) WITH MICROSCOPIC
Bilirubin Urine: NEGATIVE
GLUCOSE, UA: NEGATIVE mg/dL
Hgb urine dipstick: NEGATIVE
Ketones, ur: 5 mg/dL — AB
Leukocytes, UA: NEGATIVE
Nitrite: NEGATIVE
PH: 6 (ref 5.0–8.0)
PROTEIN: NEGATIVE mg/dL
Specific Gravity, Urine: 1.021 (ref 1.005–1.030)
Squamous Epithelial / LPF: NONE SEEN (ref 0–5)

## 2018-08-24 LAB — URINE DRUG SCREEN, QUALITATIVE (ARMC ONLY)
AMPHETAMINES, UR SCREEN: POSITIVE — AB
Barbiturates, Ur Screen: NOT DETECTED
Benzodiazepine, Ur Scrn: POSITIVE — AB
Cannabinoid 50 Ng, Ur ~~LOC~~: POSITIVE — AB
Cocaine Metabolite,Ur ~~LOC~~: NOT DETECTED
MDMA (Ecstasy)Ur Screen: NOT DETECTED
Methadone Scn, Ur: NOT DETECTED
Opiate, Ur Screen: NOT DETECTED
PHENCYCLIDINE (PCP) UR S: NOT DETECTED
Tricyclic, Ur Screen: NOT DETECTED

## 2018-08-24 LAB — CBC WITH DIFFERENTIAL/PLATELET
Abs Immature Granulocytes: 0.04 10*3/uL (ref 0.00–0.07)
BASOS PCT: 0 %
Basophils Absolute: 0 10*3/uL (ref 0.0–0.1)
EOS PCT: 3 %
Eosinophils Absolute: 0.2 10*3/uL (ref 0.0–0.5)
HCT: 42.8 % (ref 39.0–52.0)
Hemoglobin: 14.3 g/dL (ref 13.0–17.0)
Immature Granulocytes: 1 %
Lymphocytes Relative: 35 %
Lymphs Abs: 2.2 10*3/uL (ref 0.7–4.0)
MCH: 28.7 pg (ref 26.0–34.0)
MCHC: 33.4 g/dL (ref 30.0–36.0)
MCV: 85.8 fL (ref 80.0–100.0)
MONO ABS: 0.6 10*3/uL (ref 0.1–1.0)
Monocytes Relative: 9 %
Neutro Abs: 3.3 10*3/uL (ref 1.7–7.7)
Neutrophils Relative %: 52 %
PLATELETS: 297 10*3/uL (ref 150–400)
RBC: 4.99 MIL/uL (ref 4.22–5.81)
RDW: 12.6 % (ref 11.5–15.5)
WBC: 6.3 10*3/uL (ref 4.0–10.5)
nRBC: 0 % (ref 0.0–0.2)

## 2018-08-24 LAB — COMPREHENSIVE METABOLIC PANEL
ALBUMIN: 4 g/dL (ref 3.5–5.0)
ALK PHOS: 55 U/L (ref 38–126)
ALT: 64 U/L — ABNORMAL HIGH (ref 0–44)
ANION GAP: 9 (ref 5–15)
AST: 61 U/L — AB (ref 15–41)
BILIRUBIN TOTAL: 0.5 mg/dL (ref 0.3–1.2)
BUN: 11 mg/dL (ref 6–20)
CALCIUM: 9.1 mg/dL (ref 8.9–10.3)
CO2: 27 mmol/L (ref 22–32)
Chloride: 105 mmol/L (ref 98–111)
Creatinine, Ser: 0.88 mg/dL (ref 0.61–1.24)
GFR calc Af Amer: >60 mL/min (ref >60)
GFR calc non Af Amer: >60 mL/min (ref >60)
GLUCOSE: 162 mg/dL — AB (ref 70–99)
POTASSIUM: 3.9 mmol/L (ref 3.5–5.1)
SODIUM: 141 mmol/L (ref 135–145)
TOTAL PROTEIN: 7.5 g/dL (ref 6.5–8.1)

## 2018-08-24 LAB — ETHANOL

## 2018-08-24 LAB — ACETAMINOPHEN LEVEL: Acetaminophen (Tylenol), Serum: <10 ug/mL — ABNORMAL LOW (ref 10–30)

## 2018-08-24 LAB — SALICYLATE LEVEL: Salicylate Lvl: <7 mg/dL (ref 2.8–30.0)

## 2018-08-24 NOTE — ED Notes (Signed)
Patient voices understanding of discharge instructions, all belongings given back to him, He left per self, he called His mom prior to leaving to attempt to get a ride to a friends house. Patient is nervous, and states it is from the drugs, but He does not want information regarding treatment centers.

## 2018-08-24 NOTE — ED Notes (Signed)
Hourly rounding reveals patient in room. No complaints, stable, in no acute distress. Q15 minute rounds and monitoring via Rover and Officer to continue.   

## 2018-08-24 NOTE — Consult Note (Signed)
[facility] Face-to-Face Psychiatry Consult   Reason for Consult: Consult for this 42 year old man with a history of substance abuse who came to the emergency room yesterday agitated and confused Referring Physician: Rip Harbour Patient Identification: [name] MRN:  [id number] Principal Diagnosis: Amphetamine and psychostimulant-induced psychosis with hallucinations ([place]) Diagnosis:  Principal Problem:   Amphetamine and psychostimulant-induced psychosis with hallucinations ([place]) Active Problems:   Hepatitis C   Amphetamine abuse ([place])   Substance induced mood disorder ([place])   Total Time spent with patient: 1 hour  Subjective:   [name] is a 42 y.o. male patient admitted with "I do not know".  HPI: Patient seen chart reviewed.  Patient has been knocked out for over a day after coming into the emergency room very agitated and apparently psychotic belligerent and disruptive.  He was medicated heavily and has slept overnight.  Today he is awake still only partially cooperative.  Patient says he thinks he walked himself over here to the emergency room.  He says he needs some "help".  When asked what he needs help with he will vaguely say "my ADHD my schizophrenia and all that she had".  Patient declines to actually discuss any particular symptom.  He admits that he has no place to live and has been going from one house to another staying with people.  Not currently working.  Daily use of methamphetamine and other drugs when he can get them.  Not drinking alcohol.  Patient is denying any suicidal thoughts.  Denies any hallucinations today and does not appear to be psychotic or paranoid.  Tells me that he really has no interest in stopping drugs because he likes them too much and does not want to go to any sort of treatment.  Medical history: Hepatitis C for which apparently he has never gotten any treatment.  Social history: Patient is estranged from all of his family.  Not currently working.   Moved from place to place and is essentially homeless.  Substance abuse history: Regular abuse of methamphetamine with a history also of abuse of other stimulants.  He has presented to the emergency room more than once with amphetamine psychosis.  Does not appear to have ever engaged in any substance abuse treatment despite specific referrals  Past Psychiatric History: Patient has been briefly diagnosed with Axis I disorder such as bipolar or psychosis in the past until he rapidly resolved when his drugs were off.  It looks to me much more clear from his past history that the primary problem is stimulant abuse.  No clear evidence that any kind of psychiatric treatment has helped.  Patient in particular says that Seroquel was useless and that other medicines he is been given have never done anything for him.  No history known of actual suicide attempts.  Risk to Self: Suicidal Ideation: No Suicidal Intent: No Is patient at risk for suicide?: No Suicidal Plan?: No Access to Means: No What has been your use of drugs/alcohol within the last 12 months?: "Meth, Xanax, Weed" How many times?: 0 Other Self Harm Risks: Active Addiction Triggers for Past Attempts: None known Intentional Self Injurious Behavior: None Risk to Others: Homicidal Ideation: No Thoughts of Harm to Others: No Current Homicidal Intent: No Current Homicidal Plan: No Access to Homicidal Means: No Identified Victim: None Reported History of harm to others?: No Assessment of Violence: None Noted Violent Behavior Description: None Noted Does patient have access to weapons?: No Criminal Charges Pending?: Yes Describe Pending Criminal Charges:  Driving while license revoked; expired registration Does patient have a court date: Yes Court Date: 09/06/18 Prior Inpatient Therapy: Prior Inpatient Therapy: Yes Prior Therapy Dates: Pt Unable to Recall Prior Therapy Facilty/Provider(s): [facility] Reason for Treatment: Pt unable to  recall Prior Outpatient Therapy: Prior Outpatient Therapy: Yes Prior Therapy Dates: Pt unable to recall Prior Therapy Facilty/Provider(s): [facility] Reason for Treatment: Substance Use Tx Does patient have an ACCT team?: No Does patient have Intensive In-House Services?  : No Does patient have [facility] services? : No Does patient have P4CC services?: No  Past Medical History:  Past Medical History:  Diagnosis Date  . Amphetamine abuse ([place]) 09/07/2017  . Anxiety   . Bipolar disorder ([place])   . Depression   . Drug abuse ([place])   . Hepatitis C   . Hepatitis C antibody positive in blood 09/08/2017  . Marijuana abuse 09/07/2017  . Sinus congestion     Past Surgical History:  Procedure Laterality Date  . ANKLE SURGERY    . HERNIA REPAIR    . HERNIA REPAIR    . TENDON REPAIR    . TIBIA IM NAIL INSERTION Left 09/06/2017   Procedure: INTRAMEDULLARY (IM) NAIL TIBIAL LEFT;  Surgeon: Altamese Switzer, MD;  Location: [facility];  Service: Orthopedics;  Laterality: Left;   Family History:  Family History  Problem Relation Age of Onset  . Heart failure Mother    Family Psychiatric  History: Unknown Social History:  Social History   Substance and Sexual Activity  Alcohol Use Yes   Comment: reports weekend use (fri/sat night)     Social History   Substance and Sexual Activity  Drug Use Yes  . Frequency: 3.0 times per week  . Types: Marijuana, Methamphetamines   Comment: Pt states that he last used meth yesterday morning     Social History   Socioeconomic History  . Marital status: Divorced    Spouse name: Not on file  . Number of children: Not on file  . Years of education: Not on file  . Highest education level: Not on file  Occupational History  . Not on file  Social Needs  . Financial resource strain: Not on file  . Food insecurity:    Worry: Not on file    Inability: Not on file  . Transportation needs:    Medical: Not on file    Non-medical: Not on file  Tobacco Use  .  Smoking status: Current Every Day Smoker    Packs/day: 1.00    Types: Cigarettes  . Smokeless tobacco: Current User  Substance and Sexual Activity  . Alcohol use: Yes    Comment: reports weekend use (fri/sat night)  . Drug use: Yes    Frequency: 3.0 times per week    Types: Marijuana, Methamphetamines    Comment: Pt states that he last used meth yesterday morning   . Sexual activity: Not on file  Lifestyle  . Physical activity:    Days per week: Not on file    Minutes per session: Not on file  . Stress: Not on file  Relationships  . Social connections:    Talks on phone: Not on file    Gets together: Not on file    Attends religious service: Not on file    Active member of club or organization: Not on file    Attends meetings of clubs or organizations: Not on file    Relationship status: Not on file  Other Topics Concern  . Not  on file  Social History Narrative   ** Merged History Encounter **       Additional Social History:    Allergies:   Allergies  Allergen Reactions  . Tramadol     Labs:  Results for orders placed or performed during the hospital encounter of 08/23/18 (from the past 48 hour(s))  Comprehensive metabolic panel     Status: Abnormal   Collection Time: 08/23/18 12:39 AM  Result Value Ref Range   Sodium 141 135 - 145 mmol/L   Potassium 3.9 3.5 - 5.1 mmol/L   Chloride 105 98 - 111 mmol/L   CO2 27 22 - 32 mmol/L   Glucose, Bld 162 (H) 70 - 99 mg/dL   BUN 11 6 - 20 mg/dL   Creatinine, Ser 0.88 0.61 - 1.24 mg/dL   Calcium 9.1 8.9 - 10.3 mg/dL   Total Protein 7.5 6.5 - 8.1 g/dL   Albumin 4.0 3.5 - 5.0 g/dL   AST 61 (H) 15 - 41 U/L   ALT 64 (H) 0 - 44 U/L   Alkaline Phosphatase 55 38 - 126 U/L   Total Bilirubin 0.5 0.3 - 1.2 mg/dL   GFR calc non Af Amer >60 >60 mL/min   GFR calc Af Amer >60 >60 mL/min    Comment: (NOTE) The eGFR has been calculated using the CKD EPI equation. This calculation has not been validated in all clinical  situations. eGFR's persistently <60 mL/min signify possible Chronic Kidney Disease.    Anion gap 9 5 - 15    Comment: Performed at [facility], [address] [place], [place] [address]  Ethanol     Status: None   Collection Time: 08/23/18 12:39 AM  Result Value Ref Range   Alcohol, Ethyl (B) <10 <10 mg/dL    Comment: (NOTE) Lowest detectable limit for serum alcohol is 10 mg/dL. For medical purposes only. Performed at [facility] [address] [place], [place] [address]   Acetaminophen level     Status: Abnormal   Collection Time: 08/23/18 12:39 AM  Result Value Ref Range   Acetaminophen (Tylenol), Serum <10 (L) 10 - 30 ug/mL    Comment: (NOTE) Therapeutic concentrations vary significantly. A range of 10-30 ug/mL  may be an effective concentration for many patients. However, some  are best treated at concentrations outside of this range. Acetaminophen concentrations >150 ug/mL at 4 hours after ingestion  and >50 ug/mL at 12 hours after ingestion are often associated with  toxic reactions. Performed at [facility], [address] [place], [place] [address]   Salicylate level     Status: None   Collection Time: 08/23/18 12:39 AM  Result Value Ref Range   Salicylate Lvl <2.4 2.8 - 30.0 mg/dL    Comment: Performed at [facility], [address] [place], [place] [address]  CBC with Differential     Status: None   Collection Time: 08/23/18 12:39 AM  Result Value Ref Range   WBC 6.3 4.0 - 10.5 K/uL   RBC 4.99 4.22 - 5.81 MIL/uL   Hemoglobin 14.3 13.0 - 17.0 g/dL   HCT 42.8 39.0 - 52.0 %   MCV 85.8 80.0 - 100.0 fL   MCH 28.7 26.0 - 34.0 pg   MCHC 33.4 30.0 - 36.0 g/dL   RDW 12.6 11.5 - 15.5 %   Platelets 297 150 - 400 K/uL   nRBC 0.0 0.0 - 0.2 %   Neutrophils Relative % 52 %  Neutro Abs 3.3 1.7 - 7.7 K/uL   Lymphocytes Relative 35 %   Lymphs Abs 2.2 0.7 - 4.0 K/uL   Monocytes Relative 9 %   Monocytes Absolute 0.6 0.1 -  1.0 K/uL   Eosinophils Relative 3 %   Eosinophils Absolute 0.2 0.0 - 0.5 K/uL   Basophils Relative 0 %   Basophils Absolute 0.0 0.0 - 0.1 K/uL   Immature Granulocytes 1 %   Abs Immature Granulocytes 0.04 0.00 - 0.07 K/uL    Comment: Performed at [facility], [address] [place], [place] [address]  Urinalysis, Complete w Microscopic     Status: Abnormal   Collection Time: 08/24/18  6:27 AM  Result Value Ref Range   Color, Urine YELLOW (A) YELLOW   APPearance CLOUDY (A) CLEAR   Specific Gravity, Urine 1.021 1.005 - 1.030   pH 6.0 5.0 - 8.0   Glucose, UA NEGATIVE NEGATIVE mg/dL   Hgb urine dipstick NEGATIVE NEGATIVE   Bilirubin Urine NEGATIVE NEGATIVE   Ketones, ur 5 (A) NEGATIVE mg/dL   Protein, ur NEGATIVE NEGATIVE mg/dL   Nitrite NEGATIVE NEGATIVE   Leukocytes, UA NEGATIVE NEGATIVE   RBC / HPF 0-5 0 - 5 RBC/hpf   WBC, UA 0-5 0 - 5 WBC/hpf   Bacteria, UA RARE (A) NONE SEEN   Squamous Epithelial / LPF NONE SEEN 0 - 5   Mucus PRESENT     Comment: Performed at [facility], [address] [place], [place] [address]  Urine Drug Screen, Qualitative     Status: Abnormal   Collection Time: 08/24/18  6:27 AM  Result Value Ref Range   Tricyclic, Ur Screen NONE DETECTED NONE DETECTED   Amphetamines, Ur Screen POSITIVE (A) NONE DETECTED   MDMA (Ecstasy)Ur Screen NONE DETECTED NONE DETECTED   Cocaine Metabolite,Ur [place] NONE DETECTED NONE DETECTED   Opiate, Ur Screen NONE DETECTED NONE DETECTED   Phencyclidine (PCP) Ur S NONE DETECTED NONE DETECTED   Cannabinoid 50 Ng, Ur [place] POSITIVE (A) NONE DETECTED   Barbiturates, Ur Screen NONE DETECTED NONE DETECTED   Benzodiazepine, Ur Scrn POSITIVE (A) NONE DETECTED   Methadone Scn, Ur NONE DETECTED NONE DETECTED    Comment: (NOTE) Tricyclics + metabolites, urine    Cutoff 1000 ng/mL Amphetamines + metabolites, urine  Cutoff 1000 ng/mL MDMA (Ecstasy), urine              Cutoff 500 ng/mL Cocaine Metabolite, urine           Cutoff 300 ng/mL Opiate + metabolites, urine        Cutoff 300 ng/mL Phencyclidine (PCP), urine         Cutoff 25 ng/mL Cannabinoid, urine                 Cutoff 50 ng/mL Barbiturates + metabolites, urine  Cutoff 200 ng/mL Benzodiazepine, urine              Cutoff 200 ng/mL Methadone, urine                   Cutoff 300 ng/mL The urine drug screen provides only a preliminary, unconfirmed analytical test result and should not be used for non-medical purposes. Clinical consideration and professional judgment should be applied to any positive drug screen result due to possible interfering substances. A more specific alternate chemical method must be used in order to obtain a confirmed analytical result. Gas chromatography / mass spectrometry (GC/MS) is the preferred confirmat ory method. Performed  at [facility], [address] [place], [place] [address]     No current facility-administered medications for this encounter.    Current Outpatient Medications  Medication Sig Dispense Refill  . aspirin EC 325 MG tablet Take 1 tablet (325 mg total) by mouth daily. 30 tablet 0  . QUEtiapine (SEROQUEL) 200 MG tablet Take 1 tablet (200 mg total) by mouth at bedtime. 30 tablet 1    Musculoskeletal: Strength & Muscle Tone: within normal limits Gait & Station: normal Patient leans: N/A  Psychiatric Specialty Exam: Physical Exam  Nursing note and vitals reviewed. Constitutional: He appears well-developed and well-nourished.  HENT:  Head: Normocephalic and atraumatic.  Eyes: Pupils are equal, round, and reactive to light. Conjunctivae are normal.  Neck: Normal range of motion.  Cardiovascular: Regular rhythm and normal heart sounds.  Respiratory: Effort normal. No respiratory distress.  GI: Soft.  Musculoskeletal: Normal range of motion.  Neurological: He is alert.  Skin: Skin is warm and dry.  Psychiatric: His affect is blunt. His speech is delayed. He is slowed. Thought  content is not paranoid. Cognition and memory are impaired. He expresses impulsivity. He expresses no homicidal and no suicidal ideation.    Review of Systems  Constitutional: Negative.   HENT: Negative.   Eyes: Negative.   Respiratory: Negative.   Cardiovascular: Negative.   Gastrointestinal: Negative.   Musculoskeletal: Negative.   Skin: Negative.   Neurological: Negative.   Psychiatric/Behavioral: Positive for substance abuse. Negative for depression, hallucinations, memory loss and suicidal ideas. The patient is nervous/anxious and has insomnia.     Blood pressure 112/75, pulse 85, temperature 98.3 F (36.8 C), temperature source Oral, resp. rate 18, SpO2 98 %.There is no height or weight on file to calculate BMI.  General Appearance: Disheveled  Eye Contact:  Minimal  Speech:  Slow  Volume:  Decreased  Mood:  Euthymic  Affect:  Constricted  Thought Process:  Coherent  Orientation:  Full (Time, Place, and Person)  Thought Content:  Logical  Suicidal Thoughts:  No  Homicidal Thoughts:  No  Memory:  Immediate;   Fair Recent;   Poor Remote;   Poor  Judgement:  Impaired  Insight:  Shallow  Psychomotor Activity:  Decreased  Concentration:  Concentration: Poor  Recall:  Poor  Fund of Knowledge:  Fair  Language:  Fair  Akathisia:  No  Handed:  Right  AIMS (if indicated):     Assets:  Physical Health  ADL's:  Impaired  Cognition:  Impaired,  Mild  Sleep:        Treatment Plan Summary: Plan Unfortunate young man appears to have once again presented to [place] with amphetamine induced psychosis.  He is now free of psychotic symptoms.  Grumpy and irritable but not suicidal and not acutely dangerous.  Not cooperative and specifically says he does not want any sort of substance abuse treatment.  Patient as always has been offered encouragement to go to [place] and is aware of the shelter in town and other possible arrangements for a place to stay.  Case reviewed with emergency room  doctor.  Discontinue IVC.  Patient can be released from the emergency room.  Disposition: No evidence of imminent risk to self or others at present.   Patient does not meet criteria for psychiatric inpatient admission. Supportive therapy provided about ongoing stressors. Discussed crisis plan, support from social network, calling 911, coming to the Emergency Department, and calling Suicide Hotline.  Alethia Berthold, MD 08/24/2018 2:10 PM

## 2018-08-24 NOTE — ED Notes (Signed)
Snack and beverage given. 

## 2018-08-24 NOTE — BH Assessment (Addendum)
Assessment Note  [name] is an 42 y.o. male who was transported to the ED by [employer] after pt was found in a [employer] parking lot displaying bizarre behaviors. Once pt arrived to ED he continued to display erratic behaviors. He became verbally aggressive with ED staff using explicit language and ultimately believing ED staff was going to harm him. At this time, pt was given an IM injection to ensure safety of self and others. When this writer assessed patient almost 24 hours later, he was unable to recall any of the events that resulted in this ED admission. Pt stated "I just remember walking down the road. When I got here they gave me a couple of shots and I went to sleep". He reports a long history of substance use, reporting he most recently used "meth, weed, and Xanax". He was unable to provide specific dates of last use. Pt denied SI/HI/AVH. He was not observed, at the time of TTS assessment, as having any psychotic behaviors. He was clear in speech and logical in his thought process. However, he was a poor historian and was not able to reports past inpatient admission or past prescribed medications.  He reports he has been homeless and couch-hopping for the past few months. He was unable to provide names of natural supports (family/friends). It appears that he has damaged a lot of relationships due to his substance use. He reports being open to seeking treatment for his substance use.  Diagnosis:  Amphetamine Use Disorder, Severe Cannabis Use Disorder, Severe Unspecified Depressive Disorder  Past Medical History:  Past Medical History:  Diagnosis Date  . 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 Anxiety   . Bipolar disorder (HCC)   . Depression   . Drug abuse (HCC)   . Hepatitis C   . Hepatitis C antibody positive in blood 09/08/2017  . Marijuana abuse 09/07/2017  . Sinus congestion     Past Surgical History:  Procedure Laterality Date  . ANKLE SURGERY    . HERNIA REPAIR    .  HERNIA REPAIR    . TENDON REPAIR    . TIBIA IM NAIL INSERTION Left 09/06/2017   Procedure: INTRAMEDULLARY (IM) NAIL TIBIAL LEFT;  Surgeon: Myrene Galas, MD;  Location: [facility] OR;  Service: Orthopedics;  Laterality: Left;    Family History:  Family History  Problem Relation Age of Onset  . Heart failure Mother     Social History:  reports that he has been smoking cigarettes. He has been smoking about 1.00 pack per day. He uses smokeless tobacco. He reports that he drinks alcohol. He reports that he has current or past drug history. Drugs: Marijuana and Methamphetamines. Frequency: 3.00 times per week.  Additional Social History:  Alcohol / Drug Use Pain Medications: See PTA Prescriptions: See PTA Over the Counter: See PTA History of alcohol / drug use?: Yes Longest period of sobriety (when/how long): "A year and a half..." Negative Consequences of Use: Personal relationships, Financial, Work / Programmer, multimedia, Armed forces operational officer Withdrawal Symptoms: Agitation, Sweats, Fever / Chills, Cramps, Nausea / Vomiting, Tremors, Diarrhea Substance #1 Name of Substance 1: "Crystal Meth" 1 - Age of First Use: Unable to Quantify 1 - Amount (size/oz): Unable to Quantify 1 - Frequency: Unable to Quantify 1 - Duration: Unable to Quantify 1 - Last Use / Amount: Unable to Quantify Substance #2 Name of Substance 2: Cannabis 2 - Age of First Use: Unable to Quantify 2 - Amount (size/oz): Unable to Quantify 2 - Frequency: Unable to  Quantify 2 - Duration: Unable to Quantify 2 - Last Use / Amount: Unable to Quantify Substance #3 Name of Substance 3: Benzodiazepines 3 - Age of First Use: Unable to Quantify 3 - Amount (size/oz): Unable to Quantify 3 - Frequency: Unable to Quantify 3 - Duration: Unable to Quantify 3 - Last Use / Amount: Unable to Quantify  CIWA: CIWA-Ar BP: 112/75 Pulse Rate: 85 COWS:    Allergies:  Allergies  Allergen Reactions  . Tramadol     Home Medications:  (Not in a hospital  admission)  OB/GYN Status:  No LMP for male patient.  General Assessment Data Assessment unable to be completed: Yes Reason for not completing assessment: Pt Sedated  [facility] ED TTS Assessment: In system Is this a Tele or Face-to-Face Assessment?: Face-to-Face Is this an Initial Assessment or a Re-assessment for this encounter?: Initial Assessment Patient Accompanied by:: N/A Language Other than English: No Living Arrangements: Homeless/Shelter What gender do you identify as?: Male Marital status: Single Maiden name: n/a Pregnancy Status: No Living Arrangements: Other (Comment)(Homesless) Can pt return to current living arrangement?: Yes Admission Status: Involuntary Petitioner: ED Attending Is patient capable of signing voluntary admission?: No Referral Source: Other([employer]) Insurance type: None  Medical Screening Exam [facility] Walk-in ONLY) Medical Exam completed: Yes  Crisis Care Plan Living Arrangements: Other (Comment)(Homesless) Legal Guardian: (Self) Name of Psychiatrist: None Reported Name of Therapist: None Reported  Education Status Is patient currently in school?: No Is the patient employed, unemployed or receiving disability?: Unemployed  Risk to self with the past 6 months Suicidal Ideation: No Has patient been a risk to self within the past 6 months prior to admission? : No Suicidal Intent: No Has patient had any suicidal intent within the past 6 months prior to admission? : No Is patient at risk for suicide?: No Suicidal Plan?: No Has patient had any suicidal plan within the past 6 months prior to admission? : No Access to Means: No What has been your use of drugs/alcohol within the last 12 months?: "Meth, Xanax, Weed" Previous Attempts/Gestures: No(Denied) How many times?: 0 Other Self Harm Risks: Active Addiction Triggers for Past Attempts: None known Intentional Self Injurious Behavior: None Family Suicide History: Unknown Recent  stressful life event(s): Other (Comment), Financial Problems(Drug Use; Homelessness) Persecutory voices/beliefs?: No Depression: Yes Depression Symptoms: Guilt, Feeling worthless/self pity Substance abuse history and/or treatment for substance abuse?: Yes Suicide prevention information given to non-admitted patients: Not applicable  Risk to Others within the past 6 months Homicidal Ideation: No Does patient have any lifetime risk of violence toward others beyond the six months prior to admission? : No Thoughts of Harm to Others: No Current Homicidal Intent: No Current Homicidal Plan: No Access to Homicidal Means: No Identified Victim: None Reported History of harm to others?: No Assessment of Violence: None Noted Violent Behavior Description: None Noted Does patient have access to weapons?: No Criminal Charges Pending?: Yes Describe Pending Criminal Charges: Driving while license revoked; expired registration Does patient have a court date: Yes Court Date: 09/06/18 Is patient on probation?: Yes(Parole)  Psychosis Hallucinations: None noted Delusions: None noted  Mental Status Report Appearance/Hygiene: In scrubs Eye Contact: Fair Motor Activity: Freedom of movement Speech: Logical/coherent Level of Consciousness: Alert Mood: Guilty Affect: Flat Anxiety Level: Minimal Thought Processes: Coherent, Relevant Judgement: Partial Orientation: Person, Place, Time, Situation, Appropriate for developmental age Obsessive Compulsive Thoughts/Behaviors: None  Cognitive Functioning Concentration: Normal Memory: Recent Impaired, Remote Impaired(Poor historian) Is patient IDD: No Insight: Fair  Impulse Control: Fair Appetite: Good Have you had any weight changes? : No Change Sleep: Decreased Total Hours of Sleep: 3 Vegetative Symptoms: None  ADLScreening [facility] Assessment Services) Patient's cognitive ability adequate to safely complete daily activities?: Yes Patient able to  express need for assistance with ADLs?: Yes Independently performs ADLs?: Yes (appropriate for developmental age)  Prior Inpatient Therapy Prior Inpatient Therapy: Yes Prior Therapy Dates: Pt Unable to Recall Prior Therapy Facilty/Provider(s): [facility] Reason for Treatment: Pt unable to recall  Prior Outpatient Therapy Prior Outpatient Therapy: Yes Prior Therapy Dates: Pt unable to recall Prior Therapy Facilty/Provider(s): [facility] Reason for Treatment: Substance Use Tx Does patient have an ACCT team?: No Does patient have Intensive In-House Services?  : No Does patient have [facility] services? : No Does patient have P4CC services?: No  ADL Screening (condition at time of admission) Patient's cognitive ability adequate to safely complete daily activities?: Yes Patient able to express need for assistance with ADLs?: Yes Independently performs ADLs?: Yes (appropriate for developmental age)       Abuse/Neglect Assessment (Assessment to be complete while patient is alone) Abuse/Neglect Assessment Can Be Completed: Yes Physical Abuse: Denies Verbal Abuse: Denies Sexual Abuse: Denies Exploitation of patient/patient's resources: Denies Self-Neglect: Denies Values / Beliefs Cultural Requests During Hospitalization: None Spiritual Requests During Hospitalization: None Consults Spiritual Care Consult Needed: No Social Work Consult Needed: No Merchant navy officer (For Healthcare) Does Patient Have a Medical Advance Directive?: No Would patient like information on creating a medical advance directive?: No - Patient declined Nutrition Screen- [facility] Adult/[facility]/AP Patient's home diet: Regular     Child/Adolescent Assessment Running Away Risk: (Patient is an adult)  Disposition:  Disposition Initial Assessment Completed for this Encounter: Yes Disposition of Patient: (Pending psych consult)  On Site Evaluation by:   Reviewed with Physician:    Wilmon Arms 08/24/2018 12:21 PM

## 2018-08-24 NOTE — ED Notes (Signed)
Patient alert and oriented, warm and dry, in no acute distress. Patient denies SI, HI, AVH and pain. Patient made aware of Q15 minute rounds and Rover and Officer presence for their safety. Patient instructed to come to me with needs or concerns.  

## 2018-08-24 NOTE — ED Notes (Signed)
Warm wash cloth given to patient to wash face and hands at this time. 

## 2018-08-24 NOTE — ED Provider Notes (Signed)
-----------------------------------------   8:03 AM on 08/24/2018 -----------------------------------------   Blood pressure (!) 157/77, pulse 77, temperature (!) 97.4 F (36.3 C), temperature source Oral, resp. rate 16, SpO2 98 %.  The patient had no acute events since last update.  Calm and cooperative at this time.  Disposition is pending Psychiatry/Behavioral Medicine team recommendations.     Arnaldo NatalMalinda, Benigna Delisi F, MD 08/24/18 [phone]

## 2018-08-24 NOTE — ED Notes (Signed)
Patient in shower at this time. 

## 2018-08-24 NOTE — Discharge Instructions (Addendum)
Please follow-up with [facility].  They may be on a help.  Return here for any further problems.

## 2018-08-24 NOTE — ED Notes (Signed)
Patient alert and oriented, nurse helped him remake His bed, He is cooperative, no signs of distress, denies Si/hi or avh.

## 2018-11-08 ENCOUNTER — Encounter: Payer: Self-pay | Admitting: Emergency Medicine

## 2018-11-08 ENCOUNTER — Other Ambulatory Visit: Payer: Self-pay

## 2018-11-08 ENCOUNTER — Emergency Department
Admission: EM | Admit: 2018-11-08 | Discharge: 2018-11-09 | Disposition: A | Discharge status: Home / Self Care | Payer: Self-pay | Attending: Emergency Medicine | Admitting: Emergency Medicine

## 2018-11-08 DIAGNOSIS — F17210 Nicotine dependence, cigarettes, uncomplicated: Secondary | ICD-10-CM | POA: Insufficient documentation

## 2018-11-08 DIAGNOSIS — Z7982 Long term (current) use of aspirin: Secondary | ICD-10-CM | POA: Insufficient documentation

## 2018-11-08 DIAGNOSIS — F1510 Other stimulant abuse, uncomplicated: Secondary | ICD-10-CM | POA: Insufficient documentation

## 2018-11-08 DIAGNOSIS — Z79899 Other long term (current) drug therapy: Secondary | ICD-10-CM | POA: Insufficient documentation

## 2018-11-08 DIAGNOSIS — F1910 Other psychoactive substance abuse, uncomplicated: Secondary | ICD-10-CM

## 2018-11-08 MED ORDER — LORAZEPAM 2 MG/ML IJ SOLN
2.0000 mg | Freq: Once | INTRAMUSCULAR | Status: AC
  Administered 2018-11-08: 2 mg via INTRAMUSCULAR
  Filled 2018-11-08: qty 1

## 2018-11-08 MED ORDER — DIPHENHYDRAMINE HCL 50 MG/ML IJ SOLN
50.0000 mg | Freq: Once | INTRAMUSCULAR | Status: AC
  Administered 2018-11-08: 50 mg via INTRAMUSCULAR
  Filled 2018-11-08: qty 1

## 2018-11-08 MED ORDER — HALOPERIDOL LACTATE 5 MG/ML IJ SOLN
5.0000 mg | Freq: Once | INTRAMUSCULAR | Status: AC
  Administered 2018-11-08: 5 mg via INTRAMUSCULAR
  Filled 2018-11-08: qty 1

## 2018-11-08 NOTE — ED Notes (Addendum)
Hourly rounding reveals patient in hall bed. No complaints, stable, in no acute distress. Q15 minute rounds to continue.

## 2018-11-08 NOTE — ED Notes (Signed)
Sent urine to lab

## 2018-11-08 NOTE — ED Notes (Signed)
Patient disruptive in lobby. Brought back to [facility]. Yelling out. Dr. Derrill Kay deescalated patient. Patient encouraged to rest till he is able to arrange transportation.

## 2018-11-08 NOTE — ED Notes (Signed)
Report to include Situation, Background, Assessment, and Recommendations received from Amy B. RN. Patient alert and oriented, warm and dry, in no acute distress. Patient "antsy" refuses to answer questions concerning SI, HI, AVH and pain. Patient made aware of Q15 minute rounds and Psychologist, counselling presence for their safety. Patient instructed to come to me with needs or concerns.

## 2018-11-08 NOTE — ED Notes (Signed)
Hourly rounding reveals patient in hall. No complaints, stable, in no acute distress. Q15 minute rounds and monitoring via Psychologist, counselling to continue.

## 2018-11-08 NOTE — Discharge Instructions (Addendum)
Please seek medical attention for any high fevers, chest pain, shortness of breath, change in behavior, persistent vomiting, bloody stool or any other new or concerning symptoms.  

## 2018-11-08 NOTE — ED Notes (Signed)
Patient stated he wanted to leave "I need a smoke".

## 2018-11-08 NOTE — ED Triage Notes (Addendum)
PT was dropped off by a friend, pt AMS, states used meth xfew days ago. PT is restless and fidgeting in triage. Unable to answer questions appropriately. PT states he needs something to calm down, pt feels like " I'm going a mile a minute", pt cursing in triage, unable to sit still  VSS

## 2018-11-08 NOTE — ED Notes (Signed)
Officer gave warm blanket, food tray, and juice.

## 2018-11-08 NOTE — ED Notes (Signed)
Hourly rounding reveals patient in room. No complaints, stable, in no acute distress. Q15 minute rounds and monitoring via Psychologist, counselling to continue. Sprite given.

## 2018-11-09 MED ORDER — QUETIAPINE FUMARATE 200 MG PO TABS: 200.0000 mg | ORAL_TABLET | Freq: Every day | ORAL | 3 refills | Status: DC

## 2018-11-09 NOTE — ED Notes (Signed)
Hourly rounding reveals patient sleeping in room. No complaints, stable, in no acute distress. Q15 minute rounds and monitoring via Rover and Officer to continue.  

## 2018-11-09 NOTE — ED Notes (Addendum)
Hourly rounding reveals patient asleep in hall bed. No complaints, stable, in no acute distress. Q15 minute rounds and monitoring via Rover and Officer to continue.  

## 2018-11-09 NOTE — ED Notes (Signed)
Hourly rounding reveals patient in hall bed. No complaints, stable, in no acute distress. Q15 minute rounds and monitoring via Rover and Officer to continue.  
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
## 2018-11-09 NOTE — ED Notes (Signed)
Hourly rounding reveals patient sleeping in hall bed. No complaints, stable, in no acute distress. Q15 minute rounds and monitoring via Rover and Officer to continue.  

## 2018-11-09 NOTE — ED Notes (Signed)
Pt given meal tray, refused hand hygiene prior to eating. Pt states he called for a ride and they should be on the way. Pt is alert and oriented at this time.

## 2018-11-09 NOTE — ED Notes (Signed)
NAD noted at time of D/C. Pt denies questions or concerns. Pt ambulatory to the lobby at this time with his friend.

## 2018-11-09 NOTE — ED Notes (Signed)
Patient back to 19 hall. Yelling and not responding to redirection.

## 2018-11-09 NOTE — ED Provider Notes (Signed)
Patient appears medically cleared for discharge.  He is not under involuntary commitment   Emily Filbert, MD 11/09/18 [phone]

## 2019-10-01 ENCOUNTER — Encounter: Payer: Self-pay | Admitting: Emergency Medicine

## 2019-10-01 ENCOUNTER — Emergency Department
Admission: EM | Admit: 2019-10-01 | Discharge: 2019-10-01 | Disposition: A | Discharge status: Home / Self Care | Payer: Self-pay | Attending: Emergency Medicine | Admitting: Emergency Medicine

## 2019-10-01 ENCOUNTER — Other Ambulatory Visit: Payer: Self-pay

## 2019-10-01 DIAGNOSIS — F1910 Other psychoactive substance abuse, uncomplicated: Secondary | ICD-10-CM | POA: Insufficient documentation

## 2019-10-01 DIAGNOSIS — F17210 Nicotine dependence, cigarettes, uncomplicated: Secondary | ICD-10-CM | POA: Insufficient documentation

## 2019-10-01 DIAGNOSIS — F1994 Other psychoactive substance use, unspecified with psychoactive substance-induced mood disorder: Secondary | ICD-10-CM

## 2019-10-01 DIAGNOSIS — F1510 Other stimulant abuse, uncomplicated: Secondary | ICD-10-CM | POA: Insufficient documentation

## 2019-10-01 DIAGNOSIS — F1520 Other stimulant dependence, uncomplicated: Secondary | ICD-10-CM | POA: Diagnosis present

## 2019-10-01 DIAGNOSIS — Z7982 Long term (current) use of aspirin: Secondary | ICD-10-CM | POA: Insufficient documentation

## 2019-10-01 DIAGNOSIS — F319 Bipolar disorder, unspecified: Secondary | ICD-10-CM | POA: Insufficient documentation

## 2019-10-01 DIAGNOSIS — F1210 Cannabis abuse, uncomplicated: Secondary | ICD-10-CM | POA: Insufficient documentation

## 2019-10-01 DIAGNOSIS — R45851 Suicidal ideations: Secondary | ICD-10-CM | POA: Insufficient documentation

## 2019-10-01 DIAGNOSIS — Z79899 Other long term (current) drug therapy: Secondary | ICD-10-CM | POA: Insufficient documentation

## 2019-10-01 DIAGNOSIS — F17228 Nicotine dependence, chewing tobacco, with other nicotine-induced disorders: Secondary | ICD-10-CM | POA: Insufficient documentation

## 2019-10-01 DIAGNOSIS — F39 Unspecified mood [affective] disorder: Secondary | ICD-10-CM | POA: Insufficient documentation

## 2019-10-01 LAB — COMPREHENSIVE METABOLIC PANEL
ALT: 85 U/L — ABNORMAL HIGH (ref 0–44)
AST: 75 U/L — ABNORMAL HIGH (ref 15–41)
Albumin: 4.9 g/dL (ref 3.5–5.0)
Alkaline Phosphatase: 54 U/L (ref 38–126)
Anion gap: 10 (ref 5–15)
BUN: 25 mg/dL — ABNORMAL HIGH (ref 6–20)
CO2: 26 mmol/L (ref 22–32)
Calcium: 9.8 mg/dL (ref 8.9–10.3)
Chloride: 100 mmol/L (ref 98–111)
Creatinine, Ser: 1.02 mg/dL (ref 0.61–1.24)
GFR calc Af Amer: >60 mL/min (ref >60)
GFR calc non Af Amer: >60 mL/min (ref >60)
Glucose, Bld: 94 mg/dL (ref 70–99)
Potassium: 4.4 mmol/L (ref 3.5–5.1)
Sodium: 136 mmol/L (ref 135–145)
Total Bilirubin: 1.3 mg/dL — ABNORMAL HIGH (ref 0.3–1.2)
Total Protein: 8.4 g/dL — ABNORMAL HIGH (ref 6.5–8.1)

## 2019-10-01 LAB — URINE DRUG SCREEN, QUALITATIVE (ARMC ONLY)
Amphetamines, Ur Screen: POSITIVE — AB
Barbiturates, Ur Screen: NOT DETECTED
Benzodiazepine, Ur Scrn: NOT DETECTED
Cannabinoid 50 Ng, Ur ~~LOC~~: POSITIVE — AB
Cocaine Metabolite,Ur ~~LOC~~: NOT DETECTED
MDMA (Ecstasy)Ur Screen: NOT DETECTED
Methadone Scn, Ur: NOT DETECTED
Opiate, Ur Screen: NOT DETECTED
Phencyclidine (PCP) Ur S: NOT DETECTED
Tricyclic, Ur Screen: NOT DETECTED

## 2019-10-01 LAB — CBC
HCT: 43.8 % (ref 39.0–52.0)
Hemoglobin: 15.3 g/dL (ref 13.0–17.0)
MCH: 29 pg (ref 26.0–34.0)
MCHC: 34.9 g/dL (ref 30.0–36.0)
MCV: 83 fL (ref 80.0–100.0)
Platelets: 290 10*3/uL (ref 150–400)
RBC: 5.28 MIL/uL (ref 4.22–5.81)
RDW: 13.1 % (ref 11.5–15.5)
WBC: 7.3 10*3/uL (ref 4.0–10.5)
nRBC: 0 % (ref 0.0–0.2)

## 2019-10-01 LAB — GLUCOSE, CAPILLARY: Glucose-Capillary: 99 mg/dL (ref 70–99)

## 2019-10-01 LAB — SALICYLATE LEVEL: Salicylate Lvl: <7 mg/dL — ABNORMAL LOW (ref 7.0–30.0)

## 2019-10-01 LAB — ETHANOL: Alcohol, Ethyl (B): <10 mg/dL (ref <10)

## 2019-10-01 LAB — ACETAMINOPHEN LEVEL: Acetaminophen (Tylenol), Serum: <10 ug/mL — ABNORMAL LOW (ref 10–30)

## 2019-10-01 MED ORDER — DROPERIDOL 2.5 MG/ML IJ SOLN
5.0000 mg | Freq: Once | INTRAMUSCULAR | Status: AC
  Administered 2019-10-01: 5 mg via INTRAMUSCULAR

## 2019-10-01 NOTE — BH Assessment (Signed)
Tele Assessment Note   Patient Name: [name] MRN: [id number] Referring Physician:  Location of Patient:  Location of Provider: Behavioral Health TTS Department  Ilia Abu Heavin is an 43 y.o. male.  Patient was in the ED previous due to drug overdose and was discharged; Pt got into an argument with his ride in the lobby and they left; pt was later readmitted for suicidal thoughts; pt stated he is currently homeless, however his mother lives in [place]; pt states he has been abusing drug for years and he wants help; pt states he last overdosed on a few weeks ago on meth, however he uses fentanyl, heroin, suboxone and opiate pills depending upon what drug he has access too; pt is actively suicidal stating he would use a knife to harm himself; pt stated he has been depressed for quite sometime; pt denies any HI or A/V hallucinations; pt stated his father was schizophrenic; pt denies family history related to substances; pt denies a history related to abuse;    Diagnosis:  Axis I: Mood Disorder Axis II: Deferred Axis III: see medical notes Axis IV: access to mental health and substance abuse services; housing   Past Medical History:  Past Medical History:  Diagnosis Date  . 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 Anxiety   . Bipolar disorder (HCC)   . Depression   . Drug abuse (HCC)   . Hepatitis C   . Hepatitis C antibody positive in blood 09/08/2017  . Marijuana abuse 09/07/2017  . Sinus congestion     Past Surgical History:  Procedure Laterality Date  . ANKLE SURGERY    . HERNIA REPAIR    . HERNIA REPAIR    . TENDON REPAIR    . TIBIA IM NAIL INSERTION Left 09/06/2017   Procedure: INTRAMEDULLARY (IM) NAIL TIBIAL LEFT;  Surgeon: Myrene Galas, MD;  Location: [facility] OR;  Service: Orthopedics;  Laterality: Left;    Family History:  Family History  Problem Relation Age of Onset  . Heart failure Mother     Social History:  reports that he has been smoking cigarettes. He has  been smoking about 1.00 pack per day. He uses smokeless tobacco. He reports current alcohol use. He reports current drug use. Frequency: 3.00 times per week. Drugs: Marijuana and Methamphetamines.  Additional Social History:     CIWA: CIWA-Ar BP: 112/75 Pulse Rate: 81 COWS:    Allergies:  Allergies  Allergen Reactions  . Tramadol     Home Medications: (Not in a hospital admission)   OB/GYN Status:  No LMP for male patient.  General Assessment Data Location of Assessment: [facility] ED TTS Assessment: In system Is this a Tele or Face-to-Face Assessment?: Face-to-Face Is this an Initial Assessment or a Re-assessment for this encounter?: Initial Assessment Patient Accompanied by:: N/A Language Other than English: No Living Arrangements: Homeless/Shelter What gender do you identify as?: Male Marital status: Single Living Arrangements: Other (Comment)(Homeless) Can pt return to current living arrangement?: Yes Admission Status: Voluntary Is patient capable of signing voluntary admission?: Yes Referral Source: Self/Family/Friend  Medical Screening Exam [facility] Walk-in ONLY) Medical Exam completed: Yes  Crisis Care Plan Living Arrangements: Other (Comment)(Homeless)     Risk to self with the past 6 months Suicidal Ideation: Yes-Currently Present Has patient been a risk to self within the past 6 months prior to admission? : No Suicidal Intent: Yes-Currently Present Has patient had any suicidal intent within the past 6 months prior to admission? : No Is patient at  risk for suicide?: Yes Suicidal Plan?: No(no current plan) Has patient had any suicidal plan within the past 6 months prior to admission? : No Access to Means: Yes Specify Access to Suicidal Means: household items (knives) What has been your use of drugs/alcohol within the last 12 months?: meth, heroin, opiates, fentanyl  Previous Attempts/Gestures: No How many times?: 0 Other Self Harm Risks: drug use Triggers for  Past Attempts: Unknown Intentional Self Injurious Behavior: Damaging Comment - Self Injurious Behavior: drug abuse Family Suicide History: No(Dad had schizophrenia) Recent stressful life event(s): Other (Comment)(argument with ride) Persecutory voices/beliefs?: No Depression: Yes Depression Symptoms: Feeling angry/irritable, Feeling worthless/self pity, Guilt Substance abuse history and/or treatment for substance abuse?: Yes Suicide prevention information given to non-admitted patients: Not applicable  Risk to Others within the past 6 months Homicidal Ideation: No Does patient have any lifetime risk of violence toward others beyond the six months prior to admission? : No Thoughts of Harm to Others: No Current Homicidal Intent: No Current Homicidal Plan: No Access to Homicidal Means: Yes Describe Access to Homicidal Means: household items Identified Victim: none History of harm to others?: No Assessment of Violence: None Noted Violent Behavior Description: cooperative Does patient have access to weapons?: Yes (Comment)(knives) Criminal Charges Pending?: No Does patient have a court date: No Is patient on probation?: No  Psychosis Hallucinations: None noted Delusions: None noted  Mental Status Report Appearance/Hygiene: Disheveled Eye Contact: Poor Motor Activity: Agitation, Restlessness Speech: Soft Level of Consciousness: Restless, Irritable Mood: Labile, Irritable Affect: Anxious, Irritable Anxiety Level: None Thought Processes: Coherent, Relevant Judgement: Impaired Orientation: Person, Place, Time, Situation Obsessive Compulsive Thoughts/Behaviors: None  Cognitive Functioning Concentration: Normal Memory: Recent Intact, Remote Intact Is patient IDD: No Insight: Poor Impulse Control: Poor Appetite: Fair Have you had any weight changes? : No Change Sleep: No Change Total Hours of Sleep: 6 Vegetative Symptoms: None  ADLScreening [facility] Assessment  Services) Patient's cognitive ability adequate to safely complete daily activities?: Yes Patient able to express need for assistance with ADLs?: Yes Independently performs ADLs?: Yes (appropriate for developmental age)  Prior Inpatient Therapy Prior Inpatient Therapy: Yes Prior Therapy Dates: 2020 Prior Therapy Facilty/Provider(s): [facility] Reason for Treatment: drug use  Prior Outpatient Therapy Prior Outpatient Therapy: No Does patient have an ACCT team?: No Does patient have Intensive In-House Services?  : No Does patient have [facility] services? : No Does patient have P4CC services?: No  ADL Screening (condition at time of admission) Patient's cognitive ability adequate to safely complete daily activities?: Yes Patient able to express need for assistance with ADLs?: Yes Independently performs ADLs?: Yes (appropriate for developmental age)             Regulatory affairs officer (For Healthcare) Does Patient Have a Medical Advance Directive?: No Would patient like information on creating a medical advance directive?: No - Patient declined          Disposition:  Disposition Initial Assessment Completed for this Encounter: Yes    Ma Rings, Cowgill 10/01/2019 2:04 PM

## 2019-10-01 NOTE — ED Notes (Signed)
Pt ambulating in er and calling for his ride loudly. Pt states he's ready to go- MD notified and pt to be discharged shortly. Pt provided water and directed to bathroom.

## 2019-10-01 NOTE — ED Notes (Signed)
Pt left without D/C paperwork and refused to sign discharge papers- refusal witness and documented on paper chart. Agricultural consultant notified.

## 2019-10-01 NOTE — ED Triage Notes (Signed)
See previous notes from today from [facility] and Dr. Ellender Hose.  Pt seen here for drug problem, was discharged and left without getting DC paperwork, pt states he get in an argument with his ride and states now he wants to kill himself.  Pt states he has overdosed on xanax before and states he recently overdosed a few weeks ago.  Pt reports no plan at this time, but states "I'll do it"  Poor eye contact in triage. Pt comes with several large bags of clothes and personal items.  Pt has be reminded to wear mask.

## 2019-10-01 NOTE — ED Notes (Signed)
Pt given meal tray.
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
## 2019-10-01 NOTE — ED Triage Notes (Signed)
First Nurse Note:  Arrives to ED lobby after discharge from room [place].  Patient throws himself down onto the ground and states "I need to be in the mental ward.  I am going to kill myself".  Placed in Triage area for safety.  Patient is calm and cooperative.  NAD

## 2019-10-01 NOTE — ED Provider Notes (Signed)
Assumed care from Dr. Karma Greaser at 7 AM. Briefly, the patient is a 43 y.o. male with PMHx of  has a past medical history of Amphetamine abuse ([place]) (09/07/2017), Anxiety, Bipolar disorder ([place]), Depression, Drug abuse ([place]), Hepatitis C, Hepatitis C antibody positive in blood (09/08/2017), Marijuana abuse (09/07/2017), and Sinus congestion. here with polysubstance abuse, acutely intoxicated.  No signs of trauma.  Hemodynamically stable.  Plan to monitor for sobriety and reassess..   Labs Reviewed  GLUCOSE, CAPILLARY  CBG MONITORING, ED    Course of Care: Patient now awake, ambulatory, tolerating p.o.  Denies any SI or HI.  He is arguing with someone on the phone in the hall.  He states he feels better and wants to leave.  No apparent acute emergent medical pathology.  Outpatient resources provided.     Duffy Bruce, MD 10/01/19 1125

## 2019-10-01 NOTE — BHH Counselor (Signed)
Patient interested in Inpatient Substance Abuse Treatment...  [facility] does not have any current beds open per Corrie Mckusick per Lauren patient does not meet criteria for inpatient detox services as his drug screen shows amphetamines

## 2019-10-01 NOTE — ED Provider Notes (Addendum)
Patient has been seen and evaluated by psychiatry team and deemed appropriate for discharge. Patient determined not to be a threat to themselves or others. Does not meet inpatient criteria at this time. High concern for malingering/seeking secondary gain. Patient is otherwise medically clear.   Will plan for discharge with outpatient follow up, provided resources.       Lilia Pro., MD 10/01/19 [phone]
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
## 2019-10-01 NOTE — Discharge Instructions (Addendum)
Thank you for letting us take care of you in the emergency department today.  Follow up with your doctor and/or therapist as soon as possible regarding today's ED visit.     Please contact [facility] for additional mental health/psychiatric assistance:  [address] [place], [place] [address] Phone:  [phone] or [phone]  Open Access:   Walk-in ASSESSMENT hours, M-W-F, 8:00am - 3:00pm Advanced Acess CRISIS:  M-F, 8:00am - 8:00pm Outpatient Services Office Hours:  M-F, 8:00am - 5:00pm  Please return to the emergency department if have any worsening symptoms, thoughts of wanting to hurt yourself or anyone else.

## 2019-10-01 NOTE — ED Notes (Signed)
TTS at bedside. 

## 2019-10-01 NOTE — ED Notes (Signed)
Report from Anna, RN
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
## 2019-10-01 NOTE — Consult Note (Signed)
[facility]   Reason for Consult:  Suicidal ideations Referring Physician:  EDP Patient Identification: Paulla Dollyommy Joe Kellogg MRN:  [id number] Principal Diagnosis: Substance induced mood disorder (HCC) Diagnosis:  Principal Problem:   Substance induced mood disorder (HCC) Active Problems:   Methamphetamine use disorder, severe (HCC)   Total Time spent with patient: 30 minutes  Subjective:   Paulla Dollyommy Joe Dicamillo is a 43 y.o. male patient is not very forthcoming with information.  Patient today stated that he is not discharging and then he did not get picked up in the emergency department.  Patient stated his mother was supposed to pick him up and when asked why she did not pick him up he stated "she is a bitch."  Patient reports that his biggest problem in his life is his substance abuse and that he has nowhere to go now.  Patient states he has not been to a residential rehab for substance abuse but that he is willing to go as long as they will be able to keep him there.  Patient then states that he would not be suicidal if he can go to a place like that.  When patient was asked if he was suicidal he stated "well I guess."  HPI:  EDP Notes:  5:03 AM: 43 y.o. male with a history of drug abuse including methamphetamine who presents by EMS for drug side effects.  History is limited but apparently he admitted to using Suboxone, meth, and marijuana.  He presents altered, scratching himself, twitching and thrashing around on the bed, extremely agitated, unable to remain still.  He is not able to provide a consistent or reliable history.  He is not complaining of any shortness of breath or chest pain or abdominal pain.  This was apparently a recreational use without an intention to hurt or kill himself. 11:20 AM: Assumed care from Dr. York CeriseForbach at 7 AM. Briefly, the patient is a 43 y.o. male with PMHx of  has a past medical history of Amphetamine abuse (HCC) (09/07/2017), Anxiety, Bipolar  disorder (HCC), Depression, Drug abuse (HCC), Hepatitis C, Hepatitis C antibody positive in blood (09/08/2017), Marijuana abuse (09/07/2017), and Sinus congestion. here with polysubstance abuse, acutely intoxicated.  No signs of trauma.  Hemodynamically stable.  Plan to monitor for sobriety and reassess..  12:19 pm: ADDENDUM: After being discharged, pt reportedly left and got into an argument with his ride. He was left in the waiting room. He now is reporting that he is suicidal. Please note that he was not suicidal prior to discharge, and was specifically asked. He remains adamant, however, and is requesting psych consult. I've let charge know and pt will be checked back in.  Patient is seen by this provider via face-to-face.  Patient was seen in the emergency department earlier this morning based on chart review and notes provided above from the EDP.  Patient was not suicidal until he was discharged and his ride left him here after an argument and per the patient it was his mother that came to pick him up.  Attempted to call patient's mother to find out exactly what happened when she arrived to pick him up but she did not answer the phone.  [facility] is attempting to find the patient placement in a residential rehab, however he has been turned down by 2 facilities so far which included [facility] and [facility].  Feel that the patient is making suicidal comments seeking secondary gain from the hospital.  Do not feel that  the patient meets inpatient criteria at this time.Discussed with Dr. Joan Mayans and plan is to discharge patient.  Past Psychiatric History: BIpolar disorder, reports various substances abused. Previous admission documented at [facility] in 2018  Risk to Self: Suicidal Ideation: Yes-Currently Present Suicidal Intent: Yes-Currently Present Is patient at risk for suicide?: Yes Suicidal Plan?: No(no current plan) Access to Means: Yes Specify Access to Suicidal Means: household items (knives) What has been your use of  drugs/alcohol within the last 12 months?: meth, heroin, opiates, fentanyl  How many times?: 0 Other Self Harm Risks: drug use Triggers for Past Attempts: Unknown Intentional Self Injurious Behavior: Damaging Comment - Self Injurious Behavior: drug abuse Risk to Others: Homicidal Ideation: No Thoughts of Harm to Others: No Current Homicidal Intent: No Current Homicidal Plan: No Access to Homicidal Means: Yes Describe Access to Homicidal Means: household items Identified Victim: none History of harm to others?: No Assessment of Violence: None Noted Violent Behavior Description: cooperative Does patient have access to weapons?: Yes (Comment)(knives) Criminal Charges Pending?: No Does patient have a court date: No Prior Inpatient Therapy: Prior Inpatient Therapy: Yes Prior Therapy Dates: 2020 Prior Therapy Facilty/Provider(s): [facility] Reason for Treatment: drug use Prior Outpatient Therapy: Prior Outpatient Therapy: No Does patient have an ACCT team?: No Does patient have Intensive In-House Services?  : No Does patient have [facility] services? : No Does patient have P4CC services?: No  Past Medical History:  Past Medical History:  Diagnosis Date  . Amphetamine abuse ([place]) 09/07/2017  . Anxiety   . Bipolar disorder ([place])   . Depression   . Drug abuse ([place])   . Hepatitis C   . Hepatitis C antibody positive in blood 09/08/2017  . Marijuana abuse 09/07/2017  . Sinus congestion     Past Surgical History:  Procedure Laterality Date  . ANKLE SURGERY    . HERNIA REPAIR    . HERNIA REPAIR    . TENDON REPAIR    . TIBIA IM NAIL INSERTION Left 09/06/2017   Procedure: INTRAMEDULLARY (IM) NAIL TIBIAL LEFT;  Surgeon: Altamese [place], MD;  Location: [facility];  Service: Orthopedics;  Laterality: Left;   Family History:  Family History  Problem Relation Age of Onset  . Heart failure Mother    Family Psychiatric  History: None reported Social History:  Social History   Substance and Sexual  Activity  Alcohol Use Yes   Comment: reports weekend use (fri/sat night)     Social History   Substance and Sexual Activity  Drug Use Yes  . Frequency: 3.0 times per week  . Types: Marijuana, Methamphetamines   Comment: Pt states that he last used meth yesterday morning     Social History   Socioeconomic History  . Marital status: Divorced    Spouse name: Not on file  . Number of children: Not on file  . Years of education: Not on file  . Highest education level: Not on file  Occupational History  . Not on file  Tobacco Use  . Smoking status: Current Every Day Smoker    Packs/day: 1.00    Types: Cigarettes  . Smokeless tobacco: Current User  Substance and Sexual Activity  . Alcohol use: Yes    Comment: reports weekend use (fri/sat night)  . Drug use: Yes    Frequency: 3.0 times per week    Types: Marijuana, Methamphetamines    Comment: Pt states that he last used meth yesterday morning   . Sexual activity: Not on file  Other  Topics Concern  . Not on file  Social History Narrative   ** Merged History Encounter **       Social Determinants of Health   Financial Resource Strain:   . Difficulty of Paying Living Expenses: Not on file  Food Insecurity:   . Worried About Programme researcher, broadcasting/film/video in the Last Year: Not on file  . Ran Out of Food in the Last Year: Not on file  Transportation Needs:   . Lack of Transportation (Medical): Not on file  . Lack of Transportation (Non-Medical): Not on file  Physical Activity:   . Days of Exercise per Week: Not on file  . Minutes of Exercise per Session: Not on file  Stress:   . Feeling of Stress : Not on file  Social Connections:   . Frequency of Communication with Friends and Family: Not on file  . Frequency of Social Gatherings with Friends and Family: Not on file  . Attends Religious Services: Not on file  . Active Member of Clubs or Organizations: Not on file  . Attends Banker Meetings: Not on file  . Marital  Status: Not on file   Additional Social History:    Allergies:   Allergies  Allergen Reactions  . Tramadol     Labs:  Results for orders placed or performed during the hospital encounter of 10/01/19 (from the past 48 hour(s))  Comprehensive metabolic panel     Status: Abnormal   Collection Time: 10/01/19  1:24 PM  Result Value Ref Range   Sodium 136 135 - 145 mmol/L   Potassium 4.4 3.5 - 5.1 mmol/L   Chloride 100 98 - 111 mmol/L   CO2 26 22 - 32 mmol/L   Glucose, Bld 94 70 - 99 mg/dL   BUN 25 (H) 6 - 20 mg/dL   Creatinine, Ser 1.19 0.61 - 1.24 mg/dL   Calcium 9.8 8.9 - 14.7 mg/dL   Total Protein 8.4 (H) 6.5 - 8.1 g/dL   Albumin 4.9 3.5 - 5.0 g/dL   AST 75 (H) 15 - 41 U/L   ALT 85 (H) 0 - 44 U/L   Alkaline Phosphatase 54 38 - 126 U/L   Total Bilirubin 1.3 (H) 0.3 - 1.2 mg/dL   GFR calc non Af Amer >60 >60 mL/min   GFR calc Af Amer >60 >60 mL/min   Anion gap 10 5 - 15    Comment: Performed at [facility], [address] [place], [place] [address]  Ethanol     Status: None   Collection Time: 10/01/19  1:24 PM  Result Value Ref Range   Alcohol, Ethyl (B) <10 <10 mg/dL    Comment: (NOTE) Lowest detectable limit for serum alcohol is 10 mg/dL. For medical purposes only. Performed at [facility], [address] [place], [place] [address]   Salicylate level     Status: Abnormal   Collection Time: 10/01/19  1:24 PM  Result Value Ref Range   Salicylate Lvl <7.0 (L) 7.0 - 30.0 mg/dL    Comment: Performed at [facility], [address] [place], [place] [address]  Acetaminophen level     Status: Abnormal   Collection Time: 10/01/19  1:24 PM  Result Value Ref Range   Acetaminophen (Tylenol), Serum <10 (L) 10 - 30 ug/mL    Comment: (NOTE) Therapeutic concentrations vary significantly. A range of 10-30 ug/mL  may be an effective concentration for many patients. However, some  are best treated at  concentrations outside of this  range. Acetaminophen concentrations >150 ug/mL at 4 hours after ingestion  and >50 ug/mL at 12 hours after ingestion are often associated with  toxic reactions. Performed at [facility], [address] [place], [place] [address]   cbc     Status: None   Collection Time: 10/01/19  1:24 PM  Result Value Ref Range   WBC 7.3 4.0 - 10.5 K/uL   RBC 5.28 4.22 - 5.81 MIL/uL   Hemoglobin 15.3 13.0 - 17.0 g/dL   HCT 92.1 19.4 - 17.4 %   MCV 83.0 80.0 - 100.0 fL   MCH 29.0 26.0 - 34.0 pg   MCHC 34.9 30.0 - 36.0 g/dL   RDW 08.1 44.8 - 18.5 %   Platelets 290 150 - 400 K/uL   nRBC 0.0 0.0 - 0.2 %    Comment: Performed at [facility], [address] [place], [place] [address]  Urine Drug Screen, Qualitative     Status: Abnormal   Collection Time: 10/01/19  1:24 PM  Result Value Ref Range   Tricyclic, Ur Screen NONE DETECTED NONE DETECTED   Amphetamines, Ur Screen POSITIVE (A) NONE DETECTED   MDMA (Ecstasy)Ur Screen NONE DETECTED NONE DETECTED   Cocaine Metabolite,Ur [place] NONE DETECTED NONE DETECTED   Opiate, Ur Screen NONE DETECTED NONE DETECTED   Phencyclidine (PCP) Ur S NONE DETECTED NONE DETECTED   Cannabinoid 50 Ng, Ur [place] POSITIVE (A) NONE DETECTED   Barbiturates, Ur Screen NONE DETECTED NONE DETECTED   Benzodiazepine, Ur Scrn NONE DETECTED NONE DETECTED   Methadone Scn, Ur NONE DETECTED NONE DETECTED    Comment: (NOTE) Tricyclics + metabolites, urine    Cutoff 1000 ng/mL Amphetamines + metabolites, urine  Cutoff 1000 ng/mL MDMA (Ecstasy), urine              Cutoff 500 ng/mL Cocaine Metabolite, urine          Cutoff 300 ng/mL Opiate + metabolites, urine        Cutoff 300 ng/mL Phencyclidine (PCP), urine         Cutoff 25 ng/mL Cannabinoid, urine                 Cutoff 50 ng/mL Barbiturates + metabolites, urine  Cutoff 200 ng/mL Benzodiazepine, urine              Cutoff 200 ng/mL Methadone, urine                   Cutoff 300 ng/mL The urine drug screen provides  only a preliminary, unconfirmed analytical test result and should not be used for non-medical purposes. Clinical consideration and professional judgment should be applied to any positive drug screen result due to possible interfering substances. A more specific alternate chemical method must be used in order to obtain a confirmed analytical result. Gas chromatography / mass spectrometry (GC/MS) is the preferred confirmat ory method. Performed at [facility], [address] [place], [place] [address]     No current facility-administered medications for this encounter.   Current Outpatient Medications  Medication Sig Dispense Refill  . aspirin EC 325 MG tablet Take 1 tablet (325 mg total) by mouth daily. 30 tablet 0  . QUEtiapine (SEROQUEL) 200 MG tablet Take 1 tablet (200 mg total) by mouth at bedtime. 30 tablet 3    Musculoskeletal: Strength & Muscle Tone: within normal limits Gait & Station: normal Patient leans: N/A  Psychiatric Specialty Exam: Physical Exam  Nursing  note and vitals reviewed. Constitutional: He is oriented to person, place, and time. He appears well-developed and well-nourished.  Cardiovascular: Normal rate.  Respiratory: Effort normal.  Musculoskeletal:        General: Normal range of motion.  Neurological: He is alert and oriented to person, place, and time.  Skin: Skin is warm.    Review of Systems  Constitutional: Negative.   HENT: Negative.   Eyes: Negative.   Respiratory: Negative.   Cardiovascular: Negative.   Gastrointestinal: Negative.   Genitourinary: Negative.   Musculoskeletal: Negative.   Skin: Negative.   Neurological: Negative.   Psychiatric/Behavioral: Positive for suicidal ideas (Only after he could not go home with his mother after previous discharge today).    Blood pressure 112/75, pulse 81, temperature 97.6 F (36.4 C), temperature source Oral, resp. rate 18, height  (1.854 m), weight 81.6 kg, SpO2 100 %.Body  mass index is 23.75 kg/m.  General Appearance: Casual  Eye Contact:  Minimal  Speech:  Clear and Coherent  Volume:  Decreased  Mood:  Depressed  Affect:  Congruent  Thought Process:  Coherent and Descriptions of Associations: Intact  Orientation:  Full (Time, Place, and Person)  Thought Content:  WDL  Suicidal Thoughts:  Only reports SI if he doesn't have a place to go that he can stay at  Homicidal Thoughts:  No  Memory:  Immediate;   Fair Recent;   Fair Remote;   Fair  Judgement:  Fair  Insight:  Good  Psychomotor Activity:  Normal  Concentration:  Concentration: Good  Recall:  Fair  Fund of Knowledge:  Fair  Language:  Fair  Akathisia:  No  Handed:  Right  AIMS (if indicated):     Assets:  Desire for Improvement Physical Health Resilience  ADL's:  Intact  Cognition:  WNL  Sleep:        Treatment Plan Summary: Discharge home  Follow up with outpatient resources with substance abuse treatment  Disposition: No evidence of imminent risk to self or others at present.   Patient does not meet criteria for psychiatric inpatient admission.  Gerlene Burdock Allianna Beaubien, FNP 10/01/2019 4:06 PM

## 2019-10-01 NOTE — ED Provider Notes (Signed)
ADDENDUM: After being discharged, pt reportedly left and got into an argument with his ride. He was left in the waiting room. He now is reporting that he is suicidal. Please note that he was not suicidal prior to discharge, and was specifically asked. He remains adamant, however, and is requesting psych consult. I've let charge know and pt will be checked back in.   Duffy Bruce, MD 10/01/19 1220

## 2019-10-01 NOTE — ED Notes (Signed)
Pt moves well in bed but states he is too tired to get up and walk.

## 2019-10-01 NOTE — ED Notes (Addendum)
Pt states that if he leaves he will kill himself. MD aware. Pt told multiple times that he has to go wait in the lobby and pt remains sitting in chair. Pt has belongings bags 4/4 in possession. Pt trying to call someone on his phone at this time. Pt also states that he has no where to go to and no one to come get him.

## 2019-10-01 NOTE — ED Notes (Signed)
Unable to complete entire triage note due to patients restlessness. Wells Guiles, RN informed and she will attempt to get vs when pt less restless.

## 2019-10-01 NOTE — ED Notes (Addendum)
Pt refuses vitals, signing, or taking paperwork. Explained that paperwork has resources for help. Pt d/c to lobby.

## 2019-11-17 ENCOUNTER — Emergency Department
Admission: EM | Admit: 2019-11-17 | Discharge: 2019-11-17 | Disposition: A | Discharge status: Home / Self Care | Payer: Self-pay | Attending: Emergency Medicine | Admitting: Emergency Medicine

## 2019-11-17 ENCOUNTER — Encounter: Payer: Self-pay | Admitting: Emergency Medicine

## 2019-11-17 ENCOUNTER — Other Ambulatory Visit: Payer: Self-pay

## 2019-11-17 DIAGNOSIS — F17210 Nicotine dependence, cigarettes, uncomplicated: Secondary | ICD-10-CM | POA: Insufficient documentation

## 2019-11-17 DIAGNOSIS — M79641 Pain in right hand: Secondary | ICD-10-CM | POA: Insufficient documentation

## 2019-11-17 DIAGNOSIS — F1510 Other stimulant abuse, uncomplicated: Secondary | ICD-10-CM | POA: Insufficient documentation

## 2019-11-17 DIAGNOSIS — F1910 Other psychoactive substance abuse, uncomplicated: Secondary | ICD-10-CM | POA: Insufficient documentation

## 2019-11-17 DIAGNOSIS — Z79899 Other long term (current) drug therapy: Secondary | ICD-10-CM | POA: Insufficient documentation

## 2019-11-17 DIAGNOSIS — F1210 Cannabis abuse, uncomplicated: Secondary | ICD-10-CM | POA: Insufficient documentation

## 2019-11-17 DIAGNOSIS — F17228 Nicotine dependence, chewing tobacco, with other nicotine-induced disorders: Secondary | ICD-10-CM | POA: Insufficient documentation

## 2019-11-17 NOTE — ED Triage Notes (Signed)
Pt presents to ED c/o swelling to R hand that he noticed today. Skin is cool. Pt denies injury. When asked if hand hurts pt says, "I don't really know." Pt appears jittery and distracted, unable to hold still. Questions require repeating multiple times to be answered.

## 2019-11-17 NOTE — ED Notes (Signed)
Pt discharged, but refusing to leave the ER. [employer] called to escort patient off the property.

## 2019-11-17 NOTE — ED Notes (Signed)
Unable to assess patient.  Patient was verbally aggressive with staff and EDP.
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
## 2019-11-17 NOTE — ED Notes (Signed)
Pt discharged and escorted out of the ER by [employer]. Pt left with all of his belongings. Not able to obtain VS or a signature.

## 2019-11-17 NOTE — Discharge Instructions (Addendum)
Please seek medical attention and help for any thoughts about wanting to harm yourself, harm others, any concerning change in behavior, severe depression, inappropriate drug use or any other new or concerning symptoms. ° °

## 2019-11-17 NOTE — ED Notes (Signed)
See triage note  Pt presents with right hand swelling  States he noticed this today  Unsure what happened to his hand  Pt is very jittery  Swearing a lot  States he thinks he is going crazy  Has black over night bag with him

## 2020-01-15 ENCOUNTER — Encounter: Payer: Self-pay | Admitting: Emergency Medicine

## 2020-01-15 ENCOUNTER — Emergency Department
Admission: EM | Admit: 2020-01-15 | Discharge: 2020-01-16 | Disposition: A | Discharge status: Other Acute Inpatient Hospital | Payer: Self-pay | Attending: Emergency Medicine | Admitting: Emergency Medicine

## 2020-01-15 ENCOUNTER — Other Ambulatory Visit: Payer: Self-pay

## 2020-01-15 DIAGNOSIS — Z20822 Contact with and (suspected) exposure to covid-19: Secondary | ICD-10-CM | POA: Insufficient documentation

## 2020-01-15 DIAGNOSIS — R110 Nausea: Secondary | ICD-10-CM | POA: Insufficient documentation

## 2020-01-15 DIAGNOSIS — F1510 Other stimulant abuse, uncomplicated: Secondary | ICD-10-CM | POA: Diagnosis present

## 2020-01-15 DIAGNOSIS — R45851 Suicidal ideations: Secondary | ICD-10-CM | POA: Insufficient documentation

## 2020-01-15 DIAGNOSIS — F1910 Other psychoactive substance abuse, uncomplicated: Secondary | ICD-10-CM

## 2020-01-15 DIAGNOSIS — F17200 Nicotine dependence, unspecified, uncomplicated: Secondary | ICD-10-CM | POA: Diagnosis present

## 2020-01-15 DIAGNOSIS — F15951 Other stimulant use, unspecified with stimulant-induced psychotic disorder with hallucinations: Secondary | ICD-10-CM | POA: Diagnosis present

## 2020-01-15 DIAGNOSIS — F15959 Other stimulant use, unspecified with stimulant-induced psychotic disorder, unspecified: Secondary | ICD-10-CM | POA: Insufficient documentation

## 2020-01-15 DIAGNOSIS — F1994 Other psychoactive substance use, unspecified with psychoactive substance-induced mood disorder: Secondary | ICD-10-CM | POA: Diagnosis present

## 2020-01-15 DIAGNOSIS — F3181 Bipolar II disorder: Secondary | ICD-10-CM | POA: Diagnosis present

## 2020-01-15 DIAGNOSIS — F1220 Cannabis dependence, uncomplicated: Secondary | ICD-10-CM | POA: Insufficient documentation

## 2020-01-15 DIAGNOSIS — F17210 Nicotine dependence, cigarettes, uncomplicated: Secondary | ICD-10-CM | POA: Insufficient documentation

## 2020-01-15 DIAGNOSIS — F319 Bipolar disorder, unspecified: Secondary | ICD-10-CM | POA: Insufficient documentation

## 2020-01-15 DIAGNOSIS — F1520 Other stimulant dependence, uncomplicated: Secondary | ICD-10-CM | POA: Diagnosis present

## 2020-01-15 DIAGNOSIS — N179 Acute kidney failure, unspecified: Secondary | ICD-10-CM | POA: Insufficient documentation

## 2020-01-15 LAB — CBC
HCT: 45.7 % (ref 39.0–52.0)
Hemoglobin: 15.8 g/dL (ref 13.0–17.0)
MCH: 30 pg (ref 26.0–34.0)
MCHC: 34.6 g/dL (ref 30.0–36.0)
MCV: 86.7 fL (ref 80.0–100.0)
Platelets: 303 10*3/uL (ref 150–400)
RBC: 5.27 MIL/uL (ref 4.22–5.81)
RDW: 12.2 % (ref 11.5–15.5)
WBC: 20.1 10*3/uL — ABNORMAL HIGH (ref 4.0–10.5)
nRBC: 0 % (ref 0.0–0.2)

## 2020-01-15 LAB — COMPREHENSIVE METABOLIC PANEL
ALT: 64 U/L — ABNORMAL HIGH (ref 0–44)
AST: 45 U/L — ABNORMAL HIGH (ref 15–41)
Albumin: 5.1 g/dL — ABNORMAL HIGH (ref 3.5–5.0)
Alkaline Phosphatase: 57 U/L (ref 38–126)
Anion gap: 14 (ref 5–15)
BUN: 15 mg/dL (ref 6–20)
CO2: 24 mmol/L (ref 22–32)
Calcium: 10.1 mg/dL (ref 8.9–10.3)
Chloride: 101 mmol/L (ref 98–111)
Creatinine, Ser: 2 mg/dL — ABNORMAL HIGH (ref 0.61–1.24)
GFR calc Af Amer: 46 mL/min — ABNORMAL LOW (ref >60)
GFR calc non Af Amer: 40 mL/min — ABNORMAL LOW (ref >60)
Glucose, Bld: 88 mg/dL (ref 70–99)
Potassium: 4.1 mmol/L (ref 3.5–5.1)
Sodium: 139 mmol/L (ref 135–145)
Total Bilirubin: 1.5 mg/dL — ABNORMAL HIGH (ref 0.3–1.2)
Total Protein: 8.2 g/dL — ABNORMAL HIGH (ref 6.5–8.1)

## 2020-01-15 LAB — BASIC METABOLIC PANEL
Anion gap: 8 (ref 5–15)
BUN: 18 mg/dL (ref 6–20)
CO2: 22 mmol/L (ref 22–32)
Calcium: 8.5 mg/dL — ABNORMAL LOW (ref 8.9–10.3)
Chloride: 106 mmol/L (ref 98–111)
Creatinine, Ser: 1.5 mg/dL — ABNORMAL HIGH (ref 0.61–1.24)
GFR calc Af Amer: >60 mL/min (ref >60)
GFR calc non Af Amer: 56 mL/min — ABNORMAL LOW (ref >60)
Glucose, Bld: 108 mg/dL — ABNORMAL HIGH (ref 70–99)
Potassium: 3.9 mmol/L (ref 3.5–5.1)
Sodium: 136 mmol/L (ref 135–145)

## 2020-01-15 LAB — LIPASE, BLOOD: Lipase: 24 U/L (ref 11–51)

## 2020-01-15 MED ORDER — SODIUM CHLORIDE 0.9 % IV BOLUS
1000.0000 mL | Freq: Once | INTRAVENOUS | Status: AC
  Administered 2020-01-15: 1000 mL via INTRAVENOUS

## 2020-01-15 MED ORDER — SODIUM CHLORIDE 0.9% FLUSH: 3.0000 mL | Freq: Once | INTRAVENOUS | Status: DC

## 2020-01-15 MED ORDER — LORAZEPAM 2 MG/ML IJ SOLN
1.0000 mg | Freq: Once | INTRAMUSCULAR | Status: AC
  Administered 2020-01-15: 1 mg via INTRAVENOUS
  Filled 2020-01-15: qty 1

## 2020-01-15 MED ORDER — LORAZEPAM 2 MG/ML IJ SOLN: 1.0000 mg | Freq: Once | INTRAMUSCULAR | Status: DC

## 2020-01-15 MED ORDER — ZIPRASIDONE MESYLATE 20 MG IM SOLR
20.0000 mg | Freq: Once | INTRAMUSCULAR | Status: AC
  Administered 2020-01-15: 20 mg via INTRAMUSCULAR
  Filled 2020-01-15: qty 20

## 2020-01-15 NOTE — ED Notes (Signed)
Patient is not compliant with medical orders, patient said he does not understand why he needs to be here. Patient continues to ask for food and to call his daughter but will not listen to staff, he keeps coming in and out of his room

## 2020-01-15 NOTE — ED Triage Notes (Addendum)
C/O nausea x 2-3 hours.  AAOx3.  Skin warm and dry. NAD  Patient agitated in Triage.  Frequently yelling out.  Unable to sit still.Marland Kitchen
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
## 2020-01-15 NOTE — ED Notes (Signed)
Pt given meal tray.

## 2020-01-15 NOTE — ED Notes (Signed)
This tech noticed pt was standing at [employer] with the bottom of the tv. This tech went in the rm and noticed pt had something balled up in his hands. When pt was asked what was in his hands, pt stated "nothing." I asked the pt to open his hands up and pt had the remote to the tv broken in half and had the back of the control trying to turn on the tv. Remote was shown to [place], Charity fundraiser and remote was discarded in the trash. All of remote pieces was gathered.

## 2020-01-15 NOTE — ED Notes (Signed)
IVC PENDING  CONSULT ?

## 2020-01-15 NOTE — ED Notes (Signed)
PT  PLACED  UNDER  IVC PAPERS  PER  DR  Roxan Hockey  MD  INFORMED  RN  JADEKA  AND    POLICE  OFFICER

## 2020-01-15 NOTE — ED Notes (Signed)
Patient had an IV placed in his left forearm and pulled it out of his arm, when asked why he did that patient said he didn't know, I didn't feel like having it in my arm.

## 2020-01-15 NOTE — ED Notes (Signed)
Pt dressed out into burgundy scrubs with this tech, Geralynn Ochs, Charity fundraiser and [employer] in the rm. Pt belongings consist of a pair of black socks and a pair of black shorts. Pt belongings placed into one pt belongings bag and labeled with pt name.

## 2020-01-16 ENCOUNTER — Other Ambulatory Visit: Payer: Self-pay

## 2020-01-16 ENCOUNTER — Encounter: Payer: Self-pay | Admitting: Behavioral Health

## 2020-01-16 ENCOUNTER — Inpatient Hospital Stay
Admission: AD | Admit: 2020-01-16 | Discharge: 2020-01-26 | DRG: 885 | Disposition: A | Discharge status: Home / Self Care | Payer: No Typology Code available for payment source | Source: Intra-hospital | Attending: Psychiatry | Admitting: Psychiatry

## 2020-01-16 DIAGNOSIS — F15251 Other stimulant dependence with stimulant-induced psychotic disorder with hallucinations: Secondary | ICD-10-CM | POA: Diagnosis present

## 2020-01-16 DIAGNOSIS — Z590 Homelessness: Secondary | ICD-10-CM | POA: Diagnosis not present

## 2020-01-16 DIAGNOSIS — Z818 Family history of other mental and behavioral disorders: Secondary | ICD-10-CM | POA: Diagnosis not present

## 2020-01-16 DIAGNOSIS — B1920 Unspecified viral hepatitis C without hepatic coma: Secondary | ICD-10-CM | POA: Diagnosis present

## 2020-01-16 DIAGNOSIS — Z8249 Family history of ischemic heart disease and other diseases of the circulatory system: Secondary | ICD-10-CM | POA: Diagnosis not present

## 2020-01-16 DIAGNOSIS — Z885 Allergy status to narcotic agent status: Secondary | ICD-10-CM

## 2020-01-16 DIAGNOSIS — F1510 Other stimulant abuse, uncomplicated: Secondary | ICD-10-CM | POA: Diagnosis present

## 2020-01-16 DIAGNOSIS — F909 Attention-deficit hyperactivity disorder, unspecified type: Secondary | ICD-10-CM | POA: Diagnosis present

## 2020-01-16 DIAGNOSIS — F1910 Other psychoactive substance abuse, uncomplicated: Secondary | ICD-10-CM | POA: Diagnosis present

## 2020-01-16 DIAGNOSIS — F15951 Other stimulant use, unspecified with stimulant-induced psychotic disorder with hallucinations: Secondary | ICD-10-CM | POA: Diagnosis not present

## 2020-01-16 DIAGNOSIS — F17210 Nicotine dependence, cigarettes, uncomplicated: Secondary | ICD-10-CM | POA: Diagnosis present

## 2020-01-16 DIAGNOSIS — F319 Bipolar disorder, unspecified: Principal | ICD-10-CM | POA: Diagnosis present

## 2020-01-16 LAB — RESPIRATORY PANEL BY RT PCR (FLU A&B, COVID)
Influenza A by PCR: NEGATIVE
Influenza B by PCR: NEGATIVE
SARS Coronavirus 2 by RT PCR: NEGATIVE

## 2020-01-16 LAB — URINE DRUG SCREEN, QUALITATIVE (ARMC ONLY)
Amphetamines, Ur Screen: POSITIVE — AB
Barbiturates, Ur Screen: NOT DETECTED
Benzodiazepine, Ur Scrn: NOT DETECTED
Cannabinoid 50 Ng, Ur ~~LOC~~: NOT DETECTED
Cocaine Metabolite,Ur ~~LOC~~: NOT DETECTED
MDMA (Ecstasy)Ur Screen: NOT DETECTED
Methadone Scn, Ur: NOT DETECTED
Opiate, Ur Screen: NOT DETECTED
Phencyclidine (PCP) Ur S: NOT DETECTED
Tricyclic, Ur Screen: NOT DETECTED

## 2020-01-16 LAB — URINALYSIS, COMPLETE (UACMP) WITH MICROSCOPIC
Bacteria, UA: NONE SEEN
Bilirubin Urine: NEGATIVE
Glucose, UA: NEGATIVE mg/dL
Hgb urine dipstick: NEGATIVE
Ketones, ur: NEGATIVE mg/dL
Leukocytes,Ua: NEGATIVE
Nitrite: NEGATIVE
Protein, ur: NEGATIVE mg/dL
Specific Gravity, Urine: 1.002 — ABNORMAL LOW (ref 1.005–1.030)
pH: 6 (ref 5.0–8.0)

## 2020-01-16 MED ORDER — LORAZEPAM 2 MG/ML IJ SOLN
2.0000 mg | Freq: Once | INTRAMUSCULAR | Status: DC | PRN
  Filled 2020-01-16: qty 1

## 2020-01-16 MED ORDER — HALOPERIDOL 2 MG PO TABS
2.0000 mg | ORAL_TABLET | ORAL | Status: AC
  Administered 2020-01-16: 2 mg via ORAL
  Filled 2020-01-16: qty 1

## 2020-01-16 MED ORDER — NICOTINE 21 MG/24HR TD PT24
21.0000 mg | MEDICATED_PATCH | Freq: Once | TRANSDERMAL | Status: DC
  Administered 2020-01-16: 21 mg via TRANSDERMAL
  Filled 2020-01-16: qty 1

## 2020-01-16 MED ORDER — LORAZEPAM 2 MG/ML IJ SOLN: 2.0000 mg | Freq: Once | INTRAMUSCULAR | Status: AC

## 2020-01-16 MED ORDER — DIPHENHYDRAMINE HCL 25 MG PO CAPS
50.0000 mg | ORAL_CAPSULE | Freq: Once | ORAL | Status: AC
  Administered 2020-01-16: 50 mg via ORAL
  Filled 2020-01-16: qty 2

## 2020-01-16 MED ORDER — HALOPERIDOL LACTATE 5 MG/ML IJ SOLN
5.0000 mg | Freq: Once | INTRAMUSCULAR | Status: DC | PRN
  Filled 2020-01-16: qty 1

## 2020-01-16 MED ORDER — ACETAMINOPHEN 325 MG PO TABS
650.0000 mg | ORAL_TABLET | Freq: Four times a day (QID) | ORAL | Status: DC | PRN
  Administered 2020-01-16 – 2020-01-26 (×4): 650 mg via ORAL
  Filled 2020-01-16 (×4): qty 2

## 2020-01-16 MED ORDER — QUETIAPINE FUMARATE 200 MG PO TABS: 200.0000 mg | ORAL_TABLET | Freq: Every day | ORAL | Status: DC

## 2020-01-16 MED ORDER — HALOPERIDOL LACTATE 5 MG/ML IJ SOLN
5.0000 mg | Freq: Once | INTRAMUSCULAR | Status: DC
  Filled 2020-01-16 (×2): qty 1

## 2020-01-16 MED ORDER — HALOPERIDOL LACTATE 5 MG/ML IJ SOLN: 5.0000 mg | Freq: Once | INTRAMUSCULAR | Status: AC

## 2020-01-16 MED ORDER — HALOPERIDOL 5 MG PO TABS
5.0000 mg | ORAL_TABLET | Freq: Once | ORAL | Status: AC
  Administered 2020-01-16: 5 mg via ORAL
  Filled 2020-01-16: qty 1

## 2020-01-16 MED ORDER — MAGNESIUM HYDROXIDE 400 MG/5ML PO SUSP: 30.0000 mL | Freq: Every day | ORAL | Status: DC | PRN

## 2020-01-16 MED ORDER — QUETIAPINE FUMARATE 100 MG PO TABS
100.0000 mg | ORAL_TABLET | Freq: Every day | ORAL | Status: DC
  Administered 2020-01-16 – 2020-01-17 (×2): 100 mg via ORAL
  Filled 2020-01-16 (×2): qty 1

## 2020-01-16 MED ORDER — ALUM & MAG HYDROXIDE-SIMETH 200-200-20 MG/5ML PO SUSP
30.0000 mL | ORAL | Status: DC | PRN
  Administered 2020-01-24 – 2020-01-25 (×2): 30 mL via ORAL
  Filled 2020-01-16 (×2): qty 30

## 2020-01-16 MED ORDER — DIPHENHYDRAMINE HCL 50 MG/ML IJ SOLN: 50.0000 mg | Freq: Once | INTRAMUSCULAR | Status: AC

## 2020-01-16 MED ORDER — OLANZAPINE 10 MG PO TABS: 10.0000 mg | ORAL_TABLET | Freq: Every day | ORAL | Status: DC

## 2020-01-16 MED ORDER — LORAZEPAM 2 MG/ML IJ SOLN
2.0000 mg | Freq: Once | INTRAMUSCULAR | Status: AC | PRN
  Administered 2020-01-16: 2 mg via INTRAMUSCULAR

## 2020-01-16 MED ORDER — LORAZEPAM 2 MG/ML IJ SOLN
3.0000 mg | Freq: Once | INTRAMUSCULAR | Status: AC
  Administered 2020-01-16: 16:00:00 3 mg via INTRAMUSCULAR
  Filled 2020-01-16: qty 2

## 2020-01-16 MED ORDER — LORAZEPAM 2 MG PO TABS
2.0000 mg | ORAL_TABLET | Freq: Once | ORAL | Status: AC
  Administered 2020-01-16: 2 mg via ORAL
  Filled 2020-01-16: qty 1

## 2020-01-16 MED ORDER — LORAZEPAM 2 MG/ML IJ SOLN
2.0000 mg | Freq: Four times a day (QID) | INTRAMUSCULAR | Status: DC | PRN
  Administered 2020-01-16 – 2020-01-22 (×3): 2 mg via INTRAMUSCULAR
  Filled 2020-01-16 (×3): qty 1

## 2020-01-16 MED ORDER — DIPHENHYDRAMINE HCL 50 MG/ML IJ SOLN
50.0000 mg | Freq: Once | INTRAMUSCULAR | Status: AC
  Administered 2020-01-16: 22:00:00 50 mg via INTRAMUSCULAR
  Filled 2020-01-16 (×2): qty 1

## 2020-01-16 NOTE — [facility] Suicide Risk Assessment (Signed)
[facility] Admission Suicide Risk Assessment   Nursing information obtained from:    Demographic factors:    Current Mental Status:    Loss Factors:    Historical Factors:    Risk Reduction Factors:     Total Time spent with patient: 1 hour Principal Problem: Amphetamine and psychostimulant-induced psychosis with hallucinations (HCC) Diagnosis:  Principal Problem:   Amphetamine and psychostimulant-induced psychosis with hallucinations (HCC) Active Problems:   Drug abuse (HCC)   Amphetamine abuse (HCC)   Bipolar 1 disorder (HCC)  Subjective Data: 44 year old man presented to the emergency room voicing suicidal ideation.  Patient is agitated and confused with some hallucinations and delusions although by the time I saw him I think he has calm down a fair bit.  He denies active suicidal intent.  Patient has a history of suicidal statements in the past but has not actively tried to harm himself.  He has an ongoing problem with drug abuse and noncompliance  Continued Clinical Symptoms:    The "Alcohol Use Disorders Identification Test", Guidelines for Use in Primary Care, Second Edition.  World Science writer [facility]). Score between 0-7:  no or low risk or alcohol related problems. Score between 8-15:  moderate risk of alcohol related problems. Score between 16-19:  high risk of alcohol related problems. Score 20 or above:  warrants further diagnostic evaluation for alcohol dependence and treatment.   CLINICAL FACTORS:   Bipolar Disorder:   Mixed State Alcohol/Substance Abuse/Dependencies   Musculoskeletal: Strength & Muscle Tone: within normal limits Gait & Station: normal Patient leans: N/A  Psychiatric Specialty Exam: Physical Exam  Nursing note and vitals reviewed. Constitutional: He appears well-developed and well-nourished.  HENT:  Head: Normocephalic and atraumatic.  Eyes: Pupils are equal, round, and reactive to light. Conjunctivae are normal.  Cardiovascular: Normal  heart sounds.  Respiratory: Effort normal.  GI: Soft.  Musculoskeletal:        General: Normal range of motion.     Cervical back: Normal range of motion.  Neurological: He is alert.  Skin: Skin is warm and dry.  Psychiatric: His mood appears anxious. His speech is rapid and/or pressured. He is agitated. He is not aggressive. Thought content is paranoid and delusional. Cognition and memory are impaired. He expresses impulsivity and inappropriate judgment. He expresses suicidal ideation. He expresses no homicidal ideation. He expresses no suicidal plans.    Review of Systems  Constitutional: Negative.   HENT: Negative.   Eyes: Negative.   Respiratory: Negative.   Cardiovascular: Negative.   Gastrointestinal: Negative.   Musculoskeletal: Negative.   Skin: Negative.   Neurological: Negative.   Psychiatric/Behavioral: Positive for decreased concentration and suicidal ideas. The patient is hyperactive.     There were no vitals taken for this visit.There is no height or weight on file to calculate BMI.  General Appearance: Casual  Eye Contact:  Good  Speech:  Pressured  Volume:  Increased  Mood:  Anxious and Irritable  Affect:  Inappropriate and Labile  Thought Process:  Disorganized  Orientation:  Full (Time, Place, and Person)  Thought Content:  Illogical, Delusions and Hallucinations: Auditory  Suicidal Thoughts:  Yes.  without intent/plan  Homicidal Thoughts:  No  Memory:  Immediate;   Fair Recent;   Poor Remote;   Fair  Judgement:  Impaired  Insight:  Shallow  Psychomotor Activity:  Increased and Restlessness  Concentration:  Concentration: Poor  Recall:  [employer] of Knowledge:  Fair  Language:  Fair  Akathisia:  Yes  Handed:  Right  AIMS (if indicated):     Assets:  Desire for Improvement Physical Health Resilience Social Support  ADL's:  Impaired  Cognition:  Impaired,  Mild  Sleep:         COGNITIVE FEATURES THAT CONTRIBUTE TO RISK:  Loss of executive  function    SUICIDE RISK:   Mild:  Suicidal ideation of limited frequency, intensity, duration, and specificity.  There are no identifiable plans, no associated intent, mild dysphoria and related symptoms, good self-control (both objective and subjective assessment), few other risk factors, and identifiable protective factors, including available and accessible social support.  PLAN OF CARE: Continue 15-minute checks.  Patient will be given as needed medicine for agitation as well as restarted on some medicine for bipolar disorder.  Reassess over the next day or 2 and reassess his dangerousness daily before arranging for discharge planning  I certify that inpatient services furnished can reasonably be expected to improve the patient's condition.   Alethia Berthold, MD 01/16/2020, 4:35 PM

## 2020-01-16 NOTE — ED Notes (Signed)
Attempted to call mother for pt.  No answer.

## 2020-01-16 NOTE — ED Provider Notes (Signed)
Emergency Medicine Observation Re-evaluation Note  [name] is a 44 y.o. male, seen on rounds today.  Pt initially presented to the ED for complaints of Nausea Currently, the patient is resting in no acute distress.  Physical Exam  Pulse (!) 118   Temp 97.9 F (36.6 C)   Resp 18   Ht 6\' 1"  (1.854 m)   Wt 77.1 kg   SpO2 95%   BMI 22.43 kg/m  Physical Exam  ED Course / MDM  EKG:  Clinical Course as of Jan 15 600  Mon Jan 15, 2020  2236 Repeat BMP significantly improved.  Patient tolerating p.o.  Patient resting comfortably now.  Pending psych evaluation however I suspect this is primarily substance abuse related presentation.   [PR]    Clinical Course User Index [PR] Jan 17, 2020, MD   I have reviewed the labs performed to date as well as medications administered while in observation.  No events overnight. Plan  Current plan is for psychiatric disposition. Patient is under full IVC at this time.   Willy Eddy, MD 01/16/20 [phone]

## 2020-01-16 NOTE — ED Notes (Signed)
No need to sign discharge form for psych admit, pt IVC

## 2020-01-16 NOTE — Plan of Care (Signed)
New admission.  Problem: Education: Goal: Knowledge of [facility] Education information/materials will improve Outcome: Not Progressing Goal: Emotional status will improve Outcome: Not Progressing Goal: Mental status will improve Outcome: Not Progressing Goal: Verbalization of understanding the information provided will improve Outcome: Not Progressing   Problem: Safety: Goal: Periods of time without injury will increase Outcome: Not Progressing   Problem: Physical Regulation: Goal: Complications related to the disease process, condition or treatment will be avoided or minimized Outcome: Not Progressing   Problem: Safety: Goal: Ability to remain free from injury will improve Outcome: Not Progressing   Problem: Education: Goal: Will be free of psychotic symptoms Outcome: Not Progressing Goal: Knowledge of the prescribed therapeutic regimen will improve Outcome: Not Progressing   Problem: Coping: Goal: Coping ability will improve Outcome: Not Progressing Goal: Will verbalize feelings Outcome: Not Progressing   Problem: Safety: Goal: Ability to remain free from injury will improve Outcome: Not Progressing

## 2020-01-16 NOTE — Consult Note (Signed)
[facility] Face-to-Face Psychiatry Consult   Reason for Consult: Nausea Referring Physician: Dr. Roxan Hockey Patient Identification: [name] MRN:  [id number] Principal Diagnosis: <principal problem not specified> Diagnosis:  Active Problems:   Cannabis use disorder, moderate, dependence (HCC)   Tobacco use disorder   Bipolar 2 disorder, major depressive episode (HCC)   Methamphetamine use disorder, severe (HCC)   Drug abuse (HCC)   Nicotine dependence   Amphetamine abuse (HCC)   Substance induced mood disorder (HCC)   Amphetamine and psychostimulant-induced psychosis with hallucinations (HCC)   Total Time spent with patient: 45 minutes  Subjective: "Hey, you think I can get me some Klonopin, Ativan, or Xanax?" [name] is a 44 y.o. male patient presented to [facility] ED via law enforcement under involuntary commitment status (IVC).  Per the triage nursing note, the patient was brought in due to bizarre behavior, complaints of nausea x 2 to 3 hours.  The patient was agitated in triage, frequently yelling out and unable to sit still  The patient was seen face-to-face by this provider; chart reviewed and consulted with Dr. Dolores Frame on 01/16/2020 due to the patient's care. It was discussed with the EDP that the patient does meet the criteria to be admitted to the psychiatric inpatient unit.   Due to him voicing, "I have been trying to overdose on methamphetamine intentionally.  I tried three times in the last few weeks, but I am not successful." The patient is continuously asking for benzodiazepines any time he makes contact with this Clinical research associate. On evaluation, the patient is alert and oriented x 3, anxious, verbally aggressive, inappropriate behaviors, but cooperative and answering questions, and mood-congruent with affect.  The patient does appear to be responding to internal or external stimuli. The patient is presenting with delusional thinking. The patient denies auditory or visual  hallucinations. The patient admits to suicidal ideation with a plan to intentionally overdose on methamphetamine.  He denies homicidal or self-harm ideations. The patient is presenting with some psychotic and paranoid behaviors. During an encounter with the patient, he answered questions that were posed to him appropriately.  Plan: The patient is a safety risk to self and does require psychiatric inpatient admission for stabilization and treatment.  HPI: Per Dr. Roxan Hockey: [name] is a 44 y.o. male listed past medical history presents to the ER confused and agitated.  Appears to be under influence of substance he does endorse using meth in the past several days.  States he has not slept in 4 days.  States he is having thoughts of harming himself.  He does seem somewhat agitated but is redirectable.  Denies any history or report of trauma.  Denies any pain.  Reportedly walking about ER waiting room and wandering into patient rooms.   Past Psychiatric History:  Amphetamine abuse (HCC) Anxiety Bipolar disorder (HCC) Depression Drug abuse (HCC) Marijuana abuse   Risk to Self: Suicidal Ideation: Yes-Currently Present Suicidal Intent: Yes-Currently Present Is patient at risk for suicide?: Yes Suicidal Plan?: Yes-Currently Present Specify Current Suicidal Plan: OD on meth  Access to Means: Yes Specify Access to Suicidal Means: Hx of drug use  What has been your use of drugs/alcohol within the last 12 months?: Meth  How many times?: 3 Triggers for Past Attempts: Unknown Intentional Self Injurious Behavior: None Risk to Others: Homicidal Ideation: No Thoughts of Harm to Others: No Current Homicidal Intent: No Current Homicidal Plan: No Access to Homicidal Means: No History of harm to others?: No Assessment of Violence:  None Noted Does patient have access to weapons?: No Criminal Charges Pending?: No Does patient have a court date: No Prior Inpatient Therapy: Prior Inpatient  Therapy: Yes Prior Therapy Dates: 2018 Prior Therapy Facilty/Provider(s): [facility]  Reason for Treatment: Bipolar Disorder  Prior Outpatient Therapy: Prior Outpatient Therapy: No Does patient have an ACCT team?: No Does patient have Intensive In-House Services?  : No Does patient have [facility] services? : No Does patient have P4CC services?: No  Past Medical History:  Past Medical History:  Diagnosis Date  . 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 Anxiety   . Bipolar disorder (HCC)   . Depression   . Drug abuse (HCC)   . Hepatitis C   . Hepatitis C antibody positive in blood 09/08/2017  . Marijuana abuse 09/07/2017  . Sinus congestion     Past Surgical History:  Procedure Laterality Date  . ANKLE SURGERY    . HERNIA REPAIR    . HERNIA REPAIR    . TENDON REPAIR    . TIBIA IM NAIL INSERTION Left 09/06/2017   Procedure: INTRAMEDULLARY (IM) NAIL TIBIAL LEFT;  Surgeon: Myrene Galas, MD;  Location: [facility] OR;  Service: Orthopedics;  Laterality: Left;   Family History:  Family History  Problem Relation Age of Onset  . Heart failure Mother    Family Psychiatric  History:  Social History:  Social History   Substance and Sexual Activity  Alcohol Use Yes   Comment: reports weekend use (fri/sat night)     Social History   Substance and Sexual Activity  Drug Use Yes  . Frequency: 3.0 times per week  . Types: Marijuana, Methamphetamines    Social History   Socioeconomic History  . Marital status: Divorced    Spouse name: Not on file  . Number of children: Not on file  . Years of education: Not on file  . Highest education level: Not on file  Occupational History  . Not on file  Tobacco Use  . Smoking status: Current Every Day Smoker    Packs/day: 1.00    Types: Cigarettes  . Smokeless tobacco: Current User  Substance and Sexual Activity  . Alcohol use: Yes    Comment: reports weekend use (fri/sat night)  . Drug use: Yes    Frequency: 3.0 times per week    Types:  Marijuana, Methamphetamines  . Sexual activity: Not on file  Other Topics Concern  . Not on file  Social History Narrative   ** Merged History Encounter **       Social Determinants of Health   Financial Resource Strain:   . Difficulty of Paying Living Expenses:   Food Insecurity:   . Worried About Programme researcher, broadcasting/film/video in the Last Year:   . Barista in the Last Year:   Transportation Needs:   . Freight forwarder (Medical):   Marland Kitchen Lack of Transportation (Non-Medical):   Physical Activity:   . Days of Exercise per Week:   . Minutes of Exercise per Session:   Stress:   . Feeling of Stress :   Social Connections:   . Frequency of Communication with Friends and Family:   . Frequency of Social Gatherings with Friends and Family:   . Attends Religious Services:   . Active Member of Clubs or Organizations:   . Attends Banker Meetings:   Marland Kitchen Marital Status:    Additional Social History:    Allergies:   Allergies  Allergen Reactions  . Tramadol  Nausea And Vomiting    Labs:  Results for orders placed or performed during the hospital encounter of 01/15/20 (from the past 48 hour(s))  Lipase, blood     Status: None   Collection Time: 01/15/20  4:48 PM  Result Value Ref Range   Lipase 24 11 - 51 U/L    Comment: Performed at [facility], [address] [place], [place] [address]  Comprehensive metabolic panel     Status: Abnormal   Collection Time: 01/15/20  4:48 PM  Result Value Ref Range   Sodium 139 135 - 145 mmol/L   Potassium 4.1 3.5 - 5.1 mmol/L   Chloride 101 98 - 111 mmol/L   CO2 24 22 - 32 mmol/L   Glucose, Bld 88 70 - 99 mg/dL    Comment: Glucose reference range applies only to samples taken after fasting for at least 8 hours.   BUN 15 6 - 20 mg/dL   Creatinine, Ser 1.02 (H) 0.61 - 1.24 mg/dL   Calcium 58.5 8.9 - 27.7 mg/dL   Total Protein 8.2 (H) 6.5 - 8.1 g/dL   Albumin 5.1 (H) 3.5 - 5.0 g/dL   AST 45 (H) 15 - 41 U/L   ALT 64  (H) 0 - 44 U/L   Alkaline Phosphatase 57 38 - 126 U/L   Total Bilirubin 1.5 (H) 0.3 - 1.2 mg/dL   GFR calc non Af Amer 40 (L) >60 mL/min   GFR calc Af Amer 46 (L) >60 mL/min   Anion gap 14 5 - 15    Comment: Performed at [facility], [address] [place], [place] [address]  CBC     Status: Abnormal   Collection Time: 01/15/20  4:48 PM  Result Value Ref Range   WBC 20.1 (H) 4.0 - 10.5 K/uL   RBC 5.27 4.22 - 5.81 MIL/uL   Hemoglobin 15.8 13.0 - 17.0 g/dL   HCT 53.6 14.4 - 31.5 %   MCV 86.7 80.0 - 100.0 fL   MCH 30.0 26.0 - 34.0 pg   MCHC 34.6 30.0 - 36.0 g/dL   RDW 40.0 86.7 - 61.9 %   Platelets 303 150 - 400 K/uL   nRBC 0.0 0.0 - 0.2 %    Comment: Performed at [facility], [address], [place], [place] [address]  Basic metabolic panel     Status: Abnormal   Collection Time: 01/15/20 10:00 PM  Result Value Ref Range   Sodium 136 135 - 145 mmol/L   Potassium 3.9 3.5 - 5.1 mmol/L   Chloride 106 98 - 111 mmol/L   CO2 22 22 - 32 mmol/L   Glucose, Bld 108 (H) 70 - 99 mg/dL    Comment: Glucose reference range applies only to samples taken after fasting for at least 8 hours.   BUN 18 6 - 20 mg/dL   Creatinine, Ser 6.71 (H) 0.61 - 1.24 mg/dL   Calcium 8.5 (L) 8.9 - 10.3 mg/dL   GFR calc non Af Amer 56 (L) >60 mL/min   GFR calc Af Amer >60 >60 mL/min   Anion gap 8 5 - 15    Comment: Performed at [facility], [address], [place], [place] [address]    Current Facility-Administered Medications  Medication Dose Route Frequency Provider Last Rate Last Admin  . LORazepam (ATIVAN) injection 1 mg  1 mg Intravenous Once Willy Eddy, MD   Stopped at 01/15/20 1912  . sodium chloride flush (NS) 0.9 % injection  3 mL  3 mL Intravenous Once Merlyn Lot, MD   Stopped at 01/15/20 1910   Current Outpatient Medications  Medication Sig Dispense Refill  . aspirin EC 325 MG tablet Take 1 tablet (325 mg total) by mouth daily. 30 tablet 0  .  QUEtiapine (SEROQUEL) 200 MG tablet Take 1 tablet (200 mg total) by mouth at bedtime. 30 tablet 3    Musculoskeletal: Strength & Muscle Tone: within normal limits Gait & Station: normal Patient leans: N/A  Psychiatric Specialty Exam: Physical Exam  Nursing note and vitals reviewed. Constitutional: He is oriented to person, place, and time. He appears well-developed.  Respiratory: Effort normal.  Musculoskeletal:        General: Normal range of motion.     Cervical back: Normal range of motion.  Neurological: He is alert and oriented to person, place, and time.    Review of Systems  Psychiatric/Behavioral: Positive for agitation, behavioral problems, confusion, hallucinations, self-injury and suicidal ideas. The patient is nervous/anxious.   All other systems reviewed and are negative.   Pulse (!) 118, temperature 97.9 F (36.6 C), resp. rate 18, height 6\' 1"  (1.854 m), weight 77.1 kg, SpO2 95 %.Body mass index is 22.43 kg/m.  General Appearance: Bizarre and Disheveled  Eye Contact:  Fair  Speech:  Garbled and Pressured  Volume:  Increased  Mood:  Angry, Anxious, Euphoric and Irritable  Affect:  Blunt, Congruent, Depressed and Inappropriate  Thought Process:  Goal Directed  Orientation:  Full (Time, Place, and Person)  Thought Content:  Illogical, Delusions and Paranoid Ideation  Suicidal Thoughts:  Yes.  with intent/plan  Homicidal Thoughts:  No  Memory:  Immediate;   Good Recent;   Good Remote;   Good  Judgement:  Impaired  Insight:  Lacking  Psychomotor Activity:  Normal  Concentration:  Concentration: Poor and Attention Span: Poor  Recall:  [employer] of Knowledge:  Poor  Language:  Fair  Akathisia:  Negative  Handed:  Right  AIMS (if indicated):     Assets:  Desire for Improvement Housing Resilience  ADL's:  Intact  Cognition:  WNL  Sleep:    Okay     Treatment Plan Summary: Daily contact with patient to assess and evaluate symptoms and progress in  treatment, Medication management, Plan The patient meets criteria for psychiatric inpatient admission. and .  Disposition: Recommend psychiatric Inpatient admission when medically cleared. Supportive therapy provided about ongoing stressors. The patient meets criteria for psychiatric inpatient admission.  Caroline Sauger, NP 01/16/2020 3:52 AM

## 2020-01-16 NOTE — ED Notes (Signed)
Pt becoming increasingly agitated.  Wanting to leave. Explained cannot leave.  Pacing in room.  Willing to let RN give something to help calm him down.

## 2020-01-16 NOTE — ED Notes (Signed)
Pt keeps stating "when my mother gets dressed she is coming up here and taking me home." Officer Vear [employer] and this tech has explained to pt several times that he is here under IVC and that he can not leave once his mother gets here.

## 2020-01-16 NOTE — Tx Team (Signed)
Initial Treatment Plan 01/16/2020 5:13 PM [name] BOQ:[id number]    PATIENT STRESSORS: Financial difficulties Marital or family conflict Substance abuse   PATIENT STRENGTHS: General fund of knowledge Physical Health   PATIENT IDENTIFIED PROBLEMS: Psychosis  Substance abuse  Homeless  Depression  Anxiety             DISCHARGE CRITERIA:  Ability to meet basic life and health needs Improved stabilization in mood, thinking, and/or behavior Need for constant or close observation no longer present Reduction of life-threatening or endangering symptoms to within safe limits  PRELIMINARY DISCHARGE PLAN: Outpatient therapy  PATIENT/FAMILY INVOLVEMENT: This treatment plan has been presented to and reviewed with the patient, [name]. The patient has been given the opportunity to ask questions and make suggestions.  Avaleigh Decuir, RN 01/16/2020, 5:13 PM

## 2020-01-16 NOTE — ED Notes (Signed)
Pt being reviewed for possible admission to [facility]. H&P and Assessment has been provided to the BH Unit for the charge nurse to review and provide bed assignment.        

## 2020-01-16 NOTE — BH Assessment (Signed)
Patient is to be admitted to [facility] BMU by Dr. Cindy Hazy.  Attending Physician will be. Cindy Hazy, MD.   Patient has been assigned to room 313, by [facility] Charge Nurse Doyce Para, RN.   Intake Paper Work has been signed and placed on patient chart.  ER staff is aware of the admission: 1. Theodoro Grist, ER Secretary  2. Mort Sawyers, ER MD  3. Vikki Ports Patient's Nurse  4. [facility] Patient Access.

## 2020-01-16 NOTE — ED Notes (Signed)
UA clicked off by accident. Pt poured urine sample out.

## 2020-01-16 NOTE — ED Notes (Signed)
Psych MD at bedside

## 2020-01-16 NOTE — ED Notes (Signed)
Pt trying to argue with security guard about leaving. Pt asked for phone to call mother. Pt was made aware that phone hours isn't until 1pm now. Pt stated "man this is fucking bullshit. My mom is outside waiting for me to come out." Pt was made aware once again that he is not leaving and that he is being committed. Pt went in rm and closed door.

## 2020-01-16 NOTE — Consult Note (Signed)
[facility] Face-to-Face Psychiatry Consult   Reason for Consult: Nausea Referring Physician: Dr. Roxan Hockey Patient Identification: [name] MRN:  [id number] Principal Diagnosis: <principal problem not specified> Diagnosis:  Active Problems:   Drug abuse (HCC)   Total Time spent with patient: 15 minutes  Subjective: Pt is irritable. He continues to states that he intentionally overdosed on meth and is elusive if this was a suicide attempt. He stated that he "wanted to see what it would do".  No obvious hallucinations or delusions at this time. The reason for admission is primarily to better evaluate extent of depression and suicidal intent.  Hx per ED Evaluation  [name] is a 44 y.o. male patient presented to [facility] ED via law enforcement under involuntary commitment status (IVC).  Per the triage nursing note, the patient was brought in due to bizarre behavior, complaints of nausea x 2 to 3 hours.  The patient was agitated in triage, frequently yelling out and unable to sit still  The patient was seen face-to-face by this provider; chart reviewed and consulted with Dr. Dolores Frame on 01/16/2020 due to the patient's care. It was discussed with the EDP that the patient does meet the criteria to be admitted to the psychiatric inpatient unit.   Due to him voicing, "I have been trying to overdose on methamphetamine intentionally.  I tried three times in the last few weeks, but I am not successful." The patient is continuously asking for benzodiazepines any time he makes contact with this Clinical research associate. On evaluation, the patient is alert and oriented x 3, anxious, verbally aggressive, inappropriate behaviors, but cooperative and answering questions, and mood-congruent with affect.  The patient does appear to be responding to internal or external stimuli. The patient is presenting with delusional thinking. The patient denies auditory or visual hallucinations. The patient admits to suicidal ideation with a  plan to intentionally overdose on methamphetamine.  He denies homicidal or self-harm ideations. The patient is presenting with some psychotic and paranoid behaviors. During an encounter with the patient, he answered questions that were posed to him appropriately.  Plan: The patient is a safety risk to self and does require psychiatric inpatient admission for stabilization and treatment.  HPI: Per Dr. Roxan Hockey: [name] is a 44 y.o. male listed past medical history presents to the ER confused and agitated.  Appears to be under influence of substance he does endorse using meth in the past several days.  States he has not slept in 4 days.  States he is having thoughts of harming himself.  He does seem somewhat agitated but is redirectable.  Denies any history or report of trauma.  Denies any pain.  Reportedly walking about ER waiting room and wandering into patient rooms.   Past Psychiatric History:  Amphetamine abuse (HCC) Anxiety Bipolar disorder (HCC) Depression Drug abuse (HCC) Marijuana abuse   Risk to Self:   Risk to Others:   Prior Inpatient Therapy:   Prior Outpatient Therapy:    Past Medical History:  Past Medical History:  Diagnosis Date  . 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 Anxiety   . Bipolar disorder (HCC)   . Depression   . Drug abuse (HCC)   . Hepatitis C   . Hepatitis C antibody positive in blood 09/08/2017  . Marijuana abuse 09/07/2017  . Sinus congestion     Past Surgical History:  Procedure Laterality Date  . ANKLE SURGERY    . HERNIA REPAIR    . HERNIA REPAIR    .  TENDON REPAIR    . TIBIA IM NAIL INSERTION Left 09/06/2017   Procedure: INTRAMEDULLARY (IM) NAIL TIBIAL LEFT;  Surgeon: Myrene Galas, MD;  Location: [facility] OR;  Service: Orthopedics;  Laterality: Left;   Family History:  Family History  Problem Relation Age of Onset  . Heart failure Mother    Family Psychiatric  History:  Social History:  Social History   Substance and Sexual  Activity  Alcohol Use Yes   Comment: reports weekend use (fri/sat night)     Social History   Substance and Sexual Activity  Drug Use Yes  . Frequency: 3.0 times per week  . Types: Marijuana, Methamphetamines    Social History   Socioeconomic History  . Marital status: Divorced    Spouse name: Not on file  . Number of children: Not on file  . Years of education: Not on file  . Highest education level: Not on file  Occupational History  . Not on file  Tobacco Use  . Smoking status: Current Every Day Smoker    Packs/day: 1.00    Types: Cigarettes  . Smokeless tobacco: Current User  Substance and Sexual Activity  . Alcohol use: Yes    Comment: reports weekend use (fri/sat night)  . Drug use: Yes    Frequency: 3.0 times per week    Types: Marijuana, Methamphetamines  . Sexual activity: Not on file  Other Topics Concern  . Not on file  Social History Narrative   ** Merged History Encounter **       Social Determinants of Health   Financial Resource Strain:   . Difficulty of Paying Living Expenses:   Food Insecurity:   . Worried About Programme researcher, broadcasting/film/video in the Last Year:   . Barista in the Last Year:   Transportation Needs:   . Freight forwarder (Medical):   Marland Kitchen Lack of Transportation (Non-Medical):   Physical Activity:   . Days of Exercise per Week:   . Minutes of Exercise per Session:   Stress:   . Feeling of Stress :   Social Connections:   . Frequency of Communication with Friends and Family:   . Frequency of Social Gatherings with Friends and Family:   . Attends Religious Services:   . Active Member of Clubs or Organizations:   . Attends Banker Meetings:   Marland Kitchen Marital Status:    Additional Social History:    Allergies:   Allergies  Allergen Reactions  . Tramadol Nausea And Vomiting    Labs:  Results for orders placed or performed during the hospital encounter of 01/15/20 (from the past 48 hour(s))  Urinalysis, Complete w  Microscopic     Status: Abnormal   Collection Time: 01/15/20 12:05 PM  Result Value Ref Range   Color, Urine COLORLESS (A) YELLOW   APPearance CLEAR (A) CLEAR   Specific Gravity, Urine 1.002 (L) 1.005 - 1.030   pH 6.0 5.0 - 8.0   Glucose, UA NEGATIVE NEGATIVE mg/dL   Hgb urine dipstick NEGATIVE NEGATIVE   Bilirubin Urine NEGATIVE NEGATIVE   Ketones, ur NEGATIVE NEGATIVE mg/dL   Protein, ur NEGATIVE NEGATIVE mg/dL   Nitrite NEGATIVE NEGATIVE   Leukocytes,Ua NEGATIVE NEGATIVE   RBC / HPF 0-5 0 - 5 RBC/hpf   WBC, UA 0-5 0 - 5 WBC/hpf   Bacteria, UA NONE SEEN NONE SEEN   Squamous Epithelial / LPF 0-5 0 - 5    Comment: Performed at [facility], [address] [place], [place] [address]  Urine Drug Screen, Qualitative ([place] only)     Status: Abnormal   Collection Time: 01/15/20 12:05 PM  Result Value Ref Range   Tricyclic, Ur Screen NONE DETECTED NONE DETECTED   Amphetamines, Ur Screen POSITIVE (A) NONE DETECTED   MDMA (Ecstasy)Ur Screen NONE DETECTED NONE DETECTED   Cocaine Metabolite,Ur [place] NONE DETECTED NONE DETECTED   Opiate, Ur Screen NONE DETECTED NONE DETECTED   Phencyclidine (PCP) Ur S NONE DETECTED NONE DETECTED   Cannabinoid 50 Ng, Ur [place] NONE DETECTED NONE DETECTED   Barbiturates, Ur Screen NONE DETECTED NONE DETECTED   Benzodiazepine, Ur Scrn NONE DETECTED NONE DETECTED   Methadone Scn, Ur NONE DETECTED NONE DETECTED    Comment: (NOTE) Tricyclics + metabolites, urine    Cutoff 1000 ng/mL Amphetamines + metabolites, urine  Cutoff 1000 ng/mL MDMA (Ecstasy), urine              Cutoff 500 ng/mL Cocaine Metabolite, urine          Cutoff 300 ng/mL Opiate + metabolites, urine        Cutoff 300 ng/mL Phencyclidine (PCP), urine         Cutoff 25 ng/mL Cannabinoid, urine                 Cutoff 50 ng/mL Barbiturates + metabolites, urine  Cutoff 200 ng/mL Benzodiazepine, urine              Cutoff 200 ng/mL Methadone, urine                   Cutoff 300 ng/mL The urine  drug screen provides only a preliminary, unconfirmed analytical test result and should not be used for non-medical purposes. Clinical consideration and professional judgment should be applied to any positive drug screen result due to possible interfering substances. A more specific alternate chemical method must be used in order to obtain a confirmed analytical result. Gas chromatography / mass spectrometry (GC/MS) is the preferred confirmat ory method. Performed at [facility], [address] [place], [place] [address]   Lipase, blood     Status: None   Collection Time: 01/15/20  4:48 PM  Result Value Ref Range   Lipase 24 11 - 51 U/L    Comment: Performed at [facility], [address] [place], [place] [address]  Comprehensive metabolic panel     Status: Abnormal   Collection Time: 01/15/20  4:48 PM  Result Value Ref Range   Sodium 139 135 - 145 mmol/L   Potassium 4.1 3.5 - 5.1 mmol/L   Chloride 101 98 - 111 mmol/L   CO2 24 22 - 32 mmol/L   Glucose, Bld 88 70 - 99 mg/dL    Comment: Glucose reference range applies only to samples taken after fasting for at least 8 hours.   BUN 15 6 - 20 mg/dL   Creatinine, Ser 2.00 (H) 0.61 - 1.24 mg/dL   Calcium 10.1 8.9 - 10.3 mg/dL   Total Protein 8.2 (H) 6.5 - 8.1 g/dL   Albumin 5.1 (H) 3.5 - 5.0 g/dL   AST 45 (H) 15 - 41 U/L   ALT 64 (H) 0 - 44 U/L   Alkaline Phosphatase 57 38 - 126 U/L   Total Bilirubin 1.5 (H) 0.3 - 1.2 mg/dL   GFR calc non Af Amer 40 (L) >60 mL/min   GFR calc Af Amer 46 (L) >60 mL/min   Anion gap 14 5 -  15    Comment: Performed at [facility], [address] [place], [place] [address]  CBC     Status: Abnormal   Collection Time: 01/15/20  4:48 PM  Result Value Ref Range   WBC 20.1 (H) 4.0 - 10.5 K/uL   RBC 5.27 4.22 - 5.81 MIL/uL   Hemoglobin 15.8 13.0 - 17.0 g/dL   HCT 84.145.7 66.039.0 - 63.052.0 %   MCV 86.7 80.0 - 100.0 fL   MCH 30.0 26.0 - 34.0 pg   MCHC 34.6 30.0 - 36.0 g/dL   RDW  16.012.2 10.911.5 - 32.315.5 %   Platelets 303 150 - 400 K/uL   nRBC 0.0 0.0 - 0.2 %    Comment: Performed at [facility], [address] [place], [place] [address]  Basic metabolic panel     Status: Abnormal   Collection Time: 01/15/20 10:00 PM  Result Value Ref Range   Sodium 136 135 - 145 mmol/L   Potassium 3.9 3.5 - 5.1 mmol/L   Chloride 106 98 - 111 mmol/L   CO2 22 22 - 32 mmol/L   Glucose, Bld 108 (H) 70 - 99 mg/dL    Comment: Glucose reference range applies only to samples taken after fasting for at least 8 hours.   BUN 18 6 - 20 mg/dL   Creatinine, Ser 2.021.50 (H) 0.61 - 1.24 mg/dL   Calcium 8.5 (L) 8.9 - 10.3 mg/dL   GFR calc non Af Amer 56 (L) >60 mL/min   GFR calc Af Amer >60 >60 mL/min   Anion gap 8 5 - 15    Comment: Performed at [facility], [address] [place], [place] [address]  Respiratory Panel by RT PCR (Flu A&B, Covid) - Nasopharyngeal Swab     Status: None   Collection Time: 01/16/20  3:43 AM   Specimen: Nasopharyngeal Swab  Result Value Ref Range   SARS Coronavirus 2 by RT PCR NEGATIVE NEGATIVE    Comment: (NOTE) SARS-CoV-2 target nucleic acids are NOT DETECTED. The SARS-CoV-2 RNA is generally detectable in upper respiratoy specimens during the acute phase of infection. The lowest concentration of SARS-CoV-2 viral copies this assay can detect is 131 copies/mL. A negative result does not preclude SARS-Cov-2 infection and should not be used as the sole basis for treatment or other patient management decisions. A negative result may occur with  improper specimen collection/handling, submission of specimen other than nasopharyngeal swab, presence of viral mutation(s) within the areas targeted by this assay, and inadequate number of viral copies (<131 copies/mL). A negative result must be combined with clinical observations, patient history, and epidemiological information. The expected result is Negative. Fact Sheet for Patients:   https://www.moore.com/https://www.fda.gov/media/142436/download Fact Sheet for Healthcare Providers:  https://www.young.biz/https://www.fda.gov/media/142435/download This test is not yet ap proved or cleared by the [place] FDA and  has been authorized for detection and/or diagnosis of SARS-CoV-2 by FDA under an Emergency Use Authorization (EUA). This EUA will remain  in effect (meaning this test can be used) for the duration of the COVID-19 declaration under Section 564(b)(1) of the Act, 21 U.S.C. section 360bbb-3(b)(1), unless the authorization is terminated or revoked sooner.    Influenza A by PCR NEGATIVE NEGATIVE   Influenza B by PCR NEGATIVE NEGATIVE    Comment: (NOTE) The Xpert Xpress SARS-CoV-2/FLU/RSV assay is intended as an aid in  the diagnosis of influenza from Nasopharyngeal swab specimens and  should not be used as a sole basis for treatment. Nasal washings and  aspirates are unacceptable for Xpert Xpress SARS-CoV-2/FLU/RSV  testing. Fact Sheet for Patients: https://www.moore.com/ Fact Sheet for Healthcare Providers: https://www.young.biz/ This test is not yet approved or cleared by the [place] FDA and  has been authorized for detection and/or diagnosis of SARS-CoV-2 by  FDA under an Emergency Use Authorization (EUA). This EUA will remain  in effect (meaning this test can be used) for the duration of the  Covid-19 declaration under Section 564(b)(1) of the Act, 21  U.S.C. section 360bbb-3(b)(1), unless the authorization is  terminated or revoked. Performed at [facility], [address] [place], [place] [address]     No current facility-administered medications for this encounter.    Psychiatric Specialty Exam:       General Appearance:Unkempt  Eye Contact:  Fair  Speech:  Regular rate and rhythm  Volume: normal  Mood: Irritable  Affect: congruent  Thought Process:  Goal Directed  Orientation:  Full (Time, Place, and Person)  Thought  Content: normal  Suicidal Thoughts: none currently  Homicidal Thoughts:  None  Memory:  Immediate;   Good Recent;   Good Remote;   Good  Judgement:  Impaired  Insight:  Lacking  Psychomotor Activity:  Normal  Concentration: normal  Recall:  [employer] of Knowledge: normal  Language:  Fair  Akathisia:  Negative  Handed:  Right  AIMS (if indicated):     Assets:  Desire for Improvement Housing Resilience  ADL's:  Intact  Cognition:  WNL  Sleep:    Okay     Treatment Plan Summary: No medication indicated at this time.  Symptomatology is resolving quickly and depression may be substance related; that is suicidality may be more prominent when he is in an intoxicated state. Thus the risk of suicide is high outside the hospital when he can abuse drugs but low inside the hospital. Given limited insight or past follow through efforts to engage in outpatient treatment are unlikely to succeed but we will certainly offer and assist as is possible  Disposition: Recommend psychiatric Inpatient admission when medically cleared. Supportive therapy provided about ongoing stressors. The patient meets criteria for psychiatric inpatient admission.  Cindy Hazy, MD 01/16/2020 2:34 PM

## 2020-01-16 NOTE — BH Assessment (Addendum)
Assessment Note  [name] is an 44 y.o. male. Who presents with complaints of SI. Pt shares " I took a bunch of drugs and tried to overdose. When asked if he is requesting assistance with arranging detox, Pt states " No I like meth" He shares that he is currently homeless and may be facing time in jail due to recent behaviors. Pt denied any scheduled court dates. He expresses that he believes that people are out to get him. Pt states " black people want to kill me". He Pt.denies the presence of any auditory or visual hallucinations at this time. Patient explains that he has been awake for four days and states "I just took too much meth and gas" Pt with a history of Bipolar disorder and chronic noncompliance with treatment. Pt confirms that he continues to actively have suicidal thoughts.     Diagnosis: Substance induced psychosis   Past Medical History:  Past Medical History:  Diagnosis Date  . Amphetamine abuse ([place]) 09/07/2017  . Anxiety   . Bipolar disorder ([place])   . Depression   . Drug abuse ([place])   . Hepatitis C   . Hepatitis C antibody positive in blood 09/08/2017  . Marijuana abuse 09/07/2017  . Sinus congestion     Past Surgical History:  Procedure Laterality Date  . ANKLE SURGERY    . HERNIA REPAIR    . HERNIA REPAIR    . TENDON REPAIR    . TIBIA IM NAIL INSERTION Left 09/06/2017   Procedure: INTRAMEDULLARY (IM) NAIL TIBIAL LEFT;  Surgeon: Altamese Kenvil, MD;  Location: [facility];  Service: Orthopedics;  Laterality: Left;    Family History:  Family History  Problem Relation Age of Onset  . Heart failure Mother     Social History:  reports that he has been smoking cigarettes. He has been smoking about 1.00 pack per day. He uses smokeless tobacco. He reports current alcohol use. He reports current drug use. Frequency: 3.00 times per week. Drugs: Marijuana and Methamphetamines.  Additional Social History:  Alcohol / Drug Use Pain Medications: See PTA Prescriptions: See  PTA Over the Counter: See PTA History of alcohol / drug use?: Yes Longest period of sobriety (when/how long): A year in jail Negative Consequences of Use: Personal relationships, Financial, Work / Youth worker, Scientist, research (physical sciences) Withdrawal Symptoms: Agitation, Sweats, Fever / Chills, Cramps, Nausea / Vomiting, Tremors, Diarrhea  CIWA: CIWA-Ar BP: (unable to obtain in triage.  Patient not keeping still.) Pulse Rate: (!) 118 COWS:    Allergies:  Allergies  Allergen Reactions  . Tramadol Nausea And Vomiting    Home Medications: (Not in a hospital admission)   OB/GYN Status:  No LMP for male patient.  General Assessment Data Location of Assessment: [facility] ED TTS Assessment: In system Is this a Tele or Face-to-Face Assessment?: Tele Assessment Is this an Initial Assessment or a Re-assessment for this encounter?: Initial Assessment Patient Accompanied by:: N/A Living Arrangements: Homeless/Shelter Living Arrangements: Other (Comment) Can pt return to current living arrangement?: Yes Admission Status: Involuntary Petitioner: Other Is patient capable of signing voluntary admission?: No Referral Source: Self/Family/Friend Insurance type: None   Medical Screening Exam ([place]) Medical Exam completed: Yes  Crisis Care Plan Living Arrangements: Other (Comment) Name of Psychiatrist: none  Name of Therapist: none   Education Status Is patient currently in school?: No Is the patient employed, unemployed or receiving disability?: Unemployed  Risk to self with the past 6 months Suicidal Ideation: Yes-Currently Present Has patient  been a risk to self within the past 6 months prior to admission? : Yes Suicidal Intent: Yes-Currently Present Has patient had any suicidal intent within the past 6 months prior to admission? : Yes Is patient at risk for suicide?: Yes Suicidal Plan?: Yes-Currently Present Has patient had any suicidal plan within the past 6 months prior to admission? :  Yes Specify Current Suicidal Plan: OD on meth  Access to Means: Yes Specify Access to Suicidal Means: Hx of drug use  What has been your use of drugs/alcohol within the last 12 months?: Meth  Previous Attempts/Gestures: Yes How many times?: 3 Triggers for Past Attempts: Unknown Intentional Self Injurious Behavior: None Family Suicide History: Unknown Recent stressful life event(s): Conflict (Comment), Legal Issues, Financial Problems Persecutory voices/beliefs?: Yes Depression: Yes Depression Symptoms: Feeling angry/irritable Substance abuse history and/or treatment for substance abuse?: Yes Suicide prevention information given to non-admitted patients: Not applicable  Risk to Others within the past 6 months Homicidal Ideation: No Does patient have any lifetime risk of violence toward others beyond the six months prior to admission? : No Thoughts of Harm to Others: No Current Homicidal Intent: No Current Homicidal Plan: No Access to Homicidal Means: No History of harm to others?: No Assessment of Violence: None Noted Does patient have access to weapons?: No Criminal Charges Pending?: No Does patient have a court date: No Is patient on probation?: No  Psychosis Hallucinations: None noted Delusions: Persecutory  Mental Status Report Appearance/Hygiene: In scrubs Eye Contact: Fair Motor Activity: Restlessness Speech: Slurred Level of Consciousness: Alert Mood: Anxious Affect: Anxious Anxiety Level: Minimal Thought Processes: Coherent, Irrelevant Judgement: Partial Orientation: Time, Person, Place, Situation Obsessive Compulsive Thoughts/Behaviors: None  Cognitive Functioning Concentration: Fair Memory: Remote Intact, Recent Intact Is patient IDD: No Insight: Poor Impulse Control: Poor Appetite: Fair Have you had any weight changes? : No Change Sleep: Decreased Total Hours of Sleep: 0 Vegetative Symptoms: None  ADLScreening [facility] Assessment Services) Patient's  cognitive ability adequate to safely complete daily activities?: Yes Patient able to express need for assistance with ADLs?: Yes Independently performs ADLs?: Yes (appropriate for developmental age)  Prior Inpatient Therapy Prior Inpatient Therapy: Yes Prior Therapy Dates: 2018 Prior Therapy Facilty/Provider(s): [facility]  Reason for Treatment: Bipolar Disorder   Prior Outpatient Therapy Prior Outpatient Therapy: No Does patient have an ACCT team?: No Does patient have Intensive In-House Services?  : No Does patient have [facility] services? : No Does patient have P4CC services?: No  ADL Screening (condition at time of admission) Patient's cognitive ability adequate to safely complete daily activities?: Yes Patient able to express need for assistance with ADLs?: Yes Independently performs ADLs?: Yes (appropriate for developmental age)       Abuse/Neglect Assessment (Assessment to be complete while patient is alone) Abuse/Neglect Assessment Can Be Completed: Yes Physical Abuse: Denies Verbal Abuse: Denies Sexual Abuse: Denies Exploitation of patient/patient's resources: Denies Self-Neglect: Denies Values / Beliefs Cultural Requests During Hospitalization: None Spiritual Requests During Hospitalization: None Consults Spiritual Care Consult Needed: No Transition of Care Team Consult Needed: No Advance Directives (For Healthcare) Does Patient Have a Medical Advance Directive?: No Would patient like information on creating a medical advance directive?: No - Patient declined          Disposition:  Disposition Initial Assessment Completed for this Encounter: Yes Patient referred to: Other (Comment)(Consult with Psych NP)  On Site Evaluation by:   Reviewed with Physician:    Asa Saunas 01/16/2020 1:14 AM

## 2020-01-16 NOTE — ED Notes (Signed)
Pt given breakfast tray. Pt was given toothbrush to brush teeth while in bathroom for shower.

## 2020-01-16 NOTE — Plan of Care (Signed)
  Problem: Education: Goal: Emotional status will improve Outcome: Progressing Goal: Mental status will improve Outcome: Progressing   Problem: Education: Goal: Will be free of psychotic symptoms Outcome: Not Progressing Goal: Knowledge of the prescribed therapeutic regimen will improve Outcome: Progressing   Problem: Coping: Goal: Coping ability will improve Outcome: Progressing Goal: Will verbalize feelings Outcome: Progressing   Problem: Safety: Goal: Ability to remain free from injury will improve Outcome: Progressing

## 2020-01-16 NOTE — Progress Notes (Signed)
Patient remains anxious and restless. Reports inability to sleep. Reports leg pain at 2324 which he rated at a 7, for which Tylenol was provided. Patient tore down the shower curtain and shelf in his room, but is not aggressive. He was administered medication to help with anxiety and sleep. Tolerated medication without incident. Remains safe at this time with 15 minute safety checks.

## 2020-01-16 NOTE — H&P (Signed)
Psychiatric Admission Assessment Adult  Patient Identification: [name] MRN:  [id number] Date of Evaluation:  01/16/2020 Chief Complaint:  Bipolar 1 disorder (HCC) [F31.9] Principal Diagnosis: Amphetamine and psychostimulant-induced psychosis with hallucinations (HCC) Diagnosis:  Principal Problem:   Amphetamine and psychostimulant-induced psychosis with hallucinations (HCC) Active Problems:   Drug abuse (HCC)   Amphetamine abuse (HCC)   Bipolar 1 disorder (HCC)  History of Present Illness: Patient seen and chart reviewed.  Patient known from previous encounters.  44 year old man with a history of substance abuse agitation and possible bipolar disorder.  He presented to the emergency room yesterday very agitated and disorganized and endorsing several days of methamphetamine binging.  He was given enough medicine that I think he slept some last night although he is still quite agitated today.  Patient tells me that he has been out doing his usual thing which is living from house to house sometimes with family sometimes with others and that he has had about a 4-day binge of methamphetamine.  He says additionally he will take a Xanax now and then.  Those are definitely his 2 favorite drugs.  Had made suicidal statements in the emergency room which is something he has been known to do especially when intoxicated.  On interview today he says that of course he has some suicidal thoughts at times but he does not have any intention to kill himself.  Denies homicidal ideation.  He does make a few psychotic statements mentioning how he still hears voices and feels paranoid.  Patient was actually despite pressured speech and difficulty sitting still fairly cooperative and pleasant during the interview. Associated Signs/Symptoms: Depression Symptoms:  psychomotor agitation, difficulty concentrating, suicidal thoughts without plan, (Hypo) Manic Symptoms:  Elevated Mood, Flight of  Ideas, Impulsivity, Anxiety Symptoms:  Excessive Worry, Psychotic Symptoms:  Hallucinations: Auditory Paranoia, PTSD Symptoms: Negative Total Time spent with patient: 1 hour  Past Psychiatric History: Patient has a history of frequent presentation going back years with similar sorts of behavior.  Long history of amphetamine abuse.  According to the patient he was treated for ADHD when he was much younger and he continues to insist that his main problem is ADHD and that if someone were only to prescribe him Adderall everything would be fine.  I pointed out to him as I have in the past that no one is going to give him Adderall while he is actively abusing methamphetamine.  There is no known actual suicide attempts in the past.  Patient has been put on antipsychotics and other medicines in an attempt to treat bipolar disorder but has never really been compliant outside the hospital.  Is the patient at risk to self? Yes.    Has the patient been a risk to self in the past 6 months? Yes.    Has the patient been a risk to self within the distant past? Yes.    Is the patient a risk to others? No.  Has the patient been a risk to others in the past 6 months? No.  Has the patient been a risk to others within the distant past? No.   Prior Inpatient Therapy:   Prior Outpatient Therapy:    Alcohol Screening:   Substance Abuse History in the last 12 months:  Yes.   Consequences of Substance Abuse: Worsening of mood hyperactivity poor self-care estrangement from family Previous Psychotropic Medications: Yes  Psychological Evaluations: Yes  Past Medical History:  Past Medical History:  Diagnosis Date  . Amphetamine abuse (  HCC) 09/07/2017  . Anxiety   . Bipolar disorder (HCC)   . Depression   . Drug abuse (HCC)   . Hepatitis C   . Hepatitis C antibody positive in blood 09/08/2017  . Marijuana abuse 09/07/2017  . Sinus congestion     Past Surgical History:  Procedure Laterality Date  . ANKLE  SURGERY    . HERNIA REPAIR    . HERNIA REPAIR    . TENDON REPAIR    . TIBIA IM NAIL INSERTION Left 09/06/2017   Procedure: INTRAMEDULLARY (IM) NAIL TIBIAL LEFT;  Surgeon: Myrene Galas, MD;  Location: [facility] OR;  Service: Orthopedics;  Laterality: Left;   Family History:  Family History  Problem Relation Age of Onset  . Heart failure Mother    Family Psychiatric  History: Denies any Tobacco Screening:   Social History:  Social History   Substance and Sexual Activity  Alcohol Use Yes   Comment: reports weekend use (fri/sat night)     Social History   Substance and Sexual Activity  Drug Use Yes  . Frequency: 3.0 times per week  . Types: Marijuana, Methamphetamines    Additional Social History:                           Allergies:   Allergies  Allergen Reactions  . Tramadol Nausea And Vomiting   Lab Results:  Results for orders placed or performed during the hospital encounter of 01/15/20 (from the past 48 hour(s))  Urinalysis, Complete w Microscopic     Status: Abnormal   Collection Time: 01/15/20 12:05 PM  Result Value Ref Range   Color, Urine COLORLESS (A) YELLOW   APPearance CLEAR (A) CLEAR   Specific Gravity, Urine 1.002 (L) 1.005 - 1.030   pH 6.0 5.0 - 8.0   Glucose, UA NEGATIVE NEGATIVE mg/dL   Hgb urine dipstick NEGATIVE NEGATIVE   Bilirubin Urine NEGATIVE NEGATIVE   Ketones, ur NEGATIVE NEGATIVE mg/dL   Protein, ur NEGATIVE NEGATIVE mg/dL   Nitrite NEGATIVE NEGATIVE   Leukocytes,Ua NEGATIVE NEGATIVE   RBC / HPF 0-5 0 - 5 RBC/hpf   WBC, UA 0-5 0 - 5 WBC/hpf   Bacteria, UA NONE SEEN NONE SEEN   Squamous Epithelial / LPF 0-5 0 - 5    Comment: Performed at [facility], [address] [place], [place] [address]  Urine Drug Screen, Qualitative ([facility] only)     Status: Abnormal   Collection Time: 01/15/20 12:05 PM  Result Value Ref Range   Tricyclic, Ur Screen NONE DETECTED NONE DETECTED   Amphetamines, Ur Screen POSITIVE (A) NONE DETECTED    MDMA (Ecstasy)Ur Screen NONE DETECTED NONE DETECTED   Cocaine Metabolite,Ur [place] NONE DETECTED NONE DETECTED   Opiate, Ur Screen NONE DETECTED NONE DETECTED   Phencyclidine (PCP) Ur S NONE DETECTED NONE DETECTED   Cannabinoid 50 Ng, Ur [place] NONE DETECTED NONE DETECTED   Barbiturates, Ur Screen NONE DETECTED NONE DETECTED   Benzodiazepine, Ur Scrn NONE DETECTED NONE DETECTED   Methadone Scn, Ur NONE DETECTED NONE DETECTED    Comment: (NOTE) Tricyclics + metabolites, urine    Cutoff 1000 ng/mL Amphetamines + metabolites, urine  Cutoff 1000 ng/mL MDMA (Ecstasy), urine              Cutoff 500 ng/mL Cocaine Metabolite, urine          Cutoff 300 ng/mL Opiate + metabolites, urine        Cutoff 300  ng/mL Phencyclidine (PCP), urine         Cutoff 25 ng/mL Cannabinoid, urine                 Cutoff 50 ng/mL Barbiturates + metabolites, urine  Cutoff 200 ng/mL Benzodiazepine, urine              Cutoff 200 ng/mL Methadone, urine                   Cutoff 300 ng/mL The urine drug screen provides only a preliminary, unconfirmed analytical test result and should not be used for non-medical purposes. Clinical consideration and professional judgment should be applied to any positive drug screen result due to possible interfering substances. A more specific alternate chemical method must be used in order to obtain a confirmed analytical result. Gas chromatography / mass spectrometry (GC/MS) is the preferred confirmat ory method. Performed at [facility], [address] [place], [place] [address]   Lipase, blood     Status: None   Collection Time: 01/15/20  4:48 PM  Result Value Ref Range   Lipase 24 11 - 51 U/L    Comment: Performed at [facility], [address] [place], [place] [address]  Comprehensive metabolic panel     Status: Abnormal   Collection Time: 01/15/20  4:48 PM  Result Value Ref Range   Sodium 139 135 - 145 mmol/L   Potassium 4.1 3.5 - 5.1 mmol/L   Chloride  101 98 - 111 mmol/L   CO2 24 22 - 32 mmol/L   Glucose, Bld 88 70 - 99 mg/dL    Comment: Glucose reference range applies only to samples taken after fasting for at least 8 hours.   BUN 15 6 - 20 mg/dL   Creatinine, Ser 2.592.00 (H) 0.61 - 1.24 mg/dL   Calcium 56.310.1 8.9 - 87.510.3 mg/dL   Total Protein 8.2 (H) 6.5 - 8.1 g/dL   Albumin 5.1 (H) 3.5 - 5.0 g/dL   AST 45 (H) 15 - 41 U/L   ALT 64 (H) 0 - 44 U/L   Alkaline Phosphatase 57 38 - 126 U/L   Total Bilirubin 1.5 (H) 0.3 - 1.2 mg/dL   GFR calc non Af Amer 40 (L) >60 mL/min   GFR calc Af Amer 46 (L) >60 mL/min   Anion gap 14 5 - 15    Comment: Performed at [facility], [address] [place], [place] [address]  CBC     Status: Abnormal   Collection Time: 01/15/20  4:48 PM  Result Value Ref Range   WBC 20.1 (H) 4.0 - 10.5 K/uL   RBC 5.27 4.22 - 5.81 MIL/uL   Hemoglobin 15.8 13.0 - 17.0 g/dL   HCT 95.145.7 88.439.0 - 16.652.0 %   MCV 86.7 80.0 - 100.0 fL   MCH 30.0 26.0 - 34.0 pg   MCHC 34.6 30.0 - 36.0 g/dL   RDW 06.312.2 01.611.5 - 01.015.5 %   Platelets 303 150 - 400 K/uL   nRBC 0.0 0.0 - 0.2 %    Comment: Performed at [facility], [address] [place], [place] [address]  Basic metabolic panel     Status: Abnormal   Collection Time: 01/15/20 10:00 PM  Result Value Ref Range   Sodium 136 135 - 145 mmol/L   Potassium 3.9 3.5 - 5.1 mmol/L   Chloride 106 98 - 111 mmol/L   CO2 22 22 - 32 mmol/L   Glucose, Bld 108 (  H) 70 - 99 mg/dL    Comment: Glucose reference range applies only to samples taken after fasting for at least 8 hours.   BUN 18 6 - 20 mg/dL   Creatinine, Ser 8.14 (H) 0.61 - 1.24 mg/dL   Calcium 8.5 (L) 8.9 - 10.3 mg/dL   GFR calc non Af Amer 56 (L) >60 mL/min   GFR calc Af Amer >60 >60 mL/min   Anion gap 8 5 - 15    Comment: Performed at [facility], [address], [place], [place] [address]  Respiratory Panel by RT PCR (Flu A&B, Covid) - Nasopharyngeal Swab     Status: None   Collection Time: 01/16/20   3:43 AM   Specimen: Nasopharyngeal Swab  Result Value Ref Range   SARS Coronavirus 2 by RT PCR NEGATIVE NEGATIVE    Comment: (NOTE) SARS-CoV-2 target nucleic acids are NOT DETECTED. The SARS-CoV-2 RNA is generally detectable in upper respiratoy specimens during the acute phase of infection. The lowest concentration of SARS-CoV-2 viral copies this assay can detect is 131 copies/mL. A negative result does not preclude SARS-Cov-2 infection and should not be used as the sole basis for treatment or other patient management decisions. A negative result may occur with  improper specimen collection/handling, submission of specimen other than nasopharyngeal swab, presence of viral mutation(s) within the areas targeted by this assay, and inadequate number of viral copies (<131 copies/mL). A negative result must be combined with clinical observations, patient history, and epidemiological information. The expected result is Negative. Fact Sheet for Patients:  https://www.moore.com/ Fact Sheet for Healthcare Providers:  https://www.young.biz/ This test is not yet ap proved or cleared by the [place] FDA and  has been authorized for detection and/or diagnosis of SARS-CoV-2 by FDA under an Emergency Use Authorization (EUA). This EUA will remain  in effect (meaning this test can be used) for the duration of the COVID-19 declaration under Section 564(b)(1) of the Act, 21 U.S.C. section 360bbb-3(b)(1), unless the authorization is terminated or revoked sooner.    Influenza A by PCR NEGATIVE NEGATIVE   Influenza B by PCR NEGATIVE NEGATIVE    Comment: (NOTE) The Xpert Xpress SARS-CoV-2/FLU/RSV assay is intended as an aid in  the diagnosis of influenza from Nasopharyngeal swab specimens and  should not be used as a sole basis for treatment. Nasal washings and  aspirates are unacceptable for Xpert Xpress SARS-CoV-2/FLU/RSV  testing. Fact Sheet for  Patients: https://www.moore.com/ Fact Sheet for Healthcare Providers: https://www.young.biz/ This test is not yet approved or cleared by the [place] FDA and  has been authorized for detection and/or diagnosis of SARS-CoV-2 by  FDA under an Emergency Use Authorization (EUA). This EUA will remain  in effect (meaning this test can be used) for the duration of the  Covid-19 declaration under Section 564(b)(1) of the Act, 21  U.S.C. section 360bbb-3(b)(1), unless the authorization is  terminated or revoked. Performed at [facility], [address], [place], [place] [address]     Blood Alcohol level:  Lab Results  Component Value Date   [facility] <10 10/01/2019   ETH <10 08/23/2018    Metabolic Disorder Labs:  Lab Results  Component Value Date   HGBA1C 5.6 10/16/2016   MPG 114 10/16/2016   Lab Results  Component Value Date   PROLACTIN 9.8 05/16/2017   PROLACTIN 13.9 10/16/2016   Lab Results  Component Value Date   CHOL 122 05/16/2017   TRIG 101 05/16/2017   HDL 45 05/16/2017  CHOLHDL 2.7 05/16/2017   VLDL 20 05/16/2017   LDLCALC 57 05/16/2017   LDLCALC 90 10/16/2016    Current Medications: Current Facility-Administered Medications  Medication Dose Route Frequency Provider Last Rate Last Admin  . acetaminophen (TYLENOL) tablet 650 mg  650 mg Oral Q6H PRN Caroline Sauger, NP      . alum & mag hydroxide-simeth (MAALOX/MYLANTA) 200-200-20 MG/5ML suspension 30 mL  30 mL Oral Q4H PRN Caroline Sauger, NP      . magnesium hydroxide (MILK OF MAGNESIA) suspension 30 mL  30 mL Oral Daily PRN Caroline Sauger, NP      . QUEtiapine (SEROQUEL) tablet 200 mg  200 mg Oral QHS Caroline Sauger, NP       PTA Medications: Medications Prior to Admission  Medication Sig Dispense Refill Last Dose  . aspirin EC 325 MG tablet Take 1 tablet (325 mg total) by mouth daily. 30 tablet 0   . QUEtiapine (SEROQUEL) 200 MG tablet  Take 1 tablet (200 mg total) by mouth at bedtime. 30 tablet 3     Musculoskeletal: Strength & Muscle Tone: within normal limits Gait & Station: normal Patient leans: N/A  Psychiatric Specialty Exam: Physical Exam  Nursing note and vitals reviewed. Constitutional: He appears well-developed and well-nourished.  HENT:  Head: Normocephalic and atraumatic.  Eyes: Pupils are equal, round, and reactive to light. Conjunctivae are normal.  Cardiovascular: Regular rhythm and normal heart sounds.  Respiratory: Effort normal.  GI: Soft.  Musculoskeletal:        General: Normal range of motion.     Cervical back: Normal range of motion.  Neurological: He is alert.  Skin: Skin is warm and dry.  Psychiatric: His affect is labile. His speech is tangential. He is agitated. He is not aggressive. Thought content is paranoid. Cognition and memory are impaired. He expresses impulsivity and inappropriate judgment. He expresses suicidal ideation. He expresses no suicidal plans.    Review of Systems  Constitutional: Negative.   HENT: Negative.   Eyes: Negative.   Respiratory: Negative.   Cardiovascular: Negative.   Gastrointestinal: Negative.   Musculoskeletal: Negative.   Skin: Negative.   Neurological: Negative.   Psychiatric/Behavioral: Positive for confusion and sleep disturbance. The patient is nervous/anxious.     There were no vitals taken for this visit.There is no height or weight on file to calculate BMI.  General Appearance: Casual  Eye Contact:  Good  Speech:  Pressured  Volume:  Normal  Mood:  Dysphoric and Irritable  Affect:  Congruent  Thought Process:  Disorganized  Orientation:  Full (Time, Place, and Person)  Thought Content:  Illogical, Delusions, Hallucinations: Auditory and Paranoid Ideation  Suicidal Thoughts:  Yes.  without intent/plan  Homicidal Thoughts:  No  Memory:  Immediate;   Fair Recent;   Fair Remote;   Fair  Judgement:  Fair  Insight:  Fair  Psychomotor  Activity:  Increased  Concentration:  Concentration: Poor  Recall:  [employer] of Knowledge:  Fair  Language:  Fair  Akathisia:  No  Handed:  Right  AIMS (if indicated):     Assets:  Desire for Improvement Physical Health Resilience  ADL's:  Impaired  Cognition:  Impaired,  Mild  Sleep:       Treatment Plan Summary: Daily contact with patient to assess and evaluate symptoms and progress in treatment, Medication management and Plan Because he was so acutely agitated I ordered a injection with Ativan and Haldol and Benadryl which she was agreeable to.  We will try some sedating antipsychotic such as Zyprexa at nighttime.  Patient will be reassessed tomorrow.  I would hope that within a day or so he will of calm down enough that we can get a clear idea of whether he has persisting psychotic symptoms.  Observation Level/Precautions:  15 minute checks  Laboratory:  Chemistry Profile  Psychotherapy:    Medications:    Consultations:    Discharge Concerns:    Estimated LOS:  Other:     Physician Treatment Plan for Primary Diagnosis: Amphetamine and psychostimulant-induced psychosis with hallucinations (HCC) Long Term Goal(s): Improvement in symptoms so as ready for discharge  Short Term Goals: Ability to disclose and discuss suicidal ideas and Ability to demonstrate self-control will improve  Physician Treatment Plan for Secondary Diagnosis: Principal Problem:   Amphetamine and psychostimulant-induced psychosis with hallucinations (HCC) Active Problems:   Drug abuse (HCC)   Amphetamine abuse (HCC)   Bipolar 1 disorder (HCC)  Long Term Goal(s): Improvement in symptoms so as ready for discharge  Short Term Goals: Compliance with prescribed medications will improve  I certify that inpatient services furnished can reasonably be expected to improve the patient's condition.    Mordecai Rasmussen, MD 4/13/20214:40 PM

## 2020-01-16 NOTE — ED Notes (Signed)
Pt up to bathroom.

## 2020-01-16 NOTE — ED Notes (Signed)
IVC/PENDING PSYCH ADMIT WHEN MEDICALLY CLEARED 

## 2020-01-16 NOTE — [facility] Group Notes (Signed)
LCSW Group Therapy Note  01/16/2020 3:03 PM  Type of Therapy/Topic:  Group Therapy:  Feelings about Diagnosis  Participation Level:  Did Not Attend   Description of Group:   This group will allow patients to explore their thoughts and feelings about diagnoses they have received. Patients will be guided to explore their level of understanding and acceptance of these diagnoses. Facilitator will encourage patients to process their thoughts and feelings about the reactions of others to their diagnosis and will guide patients in identifying ways to discuss their diagnosis with significant others in their lives. This group will be process-oriented, with patients participating in exploration of their own experiences, giving and receiving support, and processing challenge from other group members.   Therapeutic Goals: 1. Patient will demonstrate understanding of diagnosis as evidenced by identifying two or more symptoms of the disorder 2. Patient will be able to express two feelings regarding the diagnosis 3. Patient will demonstrate their ability to communicate their needs through discussion and/or role play  Summary of Patient Progress: X  Therapeutic Modalities:   Cognitive Behavioral Therapy Brief Therapy Feelings Identification   Peony Barner, MSW, LCSW 01/16/2020 3:03 PM   

## 2020-01-16 NOTE — Progress Notes (Signed)
Pt refused meds. MD notified. Torrie Mayers RN

## 2020-01-16 NOTE — Progress Notes (Signed)
Pt has been irritable and cussing. He yelled in his room a few times but then clamed down. He is very fidgety and restless. Torrie Mayers RN

## 2020-01-16 NOTE — ED Notes (Signed)
Pt given supplies to take a shower. Pt is able to shower independently.  

## 2020-01-16 NOTE — Progress Notes (Signed)
Patient alert and oriented. Cooperative and in a much better mood. Patient observed interacting well with other peers.Patient denied SI/HI/AVH and depression. He does endorse anxiety and rated his anxiety level at 7.  Patient accepted and received ordered medications. Patient tolerated medication without incident. Patient informed to contact staff with any questions and concerns. Patient remains safe with 15 minute safety checks.

## 2020-01-16 NOTE — ED Notes (Signed)
Lunch tray given to pt.

## 2020-01-16 NOTE — ED Notes (Signed)
Pt made first phone call today. 

## 2020-01-16 NOTE — ED Notes (Signed)
All belongings sent down to [facility]

## 2020-01-17 NOTE — Progress Notes (Signed)
Pt is still sleeping. When he wakes I will offer him some breakfast. Torrie Mayers RN

## 2020-01-17 NOTE — BHH Counselor (Signed)
CSW attempted to meet with pt to complete PSA but pt was asleep and did not wake when his name was called several times.

## 2020-01-17 NOTE — Progress Notes (Signed)
Recreation Therapy Notes  Date: 01/17/2020  Time: 9:30 am   Location: Craft room   Behavioral response: N/A   Intervention Topic: Values   Discussion/Intervention: Patient did not attend group.   Clinical Observations/Feedback:  Patient did not attend group.   Desera Graffeo LRT/CTRS         Makailyn Mccormick 01/17/2020 12:12 PM 

## 2020-01-17 NOTE — Plan of Care (Signed)
Pt admits to having depression, anxiety and SI wout a plan. Pt is respond to internal stimuli. Pt is agitated and irritable. Pt ate his dinner and is resting in his room. Torrie Mayers RN Problem: Education: Goal: Knowledge of [place] [facility] Education information/materials will improve Outcome: Not Progressing Goal: Emotional status will improve Outcome: Not Progressing Goal: Mental status will improve Outcome: Not Progressing Goal: Verbalization of understanding the information provided will improve Outcome: Not Progressing   Problem: Safety: Goal: Periods of time without injury will increase Outcome: Progressing   Problem: Physical Regulation: Goal: Complications related to the disease process, condition or treatment will be avoided or minimized Outcome: Not Progressing   Problem: Safety: Goal: Ability to remain free from injury will improve Outcome: Not Progressing   Problem: Safety: Goal: Ability to remain free from injury will improve Outcome: Not Progressing   Problem: Education: Goal: Will be free of psychotic symptoms Outcome: Not Progressing Goal: Knowledge of the prescribed therapeutic regimen will improve Outcome: Not Progressing   Problem: Coping: Goal: Coping ability will improve Outcome: Not Progressing Goal: Will verbalize feelings Outcome: Not Progressing   Problem: Safety: Goal: Ability to remain free from injury will improve Outcome: Progressing

## 2020-01-17 NOTE — Progress Notes (Signed)
Recreation Therapy Notes  INPATIENT RECREATION THERAPY ASSESSMENT  Patient Details Name: [name] MRN: [id number] DOB: 29-Aug-1976 Today's Date: 01/17/2020       Information Obtained From: Patient(Patient refused)  Able to Participate in Assessment/Interview:    Patient Presentation:    Reason for Admission (Per Patient):    Patient Stressors:    Coping Skills:      Leisure Interests (2+):     Frequency of Recreation/Participation:    Awareness of Community Resources:     [employer]:     Current Use:    If no, Barriers?:    Expressed Interest in [employer] Information:    [place] of Residence:     Patient Main Form of Transportation:    Patient Strengths:     Patient Identified Areas of Improvement:     Patient Goal for Hospitalization:     Current SI (including self-harm):     Current HI:     Current AVH:    Staff Intervention Plan:    Consent to Intern Participation:    [name] 01/17/2020, 4:19 PM

## 2020-01-17 NOTE — Progress Notes (Signed)
[facility] MD Progress Note  01/17/2020 5:21 PM [name]  MRN:  [id number] Subjective: Patient seen chart reviewed.  [name] was up most of last night agitated hyperactive and required multiple doses of benzodiazepine and antipsychotic before he finally settled down.  At that point he fell asleep and he has stayed asleep essentially all day today.  He is arousable and appears to be comfortable and when he does talk he remains disorganized and confused but at least he has not been aggressive.  No apparent side effects of medicine.  He was not able to participate in any group therapy assessments otherwise today Principal Problem: Amphetamine and psychostimulant-induced psychosis with hallucinations (HCC) Diagnosis: Principal Problem:   Amphetamine and psychostimulant-induced psychosis with hallucinations (HCC) Active Problems:   Drug abuse (HCC)   Amphetamine abuse (HCC)   Bipolar 1 disorder (HCC)  Total Time spent with patient: 30 minutes  Past Psychiatric History: Past history of recurrent episodes of agitation and psychosis combining elements of both bipolar disorder or schizoaffective disorder and substance abuse  Past Medical History:  Past Medical History:  Diagnosis Date  . 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 Anxiety   . Bipolar disorder (HCC)   . Depression   . Drug abuse (HCC)   . Hepatitis C   . Hepatitis C antibody positive in blood 09/08/2017  . Marijuana abuse 09/07/2017  . Sinus congestion     Past Surgical History:  Procedure Laterality Date  . ANKLE SURGERY    . HERNIA REPAIR    . HERNIA REPAIR    . TENDON REPAIR    . TIBIA IM NAIL INSERTION Left 09/06/2017   Procedure: INTRAMEDULLARY (IM) NAIL TIBIAL LEFT;  Surgeon: Myrene Galas, MD;  Location: [facility] OR;  Service: Orthopedics;  Laterality: Left;   Family History:  Family History  Problem Relation Age of Onset  . Heart failure Mother    Family Psychiatric  History:.  Notably his father had  schizophrenia Social History:  Social History   Substance and Sexual Activity  Alcohol Use Yes   Comment: reports weekend use (fri/sat night)     Social History   Substance and Sexual Activity  Drug Use Yes  . Frequency: 3.0 times per week  . Types: Marijuana, Methamphetamines    Social History   Socioeconomic History  . Marital status: Single    Spouse name: Not on file  . Number of children: Not on file  . Years of education: Not on file  . Highest education level: Not on file  Occupational History  . Not on file  Tobacco Use  . Smoking status: Current Every Day Smoker    Packs/day: 1.00    Types: Cigarettes  . Smokeless tobacco: Current User  Substance and Sexual Activity  . Alcohol use: Yes    Comment: reports weekend use (fri/sat night)  . Drug use: Yes    Frequency: 3.0 times per week    Types: Marijuana, Methamphetamines  . Sexual activity: Not on file  Other Topics Concern  . Not on file  Social History Narrative   ** Merged History Encounter **       Social Determinants of Health   Financial Resource Strain:   . Difficulty of Paying Living Expenses:   Food Insecurity:   . Worried About Programme researcher, broadcasting/film/video in the Last Year:   . Barista in the Last Year:   Transportation Needs:   . Freight forwarder (Medical):   Marland Kitchen  Lack of Transportation (Non-Medical):   Physical Activity:   . Days of Exercise per Week:   . Minutes of Exercise per Session:   Stress:   . Feeling of Stress :   Social Connections:   . Frequency of Communication with Friends and Family:   . Frequency of Social Gatherings with Friends and Family:   . Attends Religious Services:   . Active Member of Clubs or Organizations:   . Attends Banker Meetings:   Marland Kitchen Marital Status:    Additional Social History:                         Sleep: Good  Appetite:  Fair  Current Medications: Current Facility-Administered Medications  Medication Dose Route  Frequency Provider Last Rate Last Admin  . acetaminophen (TYLENOL) tablet 650 mg  650 mg Oral Q6H PRN Gillermo Murdoch, NP   650 mg at 01/16/20 2324  . alum & mag hydroxide-simeth (MAALOX/MYLANTA) 200-200-20 MG/5ML suspension 30 mL  30 mL Oral Q4H PRN Gillermo Murdoch, NP      . haloperidol lactate (HALDOL) injection 5 mg  5 mg Intramuscular Once Kam Kushnir T, MD      . LORazepam (ATIVAN) injection 2 mg  2 mg Intramuscular Q6H PRN Maxwell Martorano, Jackquline Denmark, MD   2 mg at 01/16/20 2142  . magnesium hydroxide (MILK OF MAGNESIA) suspension 30 mL  30 mL Oral Daily PRN Gillermo Murdoch, NP      . QUEtiapine (SEROQUEL) tablet 100 mg  100 mg Oral QHS Edmund Rick, Jackquline Denmark, MD   100 mg at 01/16/20 2132    Lab Results:  Results for orders placed or performed during the hospital encounter of 01/15/20 (from the past 48 hour(s))  Basic metabolic panel     Status: Abnormal   Collection Time: 01/15/20 10:00 PM  Result Value Ref Range   Sodium 136 135 - 145 mmol/L   Potassium 3.9 3.5 - 5.1 mmol/L   Chloride 106 98 - 111 mmol/L   CO2 22 22 - 32 mmol/L   Glucose, Bld 108 (H) 70 - 99 mg/dL    Comment: Glucose reference range applies only to samples taken after fasting for at least 8 hours.   BUN 18 6 - 20 mg/dL   Creatinine, Ser 8.03 (H) 0.61 - 1.24 mg/dL   Calcium 8.5 (L) 8.9 - 10.3 mg/dL   GFR calc non Af Amer 56 (L) >60 mL/min   GFR calc Af Amer >60 >60 mL/min   Anion gap 8 5 - 15    Comment: Performed at [facility], [address], [place], [place] [address]  Respiratory Panel by RT PCR (Flu A&B, Covid) - Nasopharyngeal Swab     Status: None   Collection Time: 01/16/20  3:43 AM   Specimen: Nasopharyngeal Swab  Result Value Ref Range   SARS Coronavirus 2 by RT PCR NEGATIVE NEGATIVE    Comment: (NOTE) SARS-CoV-2 target nucleic acids are NOT DETECTED. The SARS-CoV-2 RNA is generally detectable in upper respiratoy specimens during the acute phase of infection. The lowest concentration of  SARS-CoV-2 viral copies this assay can detect is 131 copies/mL. A negative result does not preclude SARS-Cov-2 infection and should not be used as the sole basis for treatment or other patient management decisions. A negative result may occur with  improper specimen collection/handling, submission of specimen other than nasopharyngeal swab, presence of viral mutation(s) within the areas targeted by this assay, and inadequate number  of viral copies (<131 copies/mL). A negative result must be combined with clinical observations, patient history, and epidemiological information. The expected result is Negative. Fact Sheet for Patients:  PinkCheek.be Fact Sheet for Healthcare Providers:  GravelBags.it This test is not yet ap proved or cleared by the [place] FDA and  has been authorized for detection and/or diagnosis of SARS-CoV-2 by FDA under an Emergency Use Authorization (EUA). This EUA will remain  in effect (meaning this test can be used) for the duration of the COVID-19 declaration under Section 564(b)(1) of the Act, 21 U.S.C. section 360bbb-3(b)(1), unless the authorization is terminated or revoked sooner.    Influenza A by PCR NEGATIVE NEGATIVE   Influenza B by PCR NEGATIVE NEGATIVE    Comment: (NOTE) The Xpert Xpress SARS-CoV-2/FLU/RSV assay is intended as an aid in  the diagnosis of influenza from Nasopharyngeal swab specimens and  should not be used as a sole basis for treatment. Nasal washings and  aspirates are unacceptable for Xpert Xpress SARS-CoV-2/FLU/RSV  testing. Fact Sheet for Patients: PinkCheek.be Fact Sheet for Healthcare Providers: GravelBags.it This test is not yet approved or cleared by the [place] FDA and  has been authorized for detection and/or diagnosis of SARS-CoV-2 by  FDA under an Emergency Use Authorization (EUA). This EUA will  remain  in effect (meaning this test can be used) for the duration of the  Covid-19 declaration under Section 564(b)(1) of the Act, 21  U.S.C. section 360bbb-3(b)(1), unless the authorization is  terminated or revoked. Performed at [facility], [address] [place], [place] [address]     Blood Alcohol level:  Lab Results  Component Value Date   [facility] <10 10/01/2019   ETH <10 22/48/2500    Metabolic Disorder Labs: Lab Results  Component Value Date   HGBA1C 5.6 10/16/2016   MPG 114 10/16/2016   Lab Results  Component Value Date   PROLACTIN 9.8 05/16/2017   PROLACTIN 13.9 10/16/2016   Lab Results  Component Value Date   CHOL 122 05/16/2017   TRIG 101 05/16/2017   HDL 45 05/16/2017   CHOLHDL 2.7 05/16/2017   VLDL 20 05/16/2017   LDLCALC 57 05/16/2017   LDLCALC 90 10/16/2016    Physical Findings: AIMS:  , ,  ,  ,    CIWA:    COWS:     Musculoskeletal: Strength & Muscle Tone: within normal limits Gait & Station: unsteady Patient leans: N/A  Psychiatric Specialty Exam: Physical Exam  Nursing note and vitals reviewed. Constitutional: He appears well-developed and well-nourished.  HENT:  Head: Normocephalic and atraumatic.  Eyes: Pupils are equal, round, and reactive to light. Conjunctivae are normal.  Cardiovascular: Regular rhythm and normal heart sounds.  Respiratory: Effort normal. No respiratory distress.  GI: Soft.  Musculoskeletal:        General: Normal range of motion.     Cervical back: Normal range of motion.  Neurological: He is alert.  Skin: Skin is warm and dry.  Psychiatric: His affect is labile. His speech is rapid and/or pressured. He is agitated. Thought content is paranoid and delusional. Cognition and memory are impaired. He expresses impulsivity.    Review of Systems  Constitutional: Negative.   HENT: Negative.   Eyes: Negative.   Respiratory: Negative.   Cardiovascular: Negative.   Gastrointestinal: Negative.    Musculoskeletal: Negative.   Skin: Negative.   Neurological: Negative.   Psychiatric/Behavioral: Positive for agitation, behavioral problems, dysphoric mood, hallucinations, sleep disturbance and suicidal ideas. The patient  is nervous/anxious and is hyperactive.     Blood pressure 110/72, pulse (!) 103, temperature 97.7 F (36.5 C), temperature source Oral, resp. rate 18, height 6\' 1"  (1.854 m), weight 78.5 kg, SpO2 95 %.Body mass index is 22.82 kg/m.  General Appearance: Disheveled  Eye Contact:  Minimal  Speech:  Slurred  Volume:  Decreased  Mood:  Euthymic  Affect:  Constricted  Thought Process:  Disorganized  Orientation:  Negative  Thought Content:  Illogical  Suicidal Thoughts:  No  Homicidal Thoughts:  No  Memory:  Immediate;   Fair Recent;   Poor Remote;   Poor  Judgement:  Impaired  Insight:  Shallow  Psychomotor Activity:  Decreased  Concentration:  Concentration: Poor  Recall:  Poor  Fund of Knowledge:  Poor  Language:  Poor  Akathisia:  No  Handed:  Right  AIMS (if indicated):     Assets:  Desire for Improvement Physical Health Resilience  ADL's:  Impaired  Cognition:  Impaired,  Mild  Sleep:  Number of Hours: 4.45     Treatment Plan Summary: Daily contact with patient to assess and evaluate symptoms and progress in treatment, Medication management and Plan Patient certainly needed to sleep after having been up for several days.  I am worried however that his nap throughout the day today means he might be up all night tonight.  It might take a while for to get this settled out.  Patient is still on orders for standing antipsychotic medication at night.  Hopefully he will be up during the day tomorrow and we can work on more assessment of his current mental state and discharge planning  [place], MD 01/17/2020, 5:21 PM

## 2020-01-17 NOTE — [facility] Group Notes (Signed)
Emotional Regulation 01/17/2020 1PM  Type of Therapy/Topic:  Group Therapy:  Emotion Regulation  Participation Level:  Did Not Attend   Description of Group:   The purpose of this group is to assist patients in learning to regulate negative emotions and experience positive emotions. Patients will be guided to discuss ways in which they have been vulnerable to their negative emotions. These vulnerabilities will be juxtaposed with experiences of positive emotions or situations, and patients will be challenged to use positive emotions to combat negative ones. Special emphasis will be placed on coping with negative emotions in conflict situations, and patients will process healthy conflict resolution skills.  Therapeutic Goals: 1. Patient will identify two positive emotions or experiences to reflect on in order to balance out negative emotions 2. Patient will label two or more emotions that they find the most difficult to experience 3. Patient will demonstrate positive conflict resolution skills through discussion and/or role plays  Summary of Patient Progress:       Therapeutic Modalities:   Cognitive Behavioral Therapy Feelings Identification Dialectical Behavioral Therapy   Suzan Slick, LCSW 01/17/2020 2:11 PM

## 2020-01-17 NOTE — [facility] Group Notes (Signed)
[facility] Group Notes:  (Nursing/MHT/Case Management/Adjunct)  Date:  01/17/2020  Time:  9:49 PM  Type of Therapy:  Group Therapy  Participation Level:  Did Not Attend     [name] 01/17/2020, 9:49 PM

## 2020-01-17 NOTE — Progress Notes (Signed)
When the pt woke up I got him a Gatorade, bottled water, PB crackers, banana and nutriigrain bar. Torrie Mayers RN

## 2020-01-18 DIAGNOSIS — F15951 Other stimulant use, unspecified with stimulant-induced psychotic disorder with hallucinations: Secondary | ICD-10-CM | POA: Diagnosis not present

## 2020-01-18 MED ORDER — QUETIAPINE FUMARATE 200 MG PO TABS
200.0000 mg | ORAL_TABLET | Freq: Every day | ORAL | Status: DC
  Administered 2020-01-18: 200 mg via ORAL
  Filled 2020-01-18: qty 1

## 2020-01-18 NOTE — Progress Notes (Signed)
[facility] MD Progress Note  01/18/2020 4:50 PM [name]  MRN:  [id number] Subjective: Follow-up for this 44 year old man with substance abuse and bipolar disorder.  Patient slept all day yesterday but also stayed mostly asleep during the night.  Today he tells me that he is feeling terrible.  He will not clarify anything more about it just saying that he feels awful.  Does not make much eye contact.  Looks very depressed.  Endorses suicidal thoughts without any acute intent or plan.  Patient is only eating and drinking a little bit mostly staying in bed. Principal Problem: Amphetamine and psychostimulant-induced psychosis with hallucinations (HCC) Diagnosis: Principal Problem:   Amphetamine and psychostimulant-induced psychosis with hallucinations (HCC) Active Problems:   Drug abuse (HCC)   Amphetamine abuse (HCC)   Bipolar 1 disorder (HCC)  Total Time spent with patient: 30 minutes  Past Psychiatric History: Past history of bipolar disorder complicated by heavy substance abuse  Past Medical History:  Past Medical History:  Diagnosis Date  . 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 Anxiety   . Bipolar disorder (HCC)   . Depression   . Drug abuse (HCC)   . Hepatitis C   . Hepatitis C antibody positive in blood 09/08/2017  . Marijuana abuse 09/07/2017  . Sinus congestion     Past Surgical History:  Procedure Laterality Date  . ANKLE SURGERY    . HERNIA REPAIR    . HERNIA REPAIR    . TENDON REPAIR    . TIBIA IM NAIL INSERTION Left 09/06/2017   Procedure: INTRAMEDULLARY (IM) NAIL TIBIAL LEFT;  Surgeon: Myrene Galas, MD;  Location: [facility] OR;  Service: Orthopedics;  Laterality: Left;   Family History:  Family History  Problem Relation Age of Onset  . Heart failure Mother    Family Psychiatric  History: See previous Social History:  Social History   Substance and Sexual Activity  Alcohol Use Yes   Comment: reports weekend use (fri/sat night)     Social History   Substance and  Sexual Activity  Drug Use Yes  . Frequency: 3.0 times per week  . Types: Marijuana, Methamphetamines    Social History   Socioeconomic History  . Marital status: Single    Spouse name: Not on file  . Number of children: Not on file  . Years of education: Not on file  . Highest education level: Not on file  Occupational History  . Not on file  Tobacco Use  . Smoking status: Current Every Day Smoker    Packs/day: 1.00    Types: Cigarettes  . Smokeless tobacco: Current User  Substance and Sexual Activity  . Alcohol use: Yes    Comment: reports weekend use (fri/sat night)  . Drug use: Yes    Frequency: 3.0 times per week    Types: Marijuana, Methamphetamines  . Sexual activity: Not on file  Other Topics Concern  . Not on file  Social History Narrative   ** Merged History Encounter **       Social Determinants of Health   Financial Resource Strain:   . Difficulty of Paying Living Expenses:   Food Insecurity:   . Worried About Programme researcher, broadcasting/film/video in the Last Year:   . Barista in the Last Year:   Transportation Needs:   . Freight forwarder (Medical):   Marland Kitchen Lack of Transportation (Non-Medical):   Physical Activity:   . Days of Exercise per Week:   . Minutes of Exercise  per Session:   Stress:   . Feeling of Stress :   Social Connections:   . Frequency of Communication with Friends and Family:   . Frequency of Social Gatherings with Friends and Family:   . Attends Religious Services:   . Active Member of Clubs or Organizations:   . Attends Archivist Meetings:   Marland Kitchen Marital Status:    Additional Social History:                         Sleep: Fair  Appetite:  Fair  Current Medications: Current Facility-Administered Medications  Medication Dose Route Frequency Provider Last Rate Last Admin  . acetaminophen (TYLENOL) tablet 650 mg  650 mg Oral Q6H PRN Caroline Sauger, NP   650 mg at 01/16/20 2324  . alum & mag hydroxide-simeth  (MAALOX/MYLANTA) 200-200-20 MG/5ML suspension 30 mL  30 mL Oral Q4H PRN Caroline Sauger, NP      . haloperidol lactate (HALDOL) injection 5 mg  5 mg Intramuscular Once Aundre Hietala T, MD      . LORazepam (ATIVAN) injection 2 mg  2 mg Intramuscular Q6H PRN Dellanira Dillow, Madie Reno, MD   2 mg at 01/16/20 2142  . magnesium hydroxide (MILK OF MAGNESIA) suspension 30 mL  30 mL Oral Daily PRN Caroline Sauger, NP      . QUEtiapine (SEROQUEL) tablet 200 mg  200 mg Oral QHS Cranford Blessinger T, MD        Lab Results: No results found for this or any previous visit (from the past 48 hour(s)).  Blood Alcohol level:  Lab Results  Component Value Date   ETH <10 10/01/2019   ETH <10 05/39/7673    Metabolic Disorder Labs: Lab Results  Component Value Date   HGBA1C 5.6 10/16/2016   MPG 114 10/16/2016   Lab Results  Component Value Date   PROLACTIN 9.8 05/16/2017   PROLACTIN 13.9 10/16/2016   Lab Results  Component Value Date   CHOL 122 05/16/2017   TRIG 101 05/16/2017   HDL 45 05/16/2017   CHOLHDL 2.7 05/16/2017   VLDL 20 05/16/2017   LDLCALC 57 05/16/2017   LDLCALC 90 10/16/2016    Physical Findings: AIMS:  , ,  ,  ,    CIWA:    COWS:     Musculoskeletal: Strength & Muscle Tone: within normal limits Gait & Station: normal Patient leans: N/A  Psychiatric Specialty Exam: Physical Exam  Nursing note and vitals reviewed. Constitutional: He appears well-developed and well-nourished.  HENT:  Head: Normocephalic and atraumatic.  Eyes: Pupils are equal, round, and reactive to light. Conjunctivae are normal.  Cardiovascular: Regular rhythm and normal heart sounds.  Respiratory: Effort normal. No respiratory distress.  GI: Soft.  Musculoskeletal:        General: Normal range of motion.     Cervical back: Normal range of motion.  Neurological: He is alert.  Skin: Skin is warm and dry.  Psychiatric: His affect is blunt. His speech is delayed. He is slowed and withdrawn. Cognition  and memory are impaired. He expresses inappropriate judgment. He exhibits a depressed mood. He expresses suicidal ideation. He expresses no suicidal plans.    Review of Systems  Constitutional: Negative.   HENT: Negative.   Eyes: Negative.   Respiratory: Negative.   Cardiovascular: Negative.   Gastrointestinal: Negative.   Musculoskeletal: Negative.   Skin: Negative.   Neurological: Negative.   Psychiatric/Behavioral: Positive for confusion, decreased concentration, dysphoric mood and  suicidal ideas.    Blood pressure 110/72, pulse (!) 103, temperature 97.7 F (36.5 C), temperature source Oral, resp. rate 18, height 6\' 1"  (1.854 m), weight 78.5 kg, SpO2 95 %.Body mass index is 22.82 kg/m.  General Appearance: Casual  Eye Contact:  None  Speech:  Slow  Volume:  Decreased  Mood:  Depressed  Affect:  Constricted  Thought Process:  Disorganized  Orientation:  Negative  Thought Content:  Illogical  Suicidal Thoughts:  Yes.  without intent/plan  Homicidal Thoughts:  No  Memory:  Immediate;   Fair Recent;   Fair Remote;   Fair  Judgement:  Impaired  Insight:  Shallow  Psychomotor Activity:  Decreased  Concentration:  Concentration: Poor  Recall:  Poor  Fund of Knowledge:  Poor  Language:  Poor  Akathisia:  No  Handed:  Right  AIMS (if indicated):     Assets:  Physical Health Resilience  ADL's:  Impaired  Cognition:  Impaired,  Mild  Sleep:  Number of Hours: 8.15     Treatment Plan Summary: Daily contact with patient to assess and evaluate symptoms and progress in treatment, Medication management and Plan Part of this may be just the recovery from days of not sleeping and binging on methamphetamine and part of it may be crashing into a depressive episode.  Encourage patient strongly to get up out of bed and be more interactive on the unit.  I am going to increase his Seroquel to 200 mg at night and hopefully if he is not agitated we can limit the amount of extra sedating  medicine that he gets.  , MD 01/18/2020, 4:50 PM

## 2020-01-18 NOTE — [facility] Group Notes (Signed)
[facility] Group Notes:  (Nursing/MHT/Case Management/Adjunct)  Date:  01/18/2020  Time:  8:53 PM  Type of Therapy:  Group Therapy  Participation Level:  Did Not Attend  Marketta Valadez L Tinsley Everman 01/18/2020, 8:53 PM 

## 2020-01-18 NOTE — Progress Notes (Signed)
Recreation Therapy Notes  Date: 01/18/2020  Time: 9:30 am   Location: Craft room   Behavioral response: N/A   Intervention Topic: Problem-Solving    Discussion/Intervention: Patient did not attend group.   Clinical Observations/Feedback:  Patient did not attend group.   Kamdyn Colborn LRT/CTRS        Kareemah Grounds 01/18/2020 11:26 AM

## 2020-01-18 NOTE — Progress Notes (Signed)
Pt was cooperative to come down to the day room for a fire drill however it took some assistance from security. He had not been out of his room all day. I offered him some food and beverage but he denied. Torrie Mayers RN

## 2020-01-18 NOTE — Progress Notes (Signed)
Pt would not attend treatment team today because he felt "awful". I assessed him and offered him breakfast but he refused. Torrie Mayers RN

## 2020-01-18 NOTE — [facility] Group Notes (Signed)
LCSW Group Therapy Note   01/18/2020 12:38 PM   Type of Therapy and Topic:  Group Therapy:  Overcoming Obstacles   Participation Level:  Did Not Attend   Description of Group:    In this group patients will be encouraged to explore what they see as obstacles to their own wellness and recovery. They will be guided to discuss their thoughts, feelings, and behaviors related to these obstacles. The group will process together ways to cope with barriers, with attention given to specific choices patients can make. Each patient will be challenged to identify changes they are motivated to make in order to overcome their obstacles. This group will be process-oriented, with patients participating in exploration of their own experiences as well as giving and receiving support and challenge from other group members.   Therapeutic Goals: 1. Patient will identify personal and current obstacles as they relate to admission. 2. Patient will identify barriers that currently interfere with their wellness or overcoming obstacles.  3. Patient will identify feelings, thought process and behaviors related to these barriers. 4. Patient will identify two changes they are willing to make to overcome these obstacles:      Summary of Patient Progress x     Therapeutic Modalities:   Cognitive Behavioral Therapy Solution Focused Therapy Motivational Interviewing Relapse Prevention Therapy  Aubriegh Minch, MSW, LCSW Clinical Social Work 01/18/2020 12:38 PM   

## 2020-01-18 NOTE — Tx Team (Addendum)
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 acetaminophen (TYLENOL) tablet 650 mg  650 mg Oral Q6H PRN Caroline Sauger, NP   650 mg at 01/16/20 2324  . alum & mag hydroxide-simeth (MAALOX/MYLANTA) 200-200-20 MG/5ML suspension 30 mL  30 mL Oral Q4H PRN Caroline Sauger, NP      . haloperidol lactate (HALDOL) injection 5 mg  5 mg Intramuscular Once Clapacs, John T, MD      . LORazepam (ATIVAN) injection 2 mg  2 mg Intramuscular Q6H PRN Clapacs, Madie Reno, MD   2 mg at 01/16/20 2142  . magnesium hydroxide (MILK OF MAGNESIA) suspension 30 mL  30 mL Oral Daily PRN Caroline Sauger, NP      . QUEtiapine (SEROQUEL) tablet 100 mg  100 mg Oral QHS Clapacs, Madie Reno, MD   100 mg at 01/17/20 2223   PTA Medications: Medications Prior to Admission  Medication Sig Dispense Refill Last Dose  . aspirin EC 325 MG tablet Take 1 tablet (325 mg total) by mouth daily. 30 tablet 0   . QUEtiapine (SEROQUEL) 200 MG tablet Take 1 tablet (200 mg total) by mouth at bedtime. 30 tablet 3     Patient Stressors: Financial difficulties Marital or family conflict Substance abuse  Patient Strengths: General fund of knowledge Physical Health  Treatment Modalities: Medication Management, Group therapy, Case management,  1 to 1 session with clinician, Psychoeducation, Recreational therapy.   Physician Treatment Plan for Primary Diagnosis: Amphetamine and psychostimulant-induced psychosis with  hallucinations ([place]) Long Term Goal(s): Improvement in symptoms so as ready for discharge Improvement in symptoms so as ready for discharge   Short Term Goals: Ability to disclose and discuss suicidal ideas Ability to demonstrate self-control will improve Compliance with prescribed medications will improve  Medication Management: Evaluate patient's response, side effects, and tolerance of medication regimen.  Therapeutic Interventions: 1 to 1 sessions, Unit Group sessions and Medication administration.  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  Therapeutic Interventions: 1 to 1 sessions, Unit Group sessions and Medication administration.  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 RN will report any adverse and/or side effects to prescribing provider.  Therapeutic Interventions: 1 on 1 counseling sessions, Psychoeducation, Medication administration, Evaluate responses to treatment, Monitor vital signs and CBGs as ordered, Perform/monitor CIWA, COWS, AIMS and Fall Risk screenings as ordered, Perform wound care treatments as ordered.  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  Short Term Goals: Engage patient in aftercare planning with referrals and resources, Increase ability to appropriately verbalize feelings, Facilitate acceptance of mental health diagnosis and concerns and Increase skills for wellness and recovery  Therapeutic Interventions: Assess for all discharge needs, 1 to 1 time with Social worker, Explore available resources and support systems, Assess for adequacy in community support network, Educate family and significant other(s) on suicide prevention, Complete Psychosocial Assessment, Interpersonal group therapy.  Evaluation of Outcomes: Not Met   Progress in Treatment: Attending groups: No. Participating in groups: No. Taking medication as prescribed: Yes. Toleration medication: Yes. Family/Significant other contact made: No, will contact:  when consent given Patient understands diagnosis: No. Discussing patient identified problems/goals with staff: No. Medical problems stabilized or resolved: Yes. Denies suicidal/homicidal ideation: No. Issues/concerns per patient self-inventory: No. Other: N/A  New problem(s) identified: No, Describe:  none  New Short Term/Long Term Goal(s): Detox, elimination of AVH/symptoms of psychosis, medication management for  mood stabilization; elimination of SI thoughts; development of comprehensive mental wellness/sobriety plan.   Patient Goals:  Patient invited to treatment team and refused to attend. No goal obtained  Discharge Plan or Barriers: SPE pamphlet, Mobile Crisis information, and AA/NA information provided to patient for additional community support and resources. CSW assessing for appropriate referrals. At this time pt has been refusing to speak with CSW staff and complete PSA.  Reason for Continuation of Hospitalization: Medication stabilization  Estimated Length of Stay: 3-5 days  Recreational Therapy: Patient Stressors: N/A Patient Goal: Patient will engage in groups without prompting or encouragement from LRT x3 group sessions within 5 recreation therapy group sessions   Attendees: Patient:  01/18/2020 2:33 PM  Physician:  Dr Weber Cooks MD 01/18/2020 2:33 PM  Nursing: Collier Bullock RN 01/18/2020 2:33 PM  RN Care Manager: 01/18/2020 2:33 PM  Social Worker: Minette Brine Moton LCSW 01/18/2020 2:33 PM  Recreational Therapist: Roanna Epley CTRS LRT 01/18/2020 2:33 PM  Other: Assunta Curtis LCSW  01/18/2020 2:33 PM  Other:  01/18/2020 2:33 PM  Other: 01/18/2020 2:33 PM    Scribe for Treatment Team: Mariann Laster Moton, LCSW 01/18/2020 2:33 PM

## 2020-01-18 NOTE — Progress Notes (Addendum)
Client remained in bed entire shift and meds were brought to bedside. Seemed somewhat irritable. When tried to wake, he yelled he "didn't want to get up" and when I tried to access pain he also yelled "my whole body hurts", I was unable to get a rating. Client requested to have to have bed lowered. UTA SI/HI/AVH.

## 2020-01-18 NOTE — [facility] Counselor (Signed)
CSW attempted to complete PSA with pt, pt refused to participate.   Iris Pert, MSW, LCSW Clinical Social Work 01/18/2020 10:44 AM

## 2020-01-18 NOTE — Plan of Care (Signed)
Pt admits to having depression, anxiety, AVH and SI with no specific plan. Pt wa eucated on care plan and verbalizes understanding. Torrie Mayers RN Problem: Education: Goal: Knowledge of  General Education information/materials will improve Outcome: Not Progressing Goal: Emotional status will improve Outcome: Not Progressing Goal: Mental status will improve Outcome: Not Progressing Goal: Verbalization of understanding the information provided will improve Outcome: Not Progressing   Problem: Safety: Goal: Periods of time without injury will increase Outcome: Progressing   Problem: Physical Regulation: Goal: Complications related to the disease process, condition or treatment will be avoided or minimized Outcome: Not Progressing   Problem: Safety: Goal: Ability to remain free from injury will improve Outcome: Not Progressing   Problem: Education: Goal: Will be free of psychotic symptoms Outcome: Not Progressing Goal: Knowledge of the prescribed therapeutic regimen will improve Outcome: Not Progressing   Problem: Coping: Goal: Coping ability will improve Outcome: Not Progressing Goal: Will verbalize feelings Outcome: Not Progressing   Problem: Safety: Goal: Ability to remain free from injury will improve Outcome: Not Progressing

## 2020-01-19 DIAGNOSIS — F15951 Other stimulant use, unspecified with stimulant-induced psychotic disorder with hallucinations: Secondary | ICD-10-CM | POA: Diagnosis not present

## 2020-01-19 MED ORDER — LITHIUM CARBONATE ER 300 MG PO TBCR
300.0000 mg | EXTENDED_RELEASE_TABLET | Freq: Two times a day (BID) | ORAL | Status: DC
  Administered 2020-01-19 – 2020-01-23 (×9): 300 mg via ORAL
  Filled 2020-01-19 (×9): qty 1

## 2020-01-19 MED ORDER — QUETIAPINE FUMARATE 200 MG PO TABS
300.0000 mg | ORAL_TABLET | Freq: Every day | ORAL | Status: DC
  Administered 2020-01-19: 300 mg via ORAL
  Filled 2020-01-19: qty 1

## 2020-01-19 NOTE — Progress Notes (Signed)
Patient remains isolative in bed. Did not attend group and did not eat evening snack. He remains cooperative when approached and received his prescribed medications without incident. Patient informed to contact staff with any concerns and remains safe at this time with 15 minute safety checks.

## 2020-01-19 NOTE — Plan of Care (Signed)
  Problem: Education: Goal: Knowledge of [place] [facility] Education information/materials will improve Outcome: Not Progressing Goal: Emotional status will improve Outcome: Progressing Goal: Mental status will improve Outcome: Progressing Goal: Verbalization of understanding the information provided will improve Outcome: Progressing   Problem: Safety: Goal: Periods of time without injury will increase Outcome: Progressing   Problem: Physical Regulation: Goal: Complications related to the disease process, condition or treatment will be avoided or minimized Outcome: Progressing   Problem: Safety: Goal: Ability to remain free from injury will improve Outcome: Progressing   Problem: Education: Goal: Will be free of psychotic symptoms Outcome: Progressing   Problem: Coping: Goal: Coping ability will improve Outcome: Progressing Goal: Will verbalize feelings Outcome: Progressing

## 2020-01-19 NOTE — [facility] Counselor (Signed)
CSW attempted to follow up with patient in regards to SPE and aftercare.   Patient did not respond at all when asked by this CSW.  Patient was awake, he just chose not to interact with this CSW.     Penni Homans, MSW, LCSW 01/19/2020 10:57 AM

## 2020-01-19 NOTE — [facility] Suicide Risk Assessment (Signed)
[facility] INPATIENT:  Family/Significant Other Suicide Prevention Education  Suicide Prevention Education:  Patient Refusal for Family/Significant Other Suicide Prevention Education: The patient [name] has refused to provide written consent for family/significant other to be provided Family/Significant Other Suicide Prevention Education during admission and/or prior to discharge.  Physician notified.  SPE completed with pt, as pt refused to consent to family contact. SPI pamphlet provided to pt and pt was encouraged to share information with support network, ask questions, and talk about any concerns relating to SPE. Pt denies access to guns/firearms and verbalized understanding of information provided. Mobile Crisis information also provided to pt.   Harden Mo 01/19/2020, 10:56 AM

## 2020-01-19 NOTE — Progress Notes (Addendum)
Client alert, remained in bed most of shift. He was up for snack and returned to bed. When writer asked if client was in pain, client responded 'My whole damn body hurts"; however client declined tylenol and refused to rate.  When writer attempted to engage further he mumbled "I'm tired of all these fucking questions. Always asking these dam questions" When writer asked client if he wanted her to come back and let him rest, a while, he declined and said he didn't want to talk at all. UTA further. Safety measures in place and 15 minute monitoring ongoing.

## 2020-01-19 NOTE — Progress Notes (Signed)
[facility] MD Progress Note  01/19/2020 1:22 PM [name]  MRN:  [id number] Subjective: Follow-up for this 44 year old man with bipolar disorder and substance abuse.  For the third day in the row he is spending most of the day laying in bed.  I came in to speak with him and found him awake but unwilling to engage in conversation.  Once again he tells me that he feels "terrible" but will not specify what he means by that.  He is eating and drinking a little bit better but still not participating in any groups.  Still endorses having some suicidal ideation.  He is documented as still having statements about paranoia and people hearing his thoughts. 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 Amphetamine abuse ([place]) 09/07/2017  . Anxiety   . Bipolar disorder ([place])   . Depression   . Drug abuse ([place])   . Hepatitis C   . Hepatitis C antibody positive in blood 09/08/2017  . Marijuana abuse 09/07/2017  . Sinus congestion     Past Surgical History:  Procedure Laterality Date  . ANKLE SURGERY    . HERNIA REPAIR    . HERNIA REPAIR    . TENDON REPAIR    . TIBIA IM NAIL INSERTION Left 09/06/2017   Procedure: INTRAMEDULLARY (IM) NAIL TIBIAL LEFT;  Surgeon: Altamese Glasford, MD;  Location: [facility];  Service: Orthopedics;  Laterality: Left;   Family History:  Family History  Problem Relation Age of Onset  . Heart failure Mother    Family Psychiatric  History: See previous Social History:  Social History   Substance and Sexual Activity  Alcohol Use Yes   Comment:  reports weekend use (fri/sat night)     Social History   Substance and Sexual Activity  Drug Use Yes  . Frequency: 3.0 times per week  . Types: Marijuana, Methamphetamines    Social History   Socioeconomic History  . Marital status: Single    Spouse name: Not on file  . Number of children: Not on file  . Years of education: Not on file  . Highest education level: Not on file  Occupational History  . Not on file  Tobacco Use  . Smoking status: Current Every Day Smoker    Packs/day: 1.00    Types: Cigarettes  . Smokeless tobacco: Current User  Substance and Sexual Activity  . Alcohol use: Yes    Comment: reports weekend use (fri/sat night)  . Drug use: Yes    Frequency: 3.0 times per week    Types: Marijuana, Methamphetamines  . Sexual activity: Not on file  Other Topics Concern  . Not on file  Social History Narrative   ** Merged History Encounter **       Social Determinants of Health   Financial Resource Strain:   . Difficulty of Paying Living Expenses:   Food Insecurity:   . Worried About Charity fundraiser in the Last Year:   . Arboriculturist in the Last Year:   Transportation Needs:   . Film/video editor (Medical):   Marland Kitchen Lack of Transportation (Non-Medical):  Physical Activity:   . Days of Exercise per Week:   . Minutes of Exercise per Session:   Stress:   . Feeling of Stress :   Social Connections:   . Frequency of Communication with Friends and Family:   . Frequency of Social Gatherings with Friends and Family:   . Attends Religious Services:   . Active Member of Clubs or Organizations:   . Attends Banker Meetings:   Marland Kitchen Marital Status:    Additional Social History:                         Sleep: Fair  Appetite:  Poor  Current Medications: Current Facility-Administered Medications  Medication Dose Route Frequency Provider Last Rate Last Admin  . acetaminophen (TYLENOL) tablet 650 mg  650 mg Oral Q6H PRN Gillermo Murdoch, NP   650 mg at 01/16/20 2324  . alum & mag hydroxide-simeth (MAALOX/MYLANTA) 200-200-20 MG/5ML suspension 30 mL  30 mL Oral Q4H PRN Gillermo Murdoch, NP      . haloperidol lactate (HALDOL) injection 5 mg  5 mg Intramuscular Once Clessie Karras T, MD      . lithium carbonate (LITHOBID) CR tablet 300 mg  300 mg Oral Q12H Gwendolin Briel T, MD      . LORazepam (ATIVAN) injection 2 mg  2 mg Intramuscular Q6H PRN Raeley Gilmore, Jackquline Denmark, MD   2 mg at 01/16/20 2142  . magnesium hydroxide (MILK OF MAGNESIA) suspension 30 mL  30 mL Oral Daily PRN Gillermo Murdoch, NP      . QUEtiapine (SEROQUEL) tablet 300 mg  300 mg Oral QHS Quetzaly Ebner T, MD        Lab Results: No results found for this or any previous visit (from the past 48 hour(s)).  Blood Alcohol level:  Lab Results  Component Value Date   ETH <10 10/01/2019   ETH <10 08/23/2018    Metabolic Disorder Labs: Lab Results  Component Value Date   HGBA1C 5.6 10/16/2016   MPG 114 10/16/2016   Lab Results  Component Value Date   PROLACTIN 9.8 05/16/2017   PROLACTIN 13.9 10/16/2016   Lab Results  Component Value Date   CHOL 122 05/16/2017   TRIG 101 05/16/2017   HDL 45 05/16/2017   CHOLHDL 2.7 05/16/2017   VLDL 20 05/16/2017   LDLCALC 57 05/16/2017   LDLCALC 90 10/16/2016    Physical Findings: AIMS:  , ,  ,  ,    CIWA:    COWS:     Musculoskeletal: Strength & Muscle Tone: within normal limits Gait & Station: normal Patient leans: N/A  Psychiatric Specialty Exam: Physical Exam  Nursing note and vitals reviewed. Constitutional: He appears well-developed and well-nourished.  HENT:  Head: Normocephalic and atraumatic.  Eyes: Pupils are equal, round, and reactive to light. Conjunctivae are normal.  Cardiovascular: Regular rhythm and normal heart sounds.  Respiratory: Effort normal. No respiratory distress.  GI: Soft.  Musculoskeletal:        General: Normal range of motion.     Cervical back: Normal range of  motion.  Neurological: He is alert.  Skin: Skin is warm and dry.  Psychiatric: His affect is blunt. His speech is delayed. He is slowed and withdrawn. Thought content is delusional. Cognition and memory are impaired. He expresses inappropriate judgment.    Review of Systems  Constitutional: Negative.   HENT: Negative.   Eyes: Negative.   Respiratory: Negative.   Cardiovascular:  Negative.   Gastrointestinal: Negative.   Musculoskeletal: Negative.   Skin: Negative.   Neurological: Negative.   Psychiatric/Behavioral: Positive for dysphoric mood.    Blood pressure 110/72, pulse (!) 103, temperature 97.7 F (36.5 C), temperature source Oral, resp. rate 18, height 6\' 1"  (1.854 m), weight 78.5 kg, SpO2 95 %.Body mass index is 22.82 kg/m.  General Appearance: Disheveled  Eye Contact:  Minimal  Speech:  Slow  Volume:  Decreased  Mood:  Dysphoric  Affect:  Depressed  Thought Process:  Coherent  Orientation:  Full (Time, Place, and Person)  Thought Content:  Delusions and Paranoid Ideation  Suicidal Thoughts:  Yes.  without intent/plan  Homicidal Thoughts:  No  Memory:  Immediate;   Poor  Judgement:  Impaired  Insight:  Shallow  Psychomotor Activity:  Decreased  Concentration:  Concentration: Poor  Recall:  Fair  Fund of Knowledge:  Fair  Language:  Fair  Akathisia:  No  Handed:  Right  AIMS (if indicated):     Assets:  Physical Health Resilience  ADL's:  Impaired  Cognition:  Impaired,  Moderate  Sleep:  Number of Hours: 8.25     Treatment Plan Summary: Daily contact with patient to assess and evaluate symptoms and progress in treatment, Medication management and Plan After initially presenting hyperactive hyperverbal and labile he has now crashed into 3 days of withdrawn flat behavior.  Some of this is certainly crashing from days of amphetamine binging but part of it is probably also a depressive crash out of bipolar disorder.  Reviewed old chart and found that both  quetiapine and lithium were very helpful for him in the past.  He is only been getting 200 mg of quetiapine and that will now be increased to 300 and I will restart lithium being cautious with a dose of 300 twice a day since he really has not been eating and drinking very well.  Lithium level will be checked Monday morning.  Monday, MD 01/19/2020, 1:22 PM
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
## 2020-01-19 NOTE — [facility] Group Notes (Signed)
[facility] Group Notes:  (Nursing/MHT/Case Management/Adjunct)  Date:  01/19/2020  Time:  9:34 AM  Type of Therapy:  Community Meeting  Participation Level:  Did Not Attend   Lynelle Smoke [facility] 01/19/2020, 9:34 AM

## 2020-01-19 NOTE — Plan of Care (Signed)
Patient stayed in bed most of the day stated that he feels terrible.Patient got irritable when asked further questions.Up in the day room for meals.Appetite and energy level good.Compliant with medications.No aggressive behaviors noted.Support and encouragement given.

## 2020-01-19 NOTE — B[facility] Group Notes (Signed)
[facility] Group Notes:  (Nursing/MHT/Case Management/Adjunct)  Date:  01/19/2020  Time:  9:35 PM  Type of Therapy:  Group Therapy  Participation Level:  Did Not Attend  Summary of Progress/Problems:  Mayra Neer 01/19/2020, 9:35 PM

## 2020-01-19 NOTE — Progress Notes (Signed)
Recreation Therapy Notes    Date: 01/19/2020  Time: 9:30 am   Location: Craft room   Behavioral response: N/A   Intervention Topic: Coping-Skills   Discussion/Intervention: Patient did not attend group.   Clinical Observations/Feedback:  Patient did not attend group.   Annemarie Sebree LRT/CTRS        Burman Bruington 01/19/2020 11:43 AM

## 2020-01-20 DIAGNOSIS — F15951 Other stimulant use, unspecified with stimulant-induced psychotic disorder with hallucinations: Secondary | ICD-10-CM | POA: Diagnosis not present

## 2020-01-20 MED ORDER — QUETIAPINE FUMARATE 200 MG PO TABS
400.0000 mg | ORAL_TABLET | Freq: Every day | ORAL | Status: DC
  Administered 2020-01-20 – 2020-01-25 (×6): 400 mg via ORAL
  Filled 2020-01-20 (×6): qty 2

## 2020-01-20 NOTE — [facility] Group Notes (Signed)
LCSW Relapse Prevention and Social Support Group Note   01/20/2020 1300  Type of Group and Topic: Psychoeducational Group:  Relapse prevention and social support.   Participation Level:  Did not attend.  Description of Group  Relapse prevention and developing social support group identifies mental health triggers and early warning signs as a first step towards developing appropriate coping skills.  This can include a relapse of mental health or substance use symptoms.  With the help of examples, patients are encouraged to recognize and intervene when symptoms return before a crisis occurs.  Patients are also engaged to evaluate their current support network and to consider ways to increase and broaden that network.    Therapeutic Goals 1. Patients will identify triggers and early symptoms related to both mental health and substance use relapses. 2. Patients will begin the process of identifying plans/coping skills to manage these symptoms before they escalate to a crisis. 3. Patients will consider individuals in their current support network, whether they are positive or negative supports, and look at options to increase the number of positive supports in their network.    Summary of Patient Progress     Therapeutic Modalities: Cognitive Behavioral Therapy Psychoeducation    [name], MSW, LCSW  

## 2020-01-20 NOTE — Progress Notes (Signed)
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  He also had apparently been taking Xanax and expressed suicidal ideation.  There was also some evidence of auditory hallucinations and paranoia.  Objective: Patient is seen and examined.  Patient is a 44 year old male with the above-stated past psychiatric history seen in follow-up.  From review of the electronic medical record he appears to be unchanged from yesterday.  He remains irritable.  He is basically not interested in talking, and does not really point towards any needs.  He just wants to be "f--king left alone".  No evidence of auditory or visual hallucinations.  He does endorse some suicidal thinking, but it is unclear about his psychotic symptoms.  He has been refusing vital signs throughout the course of the hospitalization.  His sleep is good at 8.25 hours last night.  His current medications include lithium carbonate 300 mg p.o. every 12 hours, lorazepam 2 mg IM every 6 hours as needed agitation and Seroquel 300 mg p.o. nightly.  Review of his admission laboratories revealed elevated liver function enzymes with an AST of 45 and an ALT of 64.  His total protein was elevated 8.2 and total bilirubin was elevated at 1.5.  His white blood cell count was significantly elevated at 20.1.  Differential was not obtained.  Urinalysis was negative, but it does appear to have been diluted.  His specific gravity was 1.002, and otherwise negative.  His drug screen was positive for amphetamines but no other drugs including benzodiazepines.  His EKG showed a sinus rhythm with abnormal Q waves, but it looks like a poor baseline.'s difficult to tell the validity of that.  Additionally a TSH was not  obtained, and with his lithium treatment would be beneficial.  Principal Problem: Amphetamine and psychostimulant-induced psychosis with hallucinations (HCC) Diagnosis: Principal Problem:   Amphetamine and psychostimulant-induced psychosis with hallucinations (HCC) Active Problems:   Drug abuse (HCC)   Amphetamine abuse (HCC)   Bipolar 1 disorder (HCC)  Total Time spent with patient: 15 minutes  Past Psychiatric History: See admission H&P  Past Medical History:  Past Medical History:  Diagnosis Date  . 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 Anxiety   . Bipolar disorder (HCC)   . Depression   . Drug abuse (HCC)   . Hepatitis C   . Hepatitis C antibody positive in blood 09/08/2017  . Marijuana abuse 09/07/2017  . Sinus congestion     Past Surgical History:  Procedure Laterality Date  . ANKLE SURGERY    . HERNIA REPAIR    . HERNIA REPAIR    . TENDON REPAIR    . TIBIA IM NAIL INSERTION Left 09/06/2017   Procedure: INTRAMEDULLARY (IM) NAIL TIBIAL LEFT;  Surgeon: Myrene Galas, MD;  Location: [facility] OR;  Service: Orthopedics;  Laterality: Left;   Family History:  Family History  Problem Relation Age of Onset  . Heart failure Mother    Family Psychiatric  History: See admission H&P Social History:  Social History   Substance and Sexual Activity  Alcohol Use Yes   Comment: reports weekend use (fri/sat night)     Social History   Substance and Sexual Activity  Drug Use Yes  . Frequency: 3.0 times per week  . Types: Marijuana, Methamphetamines  Social History   Socioeconomic History  . Marital status: Single    Spouse name: Not on file  . Number of children: Not on file  . Years of education: Not on file  . Highest education level: Not on file  Occupational History  . Not on file  Tobacco Use  . Smoking status: Current Every Day Smoker    Packs/day: 1.00    Types: Cigarettes  . Smokeless tobacco: Current User  Substance and Sexual Activity  . Alcohol use: Yes     Comment: reports weekend use (fri/sat night)  . Drug use: Yes    Frequency: 3.0 times per week    Types: Marijuana, Methamphetamines  . Sexual activity: Not on file  Other Topics Concern  . Not on file  Social History Narrative   ** Merged History Encounter **       Social Determinants of Health   Financial Resource Strain:   . Difficulty of Paying Living Expenses:   Food Insecurity:   . Worried About Programme researcher, broadcasting/film/video in the Last Year:   . Barista in the Last Year:   Transportation Needs:   . Freight forwarder (Medical):   Marland Kitchen Lack of Transportation (Non-Medical):   Physical Activity:   . Days of Exercise per Week:   . Minutes of Exercise per Session:   Stress:   . Feeling of Stress :   Social Connections:   . Frequency of Communication with Friends and Family:   . Frequency of Social Gatherings with Friends and Family:   . Attends Religious Services:   . Active Member of Clubs or Organizations:   . Attends Banker Meetings:   Marland Kitchen Marital Status:    Additional Social History:                         Sleep: Good  Appetite:  Good  Current Medications: Current Facility-Administered Medications  Medication Dose Route Frequency Provider Last Rate Last Admin  . acetaminophen (TYLENOL) tablet 650 mg  650 mg Oral Q6H PRN Gillermo Murdoch, NP   650 mg at 01/16/20 2324  . alum & mag hydroxide-simeth (MAALOX/MYLANTA) 200-200-20 MG/5ML suspension 30 mL  30 mL Oral Q4H PRN Gillermo Murdoch, NP      . haloperidol lactate (HALDOL) injection 5 mg  5 mg Intramuscular Once Clapacs, John T, MD      . lithium carbonate (LITHOBID) CR tablet 300 mg  300 mg Oral Q12H Clapacs, Jackquline Denmark, MD   300 mg at 01/20/20 0828  . LORazepam (ATIVAN) injection 2 mg  2 mg Intramuscular Q6H PRN Clapacs, Jackquline Denmark, MD   2 mg at 01/16/20 2142  . magnesium hydroxide (MILK OF MAGNESIA) suspension 30 mL  30 mL Oral Daily PRN Gillermo Murdoch, NP      . QUEtiapine  (SEROQUEL) tablet 300 mg  300 mg Oral QHS Clapacs, Jackquline Denmark, MD   300 mg at 01/19/20 2140    Lab Results: No results found for this or any previous visit (from the past 48 hour(s)).  Blood Alcohol level:  Lab Results  Component Value Date   [facility] <10 10/01/2019   ETH <10 08/23/2018    Metabolic Disorder Labs: Lab Results  Component Value Date   HGBA1C 5.6 10/16/2016   MPG 114 10/16/2016   Lab Results  Component Value Date   PROLACTIN 9.8 05/16/2017   PROLACTIN 13.9 10/16/2016   Lab Results  Component Value  Date   CHOL 122 05/16/2017   TRIG 101 05/16/2017   HDL 45 05/16/2017   CHOLHDL 2.7 05/16/2017   VLDL 20 05/16/2017   LDLCALC 57 05/16/2017   LDLCALC 90 10/16/2016    Physical Findings: AIMS:  , ,  ,  ,    CIWA:    COWS:     Musculoskeletal: Strength & Muscle Tone: within normal limits Gait & Station: normal Patient leans: N/A  Psychiatric Specialty Exam: Physical Exam  Nursing note and vitals reviewed. Constitutional: He is oriented to person, place, and time. He appears well-developed and well-nourished.  HENT:  Head: Normocephalic and atraumatic.  Respiratory: Effort normal.  Neurological: He is alert and oriented to person, place, and time.    Review of Systems  Blood pressure 110/72, pulse (!) 103, temperature 97.7 F (36.5 C), temperature source Oral, resp. rate 18, height 6\' 1"  (1.854 m), weight 78.5 kg, SpO2 95 %.Body mass index is 22.82 kg/m.  General Appearance: Disheveled  Eye Contact:  Poor  Speech:  Normal Rate  Volume:  Increased  Mood:  Dysphoric and Irritable  Affect:  Constricted  Thought Process:  Goal Directed and Descriptions of Associations: Circumstantial  Orientation:  Negative  Thought Content:  Paranoid Ideation  Suicidal Thoughts:  Yes.  without intent/plan  Homicidal Thoughts:  No  Memory:  Immediate;   Poor Recent;   Poor Remote;   Poor  Judgement:  Impaired  Insight:  Lacking  Psychomotor Activity:  Normal   Concentration:  Concentration: Fair and Attention Span: Fair  Recall:  [employer] of Knowledge:  Fair  Language:  Good  Akathisia:  Negative  Handed:  Right  AIMS (if indicated):     Assets:  Desire for Improvement Resilience  ADL's:  Intact  Cognition:  WNL  Sleep:  Number of Hours: 8.25     Treatment Plan Summary: Daily contact with patient to assess and evaluate symptoms and progress in treatment, Medication management and Plan : Patient is seen and examined.  Patient is a 44 year old male with the above-stated past psychiatric history who is seen in follow-up.   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  He appears to be essentially unchanged from yesterday.  I will increase his Seroquel to 400 mg p.o. nightly to attempt to get his irritability under better control.  A lithium level is been ordered for 01/22/2020.  He will also need a TSH and chemistries given his elevated creatinine.  He needs to have his liver function enzymes rechecked as well as his white blood cell count which was significantly elevated on admission.  His baseline EKG showed some abnormalities that look to be due to the quality of the baseline.  A repeat EKG has been ordered, but it may be difficult to obtain given his irritability.  I will order the TSH and metabolic panels given his elevation in liver function enzymes as well as creatinine.  I will also order a repeat CBC given his significant leukocytosis on admission.  1.  Continue lithium carbonate 300 mg p.o. every 12 hours for mood stability. 2.  Increase Seroquel to 400 mg p.o. nightly for mood stability and irritability. 3.  Repeat metabolic panel, CBC secondary to admission abnormalities. 4.  Get TSH,  5.  Repeat EKG if possible and get vital signs. 6.  Lithium level on 4/19. 7.  Disposition planning-in progress.  Antonieta Pert, MD 01/20/2020, 12:34 PM

## 2020-01-20 NOTE — Plan of Care (Signed)
Patient oriented to unit. Patient's safety is maintained on unit. Patient denies SI/HI/AVH.  Patient is minimal, and only answer with yes and no. Patient is apprehensive to care. Patient is adherent with scheduled medications.    Problem: Education: Goal: Knowledge of [place] [facility] Education information/materials will improve Outcome: Not Progressing Goal: Emotional status will improve Outcome: Not Progressing Goal: Verbalization of understanding the information provided will improve Outcome: Not Progressing

## 2020-01-21 DIAGNOSIS — F15951 Other stimulant use, unspecified with stimulant-induced psychotic disorder with hallucinations: Secondary | ICD-10-CM | POA: Diagnosis not present

## 2020-01-21 LAB — CBC WITH DIFFERENTIAL/PLATELET
Abs Immature Granulocytes: 0.01 10*3/uL (ref 0.00–0.07)
Basophils Absolute: 0.1 10*3/uL (ref 0.0–0.1)
Basophils Relative: 1 %
Eosinophils Absolute: 0.3 10*3/uL (ref 0.0–0.5)
Eosinophils Relative: 5 %
HCT: 47.4 % (ref 39.0–52.0)
Hemoglobin: 16.4 g/dL (ref 13.0–17.0)
Immature Granulocytes: 0 %
Lymphocytes Relative: 38 %
Lymphs Abs: 2.1 10*3/uL (ref 0.7–4.0)
MCH: 29.8 pg (ref 26.0–34.0)
MCHC: 34.6 g/dL (ref 30.0–36.0)
MCV: 86.2 fL (ref 80.0–100.0)
Monocytes Absolute: 0.5 10*3/uL (ref 0.1–1.0)
Monocytes Relative: 9 %
Neutro Abs: 2.5 10*3/uL (ref 1.7–7.7)
Neutrophils Relative %: 47 %
Platelets: 211 10*3/uL (ref 150–400)
RBC: 5.5 MIL/uL (ref 4.22–5.81)
RDW: 12.2 % (ref 11.5–15.5)
WBC: 5.4 10*3/uL (ref 4.0–10.5)
nRBC: 0 % (ref 0.0–0.2)

## 2020-01-21 LAB — COMPREHENSIVE METABOLIC PANEL
ALT: 71 U/L — ABNORMAL HIGH (ref 0–44)
AST: 45 U/L — ABNORMAL HIGH (ref 15–41)
Albumin: 3.9 g/dL (ref 3.5–5.0)
Alkaline Phosphatase: 41 U/L (ref 38–126)
Anion gap: 5 (ref 5–15)
BUN: 16 mg/dL (ref 6–20)
CO2: 28 mmol/L (ref 22–32)
Calcium: 9.1 mg/dL (ref 8.9–10.3)
Chloride: 104 mmol/L (ref 98–111)
Creatinine, Ser: 0.93 mg/dL (ref 0.61–1.24)
GFR calc Af Amer: >60 mL/min (ref >60)
GFR calc non Af Amer: >60 mL/min (ref >60)
Glucose, Bld: 99 mg/dL (ref 70–99)
Potassium: 3.9 mmol/L (ref 3.5–5.1)
Sodium: 137 mmol/L (ref 135–145)
Total Bilirubin: 0.7 mg/dL (ref 0.3–1.2)
Total Protein: 7 g/dL (ref 6.5–8.1)

## 2020-01-21 LAB — TSH: TSH: 0.533 u[IU]/mL (ref 0.350–4.500)

## 2020-01-21 NOTE — Plan of Care (Signed)
  Problem: Education: Goal: Knowledge of [place] [facility] Education information/materials will improve Outcome: Progressing Goal: Emotional status will improve Outcome: Progressing Goal: Mental status will improve Outcome: Progressing Goal: Verbalization of understanding the information provided will improve Outcome: Progressing   Problem: Safety: Goal: Periods of time without injury will increase Outcome: Progressing   Problem: Physical Regulation: Goal: Complications related to the disease process, condition or treatment will be avoided or minimized Outcome: Progressing

## 2020-01-21 NOTE — Progress Notes (Signed)
Patient isolative to self and room. Denies SI, HI, AVH. Forwards minimal. Does not like to answer questions. Isolative to self and room. Comes out for Meals and meds, does not attend any group.  Encouragement and support provided. Safety checks maintained. Meds given as prescribed. Pt remains safe on unit with q 15 min checks.

## 2020-01-21 NOTE — Progress Notes (Signed)
Care of patient taken over at 2300 and patient seemed to rest well through out the night, with no issues to report on shift at this time.  

## 2020-01-21 NOTE — Progress Notes (Signed)
[facility] MD Progress Note  01/21/2020 10:31 AM [name]  MRN:  [id number] Subjective:  Patient is a 44 year old male with a past psychiatric history significant for methamphetamine dependence as well as possible bipolar disorder who was admitted on 01/16/2020 after binging on methamphetamines for several days.  He also had apparently been taking Xanax and expressed suicidal ideation.  There was also some evidence of auditory hallucinations and paranoia.  Objective: Patient is seen and examined.  Patient is a 44 year old male with the above-stated past psychiatric history who is seen in follow-up.  He remains essentially unchanged.  He continues to be irritable and profane.  He does not make eye contact at all.  He does get out of bed and go to meals.  He has not participated in any groups.  He has continued to refuse vital signs, but nursing notes reflect that he slept 8.5 hours last night.  Nursing reflects that "patient keeps saying "I do not need an GD medicine".  His Seroquel was increased to 400 mg p.o. nightly last night, and he continues on lithium carbonate 300 mg p.o. every 12 hours.  His laboratories from 4/18 showed normal electrolytes with a normal creatinine at 0.93.  His AST which was 45 on admission remains at 45.  His ALT which was 64 on admission remains elevated at 71.  His white blood cell count normalized to 5.4.  The rest of his CBC is normal.  His TSH was normal at 0.533.  No other new laboratories.  Principal Problem: Amphetamine and psychostimulant-induced psychosis with hallucinations (HCC) Diagnosis: Principal Problem:   Amphetamine and psychostimulant-induced psychosis with hallucinations (HCC) Active Problems:   Drug abuse (HCC)   Amphetamine abuse (HCC)   Bipolar 1 disorder (HCC)  Total Time spent with patient: 15 minutes  Past Psychiatric History: See admission H&P  Past Medical History:  Past Medical History:  Diagnosis Date  . 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   . Anxiety   . Bipolar disorder (HCC)   . Depression   . Drug abuse (HCC)   . Hepatitis C   . Hepatitis C antibody positive in blood 09/08/2017  . Marijuana abuse 09/07/2017  . Sinus congestion     Past Surgical History:  Procedure Laterality Date  . ANKLE SURGERY    . HERNIA REPAIR    . HERNIA REPAIR    . TENDON REPAIR    . TIBIA IM NAIL INSERTION Left 09/06/2017   Procedure: INTRAMEDULLARY (IM) NAIL TIBIAL LEFT;  Surgeon: Myrene Galas, MD;  Location: [facility] OR;  Service: Orthopedics;  Laterality: Left;   Family History:  Family History  Problem Relation Age of Onset  . Heart failure Mother    Family Psychiatric  History: See admission H&P Social History:  Social History   Substance and Sexual Activity  Alcohol Use Yes   Comment: reports weekend use (fri/sat night)     Social History   Substance and Sexual Activity  Drug Use Yes  . Frequency: 3.0 times per week  . Types: Marijuana, Methamphetamines    Social History   Socioeconomic History  . Marital status: Single    Spouse name: Not on file  . Number of children: Not on file  . Years of education: Not on file  . Highest education level: Not on file  Occupational History  . Not on file  Tobacco Use  . Smoking status: Current Every Day Smoker    Packs/day: 1.00    Types: Cigarettes  . Smokeless tobacco:  Current User  Substance and Sexual Activity  . Alcohol use: Yes    Comment: reports weekend use (fri/sat night)  . Drug use: Yes    Frequency: 3.0 times per week    Types: Marijuana, Methamphetamines  . Sexual activity: Not on file  Other Topics Concern  . Not on file  Social History Narrative   ** Merged History Encounter **       Social Determinants of Health   Financial Resource Strain:   . Difficulty of Paying Living Expenses:   Food Insecurity:   . Worried About Programme researcher, broadcasting/film/video in the Last Year:   . Barista in the Last Year:   Transportation Needs:   . Freight forwarder  (Medical):   Marland Kitchen Lack of Transportation (Non-Medical):   Physical Activity:   . Days of Exercise per Week:   . Minutes of Exercise per Session:   Stress:   . Feeling of Stress :   Social Connections:   . Frequency of Communication with Friends and Family:   . Frequency of Social Gatherings with Friends and Family:   . Attends Religious Services:   . Active Member of Clubs or Organizations:   . Attends Banker Meetings:   Marland Kitchen Marital Status:    Additional Social History:                         Sleep: Good  Appetite:  Good  Current Medications: Current Facility-Administered Medications  Medication Dose Route Frequency Provider Last Rate Last Admin  . acetaminophen (TYLENOL) tablet 650 mg  650 mg Oral Q6H PRN Gillermo Murdoch, NP   650 mg at 01/16/20 2324  . alum & mag hydroxide-simeth (MAALOX/MYLANTA) 200-200-20 MG/5ML suspension 30 mL  30 mL Oral Q4H PRN Gillermo Murdoch, NP      . haloperidol lactate (HALDOL) injection 5 mg  5 mg Intramuscular Once Clapacs, John T, MD      . lithium carbonate (LITHOBID) CR tablet 300 mg  300 mg Oral Q12H Clapacs, Jackquline Denmark, MD   300 mg at 01/21/20 0735  . LORazepam (ATIVAN) injection 2 mg  2 mg Intramuscular Q6H PRN Clapacs, Jackquline Denmark, MD   2 mg at 01/16/20 2142  . magnesium hydroxide (MILK OF MAGNESIA) suspension 30 mL  30 mL Oral Daily PRN Gillermo Murdoch, NP      . QUEtiapine (SEROQUEL) tablet 400 mg  400 mg Oral QHS Antonieta Pert, MD   400 mg at 01/20/20 2117    Lab Results:  Results for orders placed or performed during the hospital encounter of 01/16/20 (from the past 48 hour(s))  Comprehensive metabolic panel     Status: Abnormal   Collection Time: 01/21/20  6:51 AM  Result Value Ref Range   Sodium 137 135 - 145 mmol/L   Potassium 3.9 3.5 - 5.1 mmol/L   Chloride 104 98 - 111 mmol/L   CO2 28 22 - 32 mmol/L   Glucose, Bld 99 70 - 99 mg/dL    Comment: Glucose reference range applies only to samples taken  after fasting for at least 8 hours.   BUN 16 6 - 20 mg/dL   Creatinine, Ser 3.24 0.61 - 1.24 mg/dL   Calcium 9.1 8.9 - 40.1 mg/dL   Total Protein 7.0 6.5 - 8.1 g/dL   Albumin 3.9 3.5 - 5.0 g/dL   AST 45 (H) 15 - 41 U/L   ALT 71 (H) 0 -  44 U/L   Alkaline Phosphatase 41 38 - 126 U/L   Total Bilirubin 0.7 0.3 - 1.2 mg/dL   GFR calc non Af Amer >60 >60 mL/min   GFR calc Af Amer >60 >60 mL/min   Anion gap 5 5 - 15    Comment: Performed at [facility], [address] [place], [place] [address]  CBC with Differential/Platelet     Status: None   Collection Time: 01/21/20  6:51 AM  Result Value Ref Range   WBC 5.4 4.0 - 10.5 K/uL   RBC 5.50 4.22 - 5.81 MIL/uL   Hemoglobin 16.4 13.0 - 17.0 g/dL   HCT 47.4 39.0 - 52.0 %   MCV 86.2 80.0 - 100.0 fL   MCH 29.8 26.0 - 34.0 pg   MCHC 34.6 30.0 - 36.0 g/dL   RDW 12.2 11.5 - 15.5 %   Platelets 211 150 - 400 K/uL   nRBC 0.0 0.0 - 0.2 %   Neutrophils Relative % 47 %   Neutro Abs 2.5 1.7 - 7.7 K/uL   Lymphocytes Relative 38 %   Lymphs Abs 2.1 0.7 - 4.0 K/uL   Monocytes Relative 9 %   Monocytes Absolute 0.5 0.1 - 1.0 K/uL   Eosinophils Relative 5 %   Eosinophils Absolute 0.3 0.0 - 0.5 K/uL   Basophils Relative 1 %   Basophils Absolute 0.1 0.0 - 0.1 K/uL   Immature Granulocytes 0 %   Abs Immature Granulocytes 0.01 0.00 - 0.07 K/uL    Comment: Performed at [facility], [address] [place], [place] [address]  TSH     Status: None   Collection Time: 01/21/20  6:51 AM  Result Value Ref Range   TSH 0.533 0.350 - 4.500 uIU/mL    Comment: Performed by a 3rd Generation assay with a functional sensitivity of <=0.01 uIU/mL. Performed at [facility], [address] [place], [place] [address]     Blood Alcohol level:  Lab Results  Component Value Date   [facility] <10 10/01/2019   ETH <10 62/83/6629    Metabolic Disorder Labs: Lab Results  Component Value Date   HGBA1C 5.6 10/16/2016   MPG 114 10/16/2016    Lab Results  Component Value Date   PROLACTIN 9.8 05/16/2017   PROLACTIN 13.9 10/16/2016   Lab Results  Component Value Date   CHOL 122 05/16/2017   TRIG 101 05/16/2017   HDL 45 05/16/2017   CHOLHDL 2.7 05/16/2017   VLDL 20 05/16/2017   LDLCALC 57 05/16/2017   LDLCALC 90 10/16/2016    Physical Findings: AIMS:  , ,  ,  ,    CIWA:    COWS:     Musculoskeletal: Strength & Muscle Tone: within normal limits Gait & Station: normal Patient leans: N/A  Psychiatric Specialty Exam: Physical Exam  Nursing note and vitals reviewed. Constitutional: He is oriented to person, place, and time. He appears well-developed and well-nourished.  HENT:  Head: Normocephalic and atraumatic.  Respiratory: Effort normal.  Neurological: He is alert and oriented to person, place, and time.    Review of Systems  Blood pressure 110/72, pulse (!) 103, temperature 97.7 F (36.5 C), temperature source Oral, resp. rate 18, height 6\' 1"  (1.854 m), weight 78.5 kg, SpO2 95 %.Body mass index is 22.82 kg/m.  General Appearance: Disheveled  Eye Contact:  Minimal  Speech:  Normal Rate  Volume:  Normal  Mood:  Dysphoric and Irritable  Affect:  Congruent  Thought Process:  Goal Directed and Descriptions of Associations: Circumstantial  Orientation:  Full (Time, Place, and Person)  Thought Content:  Rumination  Suicidal Thoughts:  No  Homicidal Thoughts:  No  Memory:  Immediate;   Poor Recent;   Poor Remote;   Poor  Judgement:  Impaired  Insight:  Lacking  Psychomotor Activity:  Normal  Concentration:  Concentration: Fair and Attention Span: Fair  Recall:  Poor  Fund of Knowledge:  Poor  Language:  Good  Akathisia:  Negative  Handed:  Right  AIMS (if indicated):     Assets:  Desire for Improvement Resilience  ADL's:  Intact  Cognition:  WNL  Sleep:  Number of Hours: 8.5     Treatment Plan Summary: Daily contact with patient to assess and evaluate symptoms and progress in treatment,  Medication management and Plan : Patient is seen and examined.  Patient is a 44 year old male with the above-stated past psychiatric history who is seen in follow-up.   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  He is essentially unchanged from yesterday.  His repeat laboratories showed resolution of his leukocytosis, normalization of his creatinine, normal TSH.  He is not showing any signs or symptoms of major withdrawal at this point.  He has a lithium level written for tomorrow morning.  He has continued to refuse vital signs and a repeat EKG.  No changes today after his Seroquel was increased yesterday.  1.  Continue lithium carbonate 300 mg p.o. every 12 hours for mood stability. 2.  Continue Seroquel 400 mg p.o. nightly for mood stability and irritability. 3.  Resolution of leukocytosis and increased creatinine. 4.  Repeat EKG if possible and get vital signs. 5.  Lithium level on 4/19. 6.  Disposition planning-in progress.  Antonieta Pert, MD 01/21/2020, 10:31 AM

## 2020-01-21 NOTE — [facility] Group Notes (Signed)
LCSW Wellness Group Note   01/21/2020 1300  Type of Group and Topic: Psychoeducational Group:  Wellness  Participation Level:  Did not attend  Description of Group  Wellness group introduces the topic and its focus on developing healthy habits across the spectrum and its relationship to a decrease in hospital admissions.  Six areas of wellness are discussed: physical, social spiritual, intellectual, occupational, and emotional.  Patients are asked to consider their current wellness habits and to identify areas of wellness where they are interested and able to focus on improvements.    Therapeutic Goals 1. Patients will understand components of wellness and how they can positively impact overall health.  2. Patients will identify areas of wellness where they have developed good habits. 3. Patients will identify areas of wellness where they would like to make improvements.    Summary of Patient Progress     Therapeutic Modalities: Cognitive Behavioral Therapy Psychoeducation    Alanie Syler Jon, LCSW   

## 2020-01-22 DIAGNOSIS — F15951 Other stimulant use, unspecified with stimulant-induced psychotic disorder with hallucinations: Secondary | ICD-10-CM | POA: Diagnosis not present

## 2020-01-22 MED ORDER — NICOTINE 21 MG/24HR TD PT24
21.0000 mg | MEDICATED_PATCH | Freq: Every day | TRANSDERMAL | Status: DC
  Administered 2020-01-22 – 2020-01-26 (×5): 21 mg via TRANSDERMAL
  Filled 2020-01-22 (×5): qty 1

## 2020-01-22 NOTE — Plan of Care (Signed)
Patient more active on the unit and more pleasant this evening  Problem: Education: Goal: Emotional status will improve Outcome: Progressing Goal: Mental status will improve Outcome: Progressing

## 2020-01-22 NOTE — Progress Notes (Signed)
Patient was in his room upon arrival to the unit. Patient was more pleasant during assessment this evening denying SI/HI/AVH. Patient compliant with medication administration per MD orders. Patient more active on the unit this evening watching TV in the day room with other patients. Patient given education, support and encouragement to be active in his treatment plan. Patient being monitored Q 15 minutes for safety per unit protocol. Patient remains safe on the unit.

## 2020-01-22 NOTE — Progress Notes (Signed)
Patient isolative to self and room. Minimal interaction with staff no interaction with peers. Writer attempted to ask patient questions, patient only gave short answers. Pt reports does not want to go to rehab. Has no idea what he wants to do. Patient only asked for some ativan that was given to him early part of admission. Pt denies SI, HI, AVH. No group not visible in milieu. Encouragement and support offered. Pt avoidant. Pt remains safe on unit with q 15 min checks.

## 2020-01-22 NOTE — [facility] Group Notes (Signed)
[facility] Group Notes:  (Nursing/MHT/Case Management/Adjunct)  Date:  01/22/2020  Time:  10:28 AM  Type of Therapy:  Community Meeting  Participation Level:  Did Not Attend   Summary of Progress/Problems:  [name] 01/22/2020, 10:28 AM

## 2020-01-22 NOTE — B[facility] Counselor (Addendum)
CSW followed up with patient in regards to SPE.  Pt again declined.   CSW again attempted to follow up on aftercare. Patient stated that he did not believe that CSW team was doing anything to help him.  Pt was abrasive in tone. Pt declined to sign consent.  CSW asked about housing and pt reports that he is homeless.  Pt declined information on [employer].  Patient was unable to report his plans at discharge stating that he would "figure it out".  CSW will place in pt's chart local MH resources and information on shelters.    Penni Homans, MSW, LCSW 01/22/2020 10:10 AM

## 2020-01-22 NOTE — [facility] Group Notes (Signed)
[facility] Group Notes:  (Nursing/MHT/Case Management/Adjunct)  Date:  01/22/2020  Time:  11:51 AM  Type of Therapy:  Psychoeducational Skills  Participation Level:  Did Not Attend   Kerrie Pleasure 01/22/2020, 11:51 AM

## 2020-01-22 NOTE — [facility] Group Notes (Signed)
Emotional Regulation 01/22/2020 1PM  Type of Therapy/Topic:  Group Therapy:  Emotion Regulation  Participation Level:  Did Not Attend   Description of Group:   The purpose of this group is to assist patients in learning to regulate negative emotions and experience positive emotions. Patients will be guided to discuss ways in which they have been vulnerable to their negative emotions. These vulnerabilities will be juxtaposed with experiences of positive emotions or situations, and patients will be challenged to use positive emotions to combat negative ones. Special emphasis will be placed on coping with negative emotions in conflict situations, and patients will process healthy conflict resolution skills.  Therapeutic Goals: 1. Patient will identify two positive emotions or experiences to reflect on in order to balance out negative emotions 2. Patient will label two or more emotions that they find the most difficult to experience 3. Patient will demonstrate positive conflict resolution skills through discussion and/or role plays  Summary of Patient Progress:       Therapeutic Modalities:   Cognitive Behavioral Therapy Feelings Identification Dialectical Behavioral Therapy   Suzan Slick, LCSW 01/22/2020 2:36 PM

## 2020-01-22 NOTE — Progress Notes (Signed)
[facility] MD Progress Note  01/22/2020 4:09 PM [name]  MRN:  [id number] Subjective: Follow-up for patient with bipolar disorder and drug abuse.  Patient continues to have a chief complaint of "I feel terrible".  I did see him get up out of bed and eat today and he looks a little better groomed but when I came into see him he still made no eye contact and talked very little.  He answered in the affirmative that he was still feeling like people could hear his thoughts and that he could hear other people's thoughts.  He does not participate much in group but stays very withdrawn.  Continues to have some passive suicidal thoughts but is not acting out on the unit.  For reasons unclear the lithium level that was ordered was not drawn.  Another one will have to be ordered for tomorrow. Principal Problem: Amphetamine and psychostimulant-induced psychosis with hallucinations (HCC) Diagnosis: Principal Problem:   Amphetamine and psychostimulant-induced psychosis with hallucinations (HCC) Active Problems:   Drug abuse (HCC)   Amphetamine abuse (HCC)   Bipolar 1 disorder (HCC)  Total Time spent with patient: 30 minutes  Past Psychiatric History: Past history of bipolar disorder and substance abuse  Past Medical History:  Past Medical History:  Diagnosis Date  . 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 Anxiety   . Bipolar disorder (HCC)   . Depression   . Drug abuse (HCC)   . Hepatitis C   . Hepatitis C antibody positive in blood 09/08/2017  . Marijuana abuse 09/07/2017  . Sinus congestion     Past Surgical History:  Procedure Laterality Date  . ANKLE SURGERY    . HERNIA REPAIR    . HERNIA REPAIR    . TENDON REPAIR    . TIBIA IM NAIL INSERTION Left 09/06/2017   Procedure: INTRAMEDULLARY (IM) NAIL TIBIAL LEFT;  Surgeon: Myrene Galas, MD;  Location: [facility] OR;  Service: Orthopedics;  Laterality: Left;   Family History:  Family History  Problem Relation Age of Onset  . Heart failure Mother     Family Psychiatric  History: See previous Social History:  Social History   Substance and Sexual Activity  Alcohol Use Yes   Comment: reports weekend use (fri/sat night)     Social History   Substance and Sexual Activity  Drug Use Yes  . Frequency: 3.0 times per week  . Types: Marijuana, Methamphetamines    Social History   Socioeconomic History  . Marital status: Single    Spouse name: Not on file  . Number of children: Not on file  . Years of education: Not on file  . Highest education level: Not on file  Occupational History  . Not on file  Tobacco Use  . Smoking status: Current Every Day Smoker    Packs/day: 1.00    Types: Cigarettes  . Smokeless tobacco: Current User  Substance and Sexual Activity  . Alcohol use: Yes    Comment: reports weekend use (fri/sat night)  . Drug use: Yes    Frequency: 3.0 times per week    Types: Marijuana, Methamphetamines  . Sexual activity: Not on file  Other Topics Concern  . Not on file  Social History Narrative   ** Merged History Encounter **       Social Determinants of Health   Financial Resource Strain:   . Difficulty of Paying Living Expenses:   Food Insecurity:   . Worried About Programme researcher, broadcasting/film/video in the Last Year:   .  Ran Out of Food in the Last Year:   Transportation Needs:   . Freight forwarder (Medical):   Marland Kitchen Lack of Transportation (Non-Medical):   Physical Activity:   . Days of Exercise per Week:   . Minutes of Exercise per Session:   Stress:   . Feeling of Stress :   Social Connections:   . Frequency of Communication with Friends and Family:   . Frequency of Social Gatherings with Friends and Family:   . Attends Religious Services:   . Active Member of Clubs or Organizations:   . Attends Banker Meetings:   Marland Kitchen Marital Status:    Additional Social History:                         Sleep: Fair  Appetite:  Fair  Current Medications: Current Facility-Administered  Medications  Medication Dose Route Frequency Provider Last Rate Last Admin  . acetaminophen (TYLENOL) tablet 650 mg  650 mg Oral Q6H PRN Gillermo Murdoch, NP   650 mg at 01/16/20 2324  . alum & mag hydroxide-simeth (MAALOX/MYLANTA) 200-200-20 MG/5ML suspension 30 mL  30 mL Oral Q4H PRN Gillermo Murdoch, NP      . haloperidol lactate (HALDOL) injection 5 mg  5 mg Intramuscular Once Delson Dulworth T, MD      . lithium carbonate (LITHOBID) CR tablet 300 mg  300 mg Oral Q12H Authur Cubit, Jackquline Denmark, MD   300 mg at 01/22/20 0723  . LORazepam (ATIVAN) injection 2 mg  2 mg Intramuscular Q6H PRN Jonne Rote, Jackquline Denmark, MD   2 mg at 01/21/20 2000  . magnesium hydroxide (MILK OF MAGNESIA) suspension 30 mL  30 mL Oral Daily PRN Gillermo Murdoch, NP      . nicotine (NICODERM CQ - dosed in mg/24 hours) patch 21 mg  21 mg Transdermal Daily Quantina Dershem, Jackquline Denmark, MD   21 mg at 01/22/20 0724  . QUEtiapine (SEROQUEL) tablet 400 mg  400 mg Oral QHS Antonieta Pert, MD   400 mg at 01/21/20 2000    Lab Results:  Results for orders placed or performed during the hospital encounter of 01/16/20 (from the past 48 hour(s))  Comprehensive metabolic panel     Status: Abnormal   Collection Time: 01/21/20  6:51 AM  Result Value Ref Range   Sodium 137 135 - 145 mmol/L   Potassium 3.9 3.5 - 5.1 mmol/L   Chloride 104 98 - 111 mmol/L   CO2 28 22 - 32 mmol/L   Glucose, Bld 99 70 - 99 mg/dL    Comment: Glucose reference range applies only to samples taken after fasting for at least 8 hours.   BUN 16 6 - 20 mg/dL   Creatinine, Ser 8.36 0.61 - 1.24 mg/dL   Calcium 9.1 8.9 - 62.9 mg/dL   Total Protein 7.0 6.5 - 8.1 g/dL   Albumin 3.9 3.5 - 5.0 g/dL   AST 45 (H) 15 - 41 U/L   ALT 71 (H) 0 - 44 U/L   Alkaline Phosphatase 41 38 - 126 U/L   Total Bilirubin 0.7 0.3 - 1.2 mg/dL   GFR calc non Af Amer >60 >60 mL/min   GFR calc Af Amer >60 >60 mL/min   Anion gap 5 5 - 15    Comment: Performed at [facility], [address] [place], [place] [address]  CBC with Differential/Platelet     Status: None   Collection Time: 01/21/20  6:51  AM  Result Value Ref Range   WBC 5.4 4.0 - 10.5 K/uL   RBC 5.50 4.22 - 5.81 MIL/uL   Hemoglobin 16.4 13.0 - 17.0 g/dL   HCT 56.3 87.5 - 64.3 %   MCV 86.2 80.0 - 100.0 fL   MCH 29.8 26.0 - 34.0 pg   MCHC 34.6 30.0 - 36.0 g/dL   RDW 32.9 51.8 - 84.1 %   Platelets 211 150 - 400 K/uL   nRBC 0.0 0.0 - 0.2 %   Neutrophils Relative % 47 %   Neutro Abs 2.5 1.7 - 7.7 K/uL   Lymphocytes Relative 38 %   Lymphs Abs 2.1 0.7 - 4.0 K/uL   Monocytes Relative 9 %   Monocytes Absolute 0.5 0.1 - 1.0 K/uL   Eosinophils Relative 5 %   Eosinophils Absolute 0.3 0.0 - 0.5 K/uL   Basophils Relative 1 %   Basophils Absolute 0.1 0.0 - 0.1 K/uL   Immature Granulocytes 0 %   Abs Immature Granulocytes 0.01 0.00 - 0.07 K/uL    Comment: Performed at [facility], [address] [place], [place] [address]  TSH     Status: None   Collection Time: 01/21/20  6:51 AM  Result Value Ref Range   TSH 0.533 0.350 - 4.500 uIU/mL    Comment: Performed by a 3rd Generation assay with a functional sensitivity of <=0.01 uIU/mL. Performed at [facility], [address] [place], [place] [address]     Blood Alcohol level:  Lab Results  Component Value Date   [facility] <10 10/01/2019   ETH <10 08/23/2018    Metabolic Disorder Labs: Lab Results  Component Value Date   HGBA1C 5.6 10/16/2016   MPG 114 10/16/2016   Lab Results  Component Value Date   PROLACTIN 9.8 05/16/2017   PROLACTIN 13.9 10/16/2016   Lab Results  Component Value Date   CHOL 122 05/16/2017   TRIG 101 05/16/2017   HDL 45 05/16/2017   CHOLHDL 2.7 05/16/2017   VLDL 20 05/16/2017   LDLCALC 57 05/16/2017   LDLCALC 90 10/16/2016    Physical Findings: AIMS:  , ,  ,  ,    CIWA:    COWS:     Musculoskeletal: Strength & Muscle Tone: decreased Gait & Station: normal Patient leans: N/A  Psychiatric Specialty  Exam: Physical Exam  Nursing note and vitals reviewed. Constitutional: He appears well-developed and well-nourished.  HENT:  Head: Normocephalic and atraumatic.  Eyes: Pupils are equal, round, and reactive to light. Conjunctivae are normal.  Cardiovascular: Regular rhythm and normal heart sounds.  Respiratory: Effort normal. No respiratory distress.  GI: Soft.  Musculoskeletal:        General: Normal range of motion.     Cervical back: Normal range of motion.  Neurological: He is alert.  Skin: Skin is warm and dry.  Psychiatric: His affect is blunt. His speech is delayed. He is slowed and withdrawn. Thought content is paranoid and delusional. Cognition and memory are impaired. He expresses impulsivity and inappropriate judgment. He exhibits a depressed mood.    Review of Systems  Constitutional: Negative.   HENT: Negative.   Eyes: Negative.   Respiratory: Negative.   Cardiovascular: Negative.   Gastrointestinal: Negative.   Musculoskeletal: Negative.   Skin: Negative.   Neurological: Negative.   Psychiatric/Behavioral: Positive for decreased concentration, dysphoric mood and hallucinations.    Blood pressure 110/72, pulse (!) 103, temperature 97.7 F (36.5 C), temperature source Oral, resp. rate 18, height  6\' 1"  (1.854 m), weight 78.5 kg, SpO2 95 %.Body mass index is 22.82 kg/m.  General Appearance: Disheveled  Eye Contact:  Minimal  Speech:  Slow  Volume:  Decreased  Mood:  Depressed  Affect:  Blunt and Depressed  Thought Process:  Coherent  Orientation:  Full (Time, Place, and Person)  Thought Content:  Illogical, Delusions, Hallucinations: Auditory and Tangential  Suicidal Thoughts:  No  Homicidal Thoughts:  No  Memory:  Immediate;   Fair Recent;   Poor Remote;   Poor  Judgement:  Impaired  Insight:  Shallow  Psychomotor Activity:  Decreased  Concentration:  Concentration: Poor  Recall:  Poor  Fund of Knowledge:  Fair  Language:  Fair  Akathisia:  No  Handed:   Right  AIMS (if indicated):     Assets:  Desire for Improvement Housing Physical Health Resilience  ADL's:  Impaired  Cognition:  Impaired,  Mild  Sleep:  Number of Hours: 8.25     Treatment Plan Summary: Daily contact with patient to assess and evaluate symptoms and progress in treatment, Medication management and Plan Patient is getting 400 mg of Seroquel and also lithium although dose may not yet be adequate.  Recheck or at least check for the first time level tomorrow morning at which point we can try going up on it.  Encourage patient to please get out of bed attend groups be more interactive.  Alethia Berthold, MD 01/22/2020, 4:09 PM

## 2020-01-22 NOTE — Progress Notes (Signed)
Recreation Therapy Notes  Date: 01/22/2020  Time: 9:30 am   Location: Craft room   Behavioral response: N/A   Intervention Topic: Self-esteem   Discussion/Intervention: Patient did not attend group.   Clinical Observations/Feedback:  Patient did not attend group.   Daxon Kyne LRT/CTRS        Ashlea Dusing 01/22/2020 12:03 PM 

## 2020-01-23 DIAGNOSIS — F15951 Other stimulant use, unspecified with stimulant-induced psychotic disorder with hallucinations: Secondary | ICD-10-CM | POA: Diagnosis not present

## 2020-01-23 LAB — LITHIUM LEVEL: Lithium Lvl: 0.33 mmol/L — ABNORMAL LOW (ref 0.60–1.20)

## 2020-01-23 MED ORDER — LITHIUM CARBONATE ER 300 MG PO TBCR
600.0000 mg | EXTENDED_RELEASE_TABLET | Freq: Two times a day (BID) | ORAL | Status: DC
  Administered 2020-01-23 – 2020-01-26 (×6): 600 mg via ORAL
  Filled 2020-01-23 (×7): qty 2

## 2020-01-23 MED ORDER — HYDROXYZINE HCL 50 MG PO TABS
50.0000 mg | ORAL_TABLET | Freq: Four times a day (QID) | ORAL | Status: DC | PRN
  Administered 2020-01-23 – 2020-01-25 (×3): 50 mg via ORAL
  Filled 2020-01-23 (×3): qty 1

## 2020-01-23 NOTE — [facility] Group Notes (Signed)
[facility] Group Notes:  (Nursing/MHT/Case Management/Adjunct)  Date:  01/23/2020  Time:  9:52 PM  Type of Therapy:  Group Therapy  Participation Level:  Active  Participation Quality:  Appropriate  Affect:  Appropriate  Cognitive:  Appropriate  Insight:  Good  Engagement in Group:  Engaged and Supportive  Modes of Intervention:  Discussion and Support  Summary of Progress/Problems: PT wants to be more positive instead of having negative thought. Don't want to be in a depressed state of mind. His goal when he is discharged is to find a job and become productive  [name] 01/23/2020, 9:52 PM

## 2020-01-23 NOTE — Plan of Care (Signed)
  Problem: Safety: Goal: Ability to remain free from injury will improve Outcome: Progressing   Problem: Coping: Goal: Coping ability will improve Outcome: Progressing Goal: Will verbalize feelings Outcome: Progressing   Problem: Education: Goal: Will be free of psychotic symptoms Outcome: Progressing Goal: Knowledge of the prescribed therapeutic regimen will improve Outcome: Progressing   Problem: Physical Regulation: Goal: Complications related to the disease process, condition or treatment will be avoided or minimized Outcome: Progressing   Problem: Education: Goal: Emotional status will improve Outcome: Progressing Goal: Mental status will improve Outcome: Progressing Goal: Verbalization of understanding the information provided will improve Outcome: Progressing

## 2020-01-23 NOTE — Tx Team (Signed)
Interdisciplinary Treatment and Diagnostic Plan Update  01/23/2020 Time of Session: 830am [name] MRN: [id number]  Principal Diagnosis: Amphetamine and psychostimulant-induced psychosis with hallucinations (HCC)  Secondary Diagnoses: Principal Problem:   Amphetamine and psychostimulant-induced psychosis with hallucinations (HCC) Active Problems:   Drug abuse (HCC)   Amphetamine abuse (HCC)   Bipolar 1 disorder (HCC)   Current Medications:  Current Facility-Administered Medications  Medication Dose Route Frequency Provider Last Rate Last Admin  . acetaminophen (TYLENOL) tablet 650 mg  650 mg Oral Q6H PRN Gillermo Murdoch, NP   650 mg at 01/16/20 2324  . alum & mag hydroxide-simeth (MAALOX/MYLANTA) 200-200-20 MG/5ML suspension 30 mL  30 mL Oral Q4H PRN Gillermo Murdoch, NP      . haloperidol lactate (HALDOL) injection 5 mg  5 mg Intramuscular Once Clapacs, John T, MD      . lithium carbonate (LITHOBID) CR tablet 300 mg  300 mg Oral Q12H Clapacs, Jackquline Denmark, MD   300 mg at 01/23/20 0813  . LORazepam (ATIVAN) injection 2 mg  2 mg Intramuscular Q6H PRN Clapacs, Jackquline Denmark, MD   2 mg at 01/22/20 2103  . magnesium hydroxide (MILK OF MAGNESIA) suspension 30 mL  30 mL Oral Daily PRN Gillermo Murdoch, NP      . nicotine (NICODERM CQ - dosed in mg/24 hours) patch 21 mg  21 mg Transdermal Daily Clapacs, Jackquline Denmark, MD   21 mg at 01/23/20 0814  . QUEtiapine (SEROQUEL) tablet 400 mg  400 mg Oral QHS Antonieta Pert, MD   400 mg at 01/22/20 2101   PTA Medications: Medications Prior to Admission  Medication Sig Dispense Refill Last Dose  . aspirin EC 325 MG tablet Take 1 tablet (325 mg total) by mouth daily. 30 tablet 0   . QUEtiapine (SEROQUEL) 200 MG tablet Take 1 tablet (200 mg total) by mouth at bedtime. 30 tablet 3     Patient Stressors: Financial difficulties Marital or family conflict Substance abuse  Patient Strengths: General fund of knowledge Physical Health  Treatment  Modalities: Medication Management, Group therapy, Case management,  1 to 1 session with clinician, Psychoeducation, Recreational therapy.   Physician Treatment Plan for Primary Diagnosis: Amphetamine and psychostimulant-induced psychosis with hallucinations (HCC) Long Term Goal(s): Improvement in symptoms so as ready for discharge Improvement in symptoms so as ready for discharge   Short Term Goals: Ability to disclose and discuss suicidal ideas Ability to demonstrate self-control will improve Compliance with prescribed medications will improve  Medication Management: Evaluate patient's response, side effects, and tolerance of medication regimen.  Therapeutic Interventions: 1 to 1 sessions, Unit Group sessions and Medication administration.  Evaluation of Outcomes: Progressing  Physician Treatment Plan for Secondary Diagnosis: Principal Problem:   Amphetamine and psychostimulant-induced psychosis with hallucinations (HCC) Active Problems:   Drug abuse (HCC)   Amphetamine abuse (HCC)   Bipolar 1 disorder (HCC)  Long Term Goal(s): Improvement in symptoms so as ready for discharge Improvement in symptoms so as ready for discharge   Short Term Goals: Ability to disclose and discuss suicidal ideas Ability to demonstrate self-control will improve Compliance with prescribed medications will improve     Medication Management: Evaluate patient's response, side effects, and tolerance of medication regimen.  Therapeutic Interventions: 1 to 1 sessions, Unit Group sessions and Medication administration.  Evaluation of Outcomes: Progressing   RN Treatment Plan for Primary Diagnosis: Amphetamine and psychostimulant-induced psychosis with hallucinations (HCC) Long Term Goal(s): Knowledge of disease and therapeutic regimen to maintain health will improve  Short Term Goals: Ability to verbalize frustration and anger appropriately will improve, Ability to demonstrate self-control, Ability to  participate in decision making will improve and Compliance with prescribed medications will improve  Medication Management: RN will administer medications as ordered by provider, will assess and evaluate patient's response and provide education to patient for prescribed medication. RN will report any adverse and/or side effects to prescribing provider.  Therapeutic Interventions: 1 on 1 counseling sessions, Psychoeducation, Medication administration, Evaluate responses to treatment, Monitor vital signs and CBGs as ordered, Perform/monitor CIWA, COWS, AIMS and Fall Risk screenings as ordered, Perform wound care treatments as ordered.  Evaluation of Outcomes: Progressing   LCSW Treatment Plan for Primary Diagnosis: Amphetamine and psychostimulant-induced psychosis with hallucinations ([place]) Long Term Goal(s): Safe transition to appropriate next level of care at discharge, Engage patient in therapeutic group addressing interpersonal concerns.  Short Term Goals: Engage patient in aftercare planning with referrals and resources, Increase ability to appropriately verbalize feelings, Facilitate acceptance of mental health diagnosis and concerns and Increase skills for wellness and recovery  Therapeutic Interventions: Assess for all discharge needs, 1 to 1 time with Social worker, Explore available resources and support systems, Assess for adequacy in community support network, Educate family and significant other(s) on suicide prevention, Complete Psychosocial Assessment, Interpersonal group therapy.  Evaluation of Outcomes: Progressing   Progress in Treatment: Attending groups: No. Participating in groups: No. Taking medication as prescribed: Yes. Toleration medication: Yes. Family/Significant other contact made: Yes, individual(s) contacted:  with pt; declined family contact Patient understands diagnosis: No. Discussing patient identified problems/goals with staff: No. Medical problems  stabilized or resolved: Yes. Denies suicidal/homicidal ideation: Yes. Issues/concerns per patient self-inventory: No. Other: N/A  New problem(s) identified: No, Describe:  none  New Short Term/Long Term Goal(s): Detox, elimination of AVH/symptoms of psychosis, medication management for mood stabilization; elimination of SI thoughts; development of comprehensive mental wellness/sobriety plan.   Patient Goals:  Patient invited to treatment team and refused to attend. No goal obtained  Discharge Plan or Barriers: SPE pamphlet, Mobile Crisis information, and AA/NA information provided to patient for additional community support and resources. CSW assessing for appropriate referrals. At this time pt has been refusing to speak with CSW staff and complete PSA. Update 01/23/20-Patient has declined outpatient treatment and shelter resources. Patient is homeless and discharge plan TBD.  Reason for Continuation of Hospitalization: Medication stabilization  Estimated Length of Stay: TBD  Recreational Therapy: Patient Stressors: N/A Patient Goal: Patient will engage in groups without prompting or encouragement from LRT x3 group sessions within 5 recreation therapy group sessions   Attendees: Patient:  01/23/2020 11:29 AM  Physician:  Dr Weber Cooks MD 01/23/2020 11:29 AM  Nursing: Oren Beckmann [place] 01/23/2020 11:29 AM  RN Care Manager: 01/23/2020 11:29 AM  Social Worker: Anise Salvo 01/23/2020 11:29 AM  Recreational Therapist:  01/23/2020 11:29 AM  Other: 01/23/2020 11:29 AM  Other:  01/23/2020 11:29 AM  Other: 01/23/2020 11:29 AM    Scribe for Treatment Team: Yvette Rack, LCSW 01/23/2020 11:29 AM

## 2020-01-23 NOTE — [facility] Group Notes (Signed)
[facility] Group Notes:  (Nursing/MHT/Case Management/Adjunct)  Date:  01/23/2020  Time:  9:13 AM  Type of Therapy:  Community Meeting  Participation Level:  Did Not Attend   [name] [facility] 01/23/2020, 9:13 AM

## 2020-01-23 NOTE — Progress Notes (Signed)
Recreation Therapy Notes  Date: 01/23/2020  Time: 9:30 am   Location: Craft room   Behavioral response: N/A   Intervention Topic: Goals  Discussion/Intervention: Patient did not attend group.   Clinical Observations/Feedback:  Patient did not attend group.   Murice Barbar LRT/CTRS         Jennalyn Cawley 01/23/2020 12:22 PM

## 2020-01-23 NOTE — [facility] Group Notes (Signed)
LCSW Group Therapy Note  01/23/2020 1:00 PM  Type of Therapy/Topic:  Group Therapy:  Feelings about Diagnosis  Participation Level:  Minimal   Description of Group:   This group will allow patients to explore their thoughts and feelings about diagnoses they have received. Patients will be guided to explore their level of understanding and acceptance of these diagnoses. Facilitator will encourage patients to process their thoughts and feelings about the reactions of others to their diagnosis and will guide patients in identifying ways to discuss their diagnosis with significant others in their lives. This group will be process-oriented, with patients participating in exploration of their own experiences, giving and receiving support, and processing challenge from other group members.   Therapeutic Goals: 1. Patient will demonstrate understanding of diagnosis as evidenced by identifying two or more symptoms of the disorder 2. Patient will be able to express two feelings regarding the diagnosis 3. Patient will demonstrate their ability to communicate their needs through discussion and/or role play  Summary of Patient Progress: Patient was present for group.  Patient appeared to be answering the question posed by the CSW, however the responses were mumbled and patient kept eyes downcast.  This appeared to be by choice as CSW evidenced the patient look up and talk to other group members on occasion.     Therapeutic Modalities:   Cognitive Behavioral Therapy Brief Therapy Feelings Identification   Penni Homans, MSW, LCSW 01/23/2020 2:28 PM

## 2020-01-23 NOTE — Progress Notes (Signed)
Patient was pleasant and cooperative. Engaging well with peers on the unit and participated in evening group session. Accepted prescribed medications and tolerated without incident. Denies SI/HI/AVH and depression. Does endorse anxiety and was upset that the Ativan had been discontinued. Reported that he will readdress the issue with the doctor in the morning. Received Tylenol for a toothache rating his pain at 8 on 0-10 scale. He was informed to contact staff with any concerns and remains safe on the unit with 15 minute safety checks.

## 2020-01-23 NOTE — Plan of Care (Signed)
Patient is pleasant and cooperative on approach.Patient states " I feel better now but I don't know how long does it stay." Patient stated that he need help to find a place for him to stay.Denies SI,HI and AVH.Compliant with medications.Attended groups.Appetite and energy level good.Support and encouragement given.

## 2020-01-23 NOTE — Progress Notes (Signed)
[facility] MD Progress Note  01/23/2020 2:50 PM [name]  MRN:  [id number] Subjective: Follow-up for this 44 year old man with bipolar disorder and drug abuse.  Patient today has been up out of bed all day and he has showered and is neatly dressed.  He tells me that today he is feeling pretty good.  This is a remarkable change from the last several days on which he would only tell me every day that he felt terrible.  Lithium level came back and is low at 0.33.  Patient is however compliant with medicine.  Today he wanted to ask me whether there would be any chance we could help him to get into a longer term program as he remains homeless.  He also told me that he wanted to get checked for "diseases" and gestured towards his groin.  I asked him if he was having any symptoms of sexually transmitted diseases and he denied it.  I offered him to recheck an HIV test which has not been done since 2018 and he is agreeable.  He knows that he has hepatitis C.  Not having any symptoms of it but has never had it treated. Principal Problem: Amphetamine and psychostimulant-induced psychosis with hallucinations ([place]) Diagnosis: Principal Problem:   Amphetamine and psychostimulant-induced psychosis with hallucinations ([place]) Active Problems:   Drug abuse ([place])   Amphetamine abuse ([place])   Bipolar 1 disorder ([place])  Total Time spent with patient: 30 minutes  Past Psychiatric History: Patient has a history of bipolar disorder with presentations of both manic and depressive symptoms  Past Medical History:  Past Medical History:  Diagnosis Date  . 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 Anxiety   . Bipolar disorder ([place])   . Depression   . Drug abuse ([place])   . Hepatitis C   . Hepatitis C antibody positive in blood 09/08/2017  . Marijuana abuse 09/07/2017  . Sinus congestion     Past Surgical History:  Procedure Laterality Date  . ANKLE SURGERY    . HERNIA REPAIR    . HERNIA REPAIR    . TENDON REPAIR    .  TIBIA IM NAIL INSERTION Left 09/06/2017   Procedure: INTRAMEDULLARY (IM) NAIL TIBIAL LEFT;  Surgeon: Altamese [place], MD;  Location: [place];  Service: Orthopedics;  Laterality: Left;   Family History:  Family History  Problem Relation Age of Onset  . Heart failure Mother    Family Psychiatric  History: See previous Social History:  Social History   Substance and Sexual Activity  Alcohol Use Yes   Comment: reports weekend use (fri/sat night)     Social History   Substance and Sexual Activity  Drug Use Yes  . Frequency: 3.0 times per week  . Types: Marijuana, Methamphetamines    Social History   Socioeconomic History  . Marital status: Single    Spouse name: Not on file  . Number of children: Not on file  . Years of education: Not on file  . Highest education level: Not on file  Occupational History  . Not on file  Tobacco Use  . Smoking status: Current Every Day Smoker    Packs/day: 1.00    Types: Cigarettes  . Smokeless tobacco: Current User  Substance and Sexual Activity  . Alcohol use: Yes    Comment: reports weekend use (fri/sat night)  . Drug use: Yes    Frequency: 3.0 times per week    Types: Marijuana, Methamphetamines  . Sexual activity: Not on file  Other Topics Concern  . Not on file  Social History Narrative   ** Merged History Encounter **       Social Determinants of Health   Financial Resource Strain:   . Difficulty of Paying Living Expenses:   Food Insecurity:   . Worried About Programme researcher, broadcasting/film/video in the Last Year:   . Barista in the Last Year:   Transportation Needs:   . Freight forwarder (Medical):   Marland Kitchen Lack of Transportation (Non-Medical):   Physical Activity:   . Days of Exercise per Week:   . Minutes of Exercise per Session:   Stress:   . Feeling of Stress :   Social Connections:   . Frequency of Communication with Friends and Family:   . Frequency of Social Gatherings with Friends and Family:   . Attends Religious  Services:   . Active Member of Clubs or Organizations:   . Attends Banker Meetings:   Marland Kitchen Marital Status:    Additional Social History:                         Sleep: Fair  Appetite:  Fair  Current Medications: Current Facility-Administered Medications  Medication Dose Route Frequency Provider Last Rate Last Admin  . acetaminophen (TYLENOL) tablet 650 mg  650 mg Oral Q6H PRN Gillermo Murdoch, NP   650 mg at 01/16/20 2324  . alum & mag hydroxide-simeth (MAALOX/MYLANTA) 200-200-20 MG/5ML suspension 30 mL  30 mL Oral Q4H PRN Gillermo Murdoch, NP      . haloperidol lactate (HALDOL) injection 5 mg  5 mg Intramuscular Once Jarryd Gratz T, MD      . hydrOXYzine (ATARAX/VISTARIL) tablet 50 mg  50 mg Oral Q6H PRN Harlean Regula, Jackquline Denmark, MD   50 mg at 01/23/20 1407  . lithium carbonate (LITHOBID) CR tablet 600 mg  600 mg Oral Q12H Quinlan Mcfall T, MD      . magnesium hydroxide (MILK OF MAGNESIA) suspension 30 mL  30 mL Oral Daily PRN Gillermo Murdoch, NP      . nicotine (NICODERM CQ - dosed in mg/24 hours) patch 21 mg  21 mg Transdermal Daily Deatra Mcmahen, Jackquline Denmark, MD   21 mg at 01/23/20 0814  . QUEtiapine (SEROQUEL) tablet 400 mg  400 mg Oral QHS Antonieta Pert, MD   400 mg at 01/22/20 2101    Lab Results:  Results for orders placed or performed during the hospital encounter of 01/16/20 (from the past 48 hour(s))  Lithium level     Status: Abnormal   Collection Time: 01/23/20  8:47 AM  Result Value Ref Range   Lithium Lvl 0.33 (L) 0.60 - 1.20 mmol/L    Comment: Performed at [facility], [address] [place], [place] [address]    Blood Alcohol level:  Lab Results  Component Value Date   [facility] <10 10/01/2019   ETH <10 08/23/2018    Metabolic Disorder Labs: Lab Results  Component Value Date   HGBA1C 5.6 10/16/2016   MPG 114 10/16/2016   Lab Results  Component Value Date   PROLACTIN 9.8 05/16/2017   PROLACTIN 13.9 10/16/2016   Lab Results   Component Value Date   CHOL 122 05/16/2017   TRIG 101 05/16/2017   HDL 45 05/16/2017   CHOLHDL 2.7 05/16/2017   VLDL 20 05/16/2017   LDLCALC 57 05/16/2017   LDLCALC 90 10/16/2016    Physical Findings: AIMS:  , ,  ,  ,  CIWA:    COWS:     Musculoskeletal: Strength & Muscle Tone: within normal limits Gait & Station: normal Patient leans: N/A  Psychiatric Specialty Exam: Physical Exam  Nursing note and vitals reviewed. Constitutional: He appears well-developed and well-nourished.  HENT:  Head: Normocephalic and atraumatic.  Eyes: Pupils are equal, round, and reactive to light. Conjunctivae are normal.  Cardiovascular: Regular rhythm and normal heart sounds.  Respiratory: Effort normal. No respiratory distress.  GI: Soft.  Musculoskeletal:        General: Normal range of motion.     Cervical back: Normal range of motion.  Neurological: He is alert.  Skin: Skin is warm and dry.  Psychiatric: His mood appears anxious. His affect is blunt. His speech is delayed. He is slowed. Cognition and memory are impaired. He expresses impulsivity. He expresses no homicidal and no suicidal ideation.    Review of Systems  Constitutional: Negative.   HENT: Negative.   Eyes: Negative.   Respiratory: Negative.   Cardiovascular: Negative.   Gastrointestinal: Negative.   Musculoskeletal: Negative.   Skin: Negative.   Neurological: Negative.   Psychiatric/Behavioral: Positive for confusion. Negative for suicidal ideas. The patient is nervous/anxious.     Blood pressure 100/73, pulse 73, temperature 97.7 F (36.5 C), temperature source Oral, resp. rate 18, height 6\' 1"  (1.854 m), weight 78.5 kg, SpO2 98 %.Body mass index is 22.82 kg/m.  General Appearance: Casual  Eye Contact:  Minimal  Speech:  Slow  Volume:  Decreased  Mood:  Dysphoric  Affect:  Congruent  Thought Process:  Disorganized  Orientation:  Full (Time, Place, and Person)  Thought Content:  Logical  Suicidal Thoughts:   No  Homicidal Thoughts:  No  Memory:  Immediate;   Fair Recent;   Fair Remote;   Fair  Judgement:  Impaired  Insight:  Shallow  Psychomotor Activity:  Decreased  Concentration:  Concentration: Fair  Recall:  of Knowledge:  Fair  Language:  Fair  Akathisia:  No  Handed:  Right  AIMS (if indicated):     Assets:  Desire for Improvement Physical Health Resilience  ADL's:  Impaired  Cognition:  Impaired,  Mild  Sleep:  Number of Hours: 5.75     Treatment Plan Summary: Daily contact with patient to assess and evaluate symptoms and progress in treatment, Medication management and Plan Patient with bipolar disorder who has gone through manic and depressed phases and seems to be starting to level out.  Based on lithium level I am increasing his dose to 600 mg twice a day.  I have ordered a recheck of an HIV test.  Lots of encouragement offered to the patient given that he is feeling better today.  Treatment team will discuss possible options for discharge planning.  [employer], MD 01/23/2020, 2:50 PM

## 2020-01-23 NOTE — Progress Notes (Signed)
   01/23/20 1400  Clinical Encounter Type  Visited With Patient;Other (Comment)  Visit Type Initial;Spiritual support;Behavioral Health  Referral From Chaplain  Consult/Referral To Chaplain  Patient went outside and participated in group on Dealing with your thoughts and assumptions. Patient did not have a lot to say, but did respond when asked a question. Patient has a 43 year old daughter which is his motivation.

## 2020-01-24 LAB — HIV ANTIBODY (ROUTINE TESTING W REFLEX): HIV Screen 4th Generation wRfx: NONREACTIVE

## 2020-01-24 MED ORDER — CLONAZEPAM 1 MG PO TABS
1.0000 mg | ORAL_TABLET | Freq: Two times a day (BID) | ORAL | Status: DC
  Administered 2020-01-24 – 2020-01-26 (×5): 1 mg via ORAL
  Filled 2020-01-24 (×5): qty 1

## 2020-01-24 NOTE — Progress Notes (Signed)
Recreation Therapy Notes   Date: 01/24/2020  Time: 9:30 am   Location: Craft room   Behavioral response: N/A   Intervention Topic: Happiness   Discussion/Intervention: Patient did not attend group.   Clinical Observations/Feedback:  Patient did not attend group.   Kevork Joyce LRT/CTRS        [name] 01/24/2020 11:30 AM 

## 2020-01-24 NOTE — Plan of Care (Signed)
?  Problem: Group Participation ?Goal: STG - Patient will engage in interactions with peers and staff in pro-social manner at least 2x within 5 recreation therapy group sessions ?Description: STG - Patient will engage in interactions with peers and staff in pro-social manner at least 2x within 5 recreation therapy group sessions ?Outcome: Not Progressing ?  ?

## 2020-01-24 NOTE — BHH Group Notes (Signed)
Emotional Regulation 01/24/2020 1PM  Type of Therapy/Topic:  Group Therapy:  Emotion Regulation  Participation Level:  Did Not Attend   Description of Group:   The purpose of this group is to assist patients in learning to regulate negative emotions and experience positive emotions. Patients will be guided to discuss ways in which they have been vulnerable to their negative emotions. These vulnerabilities will be juxtaposed with experiences of positive emotions or situations, and patients will be challenged to use positive emotions to combat negative ones. Special emphasis will be placed on coping with negative emotions in conflict situations, and patients will process healthy conflict resolution skills.  Therapeutic Goals: 1. Patient will identify two positive emotions or experiences to reflect on in order to balance out negative emotions 2. Patient will label two or more emotions that they find the most difficult to experience 3. Patient will demonstrate positive conflict resolution skills through discussion and/or role plays  Summary of Patient Progress:       Therapeutic Modalities:   Cognitive Behavioral Therapy Feelings Identification Dialectical Behavioral Therapy   Suzan Slick, LCSW 01/24/2020 2:54 PM

## 2020-01-24 NOTE — Progress Notes (Signed)
[facility] MD Progress Note  01/24/2020 12:41 PM Foch Erian Rosengren  MRN:  [id number] Subjective: Patient seen chart reviewed.  Patient today is much more agitated than he was the last couple days.  Yesterday I had stopped his Ativan and replaced it with Vistaril.  He could not sleep last night and he is clearly more agitated today.  He does not seem to be specifically agitating for a particular medicine but truly is feeling more upset.  Thoughts are a little scattered.  Not reporting acute suicidality.  Still otherwise appears to be generally cooperative Principal Problem: Amphetamine and psychostimulant-induced psychosis with hallucinations (HCC) Diagnosis: Principal Problem:   Amphetamine and psychostimulant-induced psychosis with hallucinations (HCC) Active Problems:   Drug abuse (HCC)   Amphetamine abuse (HCC)   Bipolar 1 disorder (HCC)  Total Time spent with patient: 30 minutes  Past Psychiatric History: Past history of bipolar disorder and substance abuse  Past Medical History:  Past Medical History:  Diagnosis Date  . 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 Anxiety   . Bipolar disorder (H[facility])   . Depression   . Drug abuse (H[facility])   . Hepatitis C   . Hepatitis C antibody positive in blood 09/08/2017  . Marijuana abuse 09/07/2017  . Sinus congestion     Past Surgical History:  Procedure Laterality Date  . ANKLE SURGERY    . HERNIA REPAIR    . HERNIA REPAIR    . TENDON REPAIR    . TIBIA IM NAIL INSERTION Left 09/06/2017   Procedure: INTRAMEDULLARY (IM) NAIL TIBIAL LEFT;  Surgeon: Myrene Galas, MD;  Location: [facility];  Service: Orthopedics;  Laterality: Left;   Family History:  Family History  Problem Relation Age of Onset  . Heart failure Mother    Family Psychiatric  History: See previous Social History:  Social History   Substance and Sexual Activity  Alcohol Use Yes   Comment: reports weekend use (fri/sat night)     Social History   Substance and Sexual Activity  Drug Use  Yes  . Frequency: 3.0 times per week  . Types: Marijuana, Methamphetamines    Social History   Socioeconomic History  . Marital status: Single    Spouse name: Not on file  . Number of children: Not on file  . Years of education: Not on file  . Highest education level: Not on file  Occupational History  . Not on file  Tobacco Use  . Smoking status: Current Every Day Smoker    Packs/day: 1.00    Types: Cigarettes  . Smokeless tobacco: Current User  Substance and Sexual Activity  . Alcohol use: Yes    Comment: reports weekend use (fri/sat night)  . Drug use: Yes    Frequency: 3.0 times per week    Types: Marijuana, Methamphetamines  . Sexual activity: Not on file  Other Topics Concern  . Not on file  Social History Narrative   ** Merged History Encounter **       Social Determinants of Health   Financial Resource Strain:   . Difficulty of Paying Living Expenses:   Food Insecurity:   . Worried About Programme researcher, broadcasting/film/video in the Last Year:   . Barista in the Last Year:   Transportation Needs:   . Freight forwarder (Medical):   Marland Kitchen Lack of Transportation (Non-Medical):   Physical Activity:   . Days of Exercise per Week:   . Minutes of Exercise per Session:   Stress:   .  Feeling of Stress :   Social Connections:   . Frequency of Communication with Friends and Family:   . Frequency of Social Gatherings with Friends and Family:   . Attends Religious Services:   . Active Member of Clubs or Organizations:   . Attends Banker Meetings:   Marland Kitchen Marital Status:    Additional Social History:                         Sleep: Poor  Appetite:  Poor  Current Medications: Current Facility-Administered Medications  Medication Dose Route Frequency Provider Last Rate Last Admin  . acetaminophen (TYLENOL) tablet 650 mg  650 mg Oral Q6H PRN Gillermo Murdoch, NP   650 mg at 01/23/20 2212  . alum & mag hydroxide-simeth (MAALOX/MYLANTA) 200-200-20  MG/5ML suspension 30 mL  30 mL Oral Q4H PRN Gillermo Murdoch, NP      . clonazePAM Scarlette Calico) tablet 1 mg  1 mg Oral BID Rachel Samples T, MD   1 mg at 01/24/20 1202  . haloperidol lactate (HALDOL) injection 5 mg  5 mg Intramuscular Once Judye Lorino T, MD      . hydrOXYzine (ATARAX/VISTARIL) tablet 50 mg  50 mg Oral Q6H PRN Jervis Trapani, Jackquline Denmark, MD   50 mg at 01/24/20 1040  . lithium carbonate (LITHOBID) CR tablet 600 mg  600 mg Oral Q12H Deann Mclaine, Jackquline Denmark, MD   600 mg at 01/23/20 2136  . magnesium hydroxide (MILK OF MAGNESIA) suspension 30 mL  30 mL Oral Daily PRN Gillermo Murdoch, NP      . nicotine (NICODERM CQ - dosed in mg/24 hours) patch 21 mg  21 mg Transdermal Daily Devon Kingdon, Jackquline Denmark, MD   21 mg at 01/24/20 1043  . QUEtiapine (SEROQUEL) tablet 400 mg  400 mg Oral QHS Antonieta Pert, MD   400 mg at 01/23/20 2137    Lab Results:  Results for orders placed or performed during the hospital encounter of 01/16/20 (from the past 48 hour(s))  Lithium level     Status: Abnormal   Collection Time: 01/23/20  8:47 AM  Result Value Ref Range   Lithium Lvl 0.33 (L) 0.60 - 1.20 mmol/L    Comment: Performed at [facility], [address] [place], [place] [address]    Blood Alcohol level:  Lab Results  Component Value Date   [facility] <10 10/01/2019   ETH <10 08/23/2018    Metabolic Disorder Labs: Lab Results  Component Value Date   HGBA1C 5.6 10/16/2016   MPG 114 10/16/2016   Lab Results  Component Value Date   PROLACTIN 9.8 05/16/2017   PROLACTIN 13.9 10/16/2016   Lab Results  Component Value Date   CHOL 122 05/16/2017   TRIG 101 05/16/2017   HDL 45 05/16/2017   CHOLHDL 2.7 05/16/2017   VLDL 20 05/16/2017   LDLCALC 57 05/16/2017   LDLCALC 90 10/16/2016    Physical Findings: AIMS:  , ,  ,  ,    CIWA:    COWS:     Musculoskeletal: Strength & Muscle Tone: within normal limits Gait & Station: normal Patient leans: N/A  Psychiatric Specialty Exam: Physical  Exam  Nursing note and vitals reviewed. Constitutional: He appears well-developed and well-nourished.  HENT:  Head: Normocephalic and atraumatic.  Eyes: Pupils are equal, round, and reactive to light. Conjunctivae are normal.  Cardiovascular: Regular rhythm and normal heart sounds.  Respiratory: Effort normal. No respiratory distress.  GI: Soft.  Musculoskeletal:        General: Normal range of motion.     Cervical back: Normal range of motion.  Neurological: He is alert.  Skin: Skin is warm and dry.  Psychiatric: His mood appears anxious. His speech is rapid and/or pressured. He is agitated. He is not aggressive. Thought content is paranoid. Cognition and memory are impaired. He expresses impulsivity. He expresses no homicidal and no suicidal ideation.    Review of Systems  Constitutional: Negative.   HENT: Negative.   Eyes: Negative.   Respiratory: Negative.   Cardiovascular: Negative.   Gastrointestinal: Negative.   Musculoskeletal: Negative.   Skin: Negative.   Neurological: Negative.   Psychiatric/Behavioral: Positive for agitation, behavioral problems and sleep disturbance. The patient is nervous/anxious and is hyperactive.     Blood pressure 100/73, pulse 73, temperature 97.7 F (36.5 C), temperature source Oral, resp. rate 18, height 6\' 1"  (1.854 m), weight 78.5 kg, SpO2 98 %.Body mass index is 22.82 kg/m.  General Appearance: Casual  Eye Contact:  Minimal  Speech:  Pressured  Volume:  Decreased  Mood:  Irritable  Affect:  Constricted  Thought Process:  Coherent  Orientation:  Full (Time, Place, and Person)  Thought Content:  Logical and Paranoid Ideation  Suicidal Thoughts:  No  Homicidal Thoughts:  No  Memory:  Immediate;   Fair Recent;   Poor Remote;   Poor  Judgement:  Impaired  Insight:  Shallow  Psychomotor Activity:  Decreased and Restlessness  Concentration:  Concentration: Poor  Recall:  [place] of Knowledge:  Fair  Language:  Fair  Akathisia:   No  Handed:  Right  AIMS (if indicated):     Assets:  Desire for Improvement  ADL's:  Impaired  Cognition:  Impaired,  Mild  Sleep:  Number of Hours: 6.45     Treatment Plan Summary: Daily contact with patient to assess and evaluate symptoms and progress in treatment, Medication management and Plan Despite his history of substance abuse it seems like he really was doing much better on the Ativan.  Rather than restarting that I am going to start him on clonazepam 1 mg twice a day.  He complains of nightmares and of having a history of ADHD so I am going to start him on some prazosin at night.  Encourage patient to continue cooperating and attending groups.  Alethia Berthold, MD 01/24/2020, 12:41 PM

## 2020-01-24 NOTE — Progress Notes (Signed)
When asked about how he was feeling compared to this morning he said "a little better". He says his Klonopin helped. When I asked him what his stressors were he said that it was "to much" and he didn't know who he could talk to. When I suggested the MD, chaplain etc he refused. He says that he still feels that others can listen to his thoughts. He did apologize for being rude to me this morning. He stressed that he wanted to go to a long term program to get clean and if he did not go he would end up on the streets using again. He says that he does not have a stable environment. Torrie Mayers RN

## 2020-01-24 NOTE — Plan of Care (Signed)
Pt compliant with procedures on the unit and medication administration per MD orders. Patient active on the unit, denies SI/HI/AVH  Problem: Education: Goal: Emotional status will improve Outcome: Progressing Goal: Mental status will improve Outcome: Progressing

## 2020-01-24 NOTE — Progress Notes (Signed)
Patient was in his room upon arrival to the unit. Patient was more pleasant during assessment this evening denying SI/HI/AVH. Patient compliant with medication administration per MD orders. Patient more active on the unit this evening watching TV in the day room with other patients. Patient given education, support and encouragement to be active in his treatment plan. Patient being monitored Q 15 minutes for safety per unit protocol. Patient remains safe on the unit.  

## 2020-01-24 NOTE — BHH Counselor (Signed)
CSW met with the patient to discuss aftercare plans. Patient reports plans to attend [place] notes that patient's behaviors and moods have improved.  Assunta Curtis, MSW, LCSW 01/24/2020 2:57 PM

## 2020-01-25 MED ORDER — LITHIUM CARBONATE ER 300 MG PO TBCR: 600.0000 mg | EXTENDED_RELEASE_TABLET | Freq: Two times a day (BID) | ORAL | 0 refills | Status: AC

## 2020-01-25 MED ORDER — QUETIAPINE FUMARATE 400 MG PO TABS: 400.0000 mg | ORAL_TABLET | Freq: Every day | ORAL | 0 refills | Status: DC

## 2020-01-25 NOTE — Plan of Care (Signed)
Patient cooperative with no complaints.  Problem: Education: Goal: Emotional status will improve Outcome: Progressing Goal: Mental status will improve Outcome: Progressing

## 2020-01-25 NOTE — Progress Notes (Signed)
Recreation Therapy Notes  Date: 01/25/2020  Time: 9:30 am   Location: Room 21   Behavioral response: N/A   Intervention Topic: Animal Assisted therapy    Discussion/Intervention: Patient did not attend group.   Clinical Observations/Feedback:  Patient did not attend group.   Francies Inch LRT/CTRS        Lexey Fletes 01/25/2020 11:10 AM

## 2020-01-25 NOTE — Progress Notes (Signed)
F - Improve mood and prepare for transfer to CD facility.  D - The patient awakened for breakfast, accepted morning medications, and cooperated with staff.  His mood was stable and [name] denied thoughts of harming himself.  The patient did not complete a PSI-A or attend groups.  He was observed talking to himself and spent the majority of his morning/afternoon in his room.  [name] did not report any concerns or make any complaints.  Food and fluid intake adequate.    A - Medications provided as ordered.  CIWA/COWS completed as ordered with no significant results.  [name] was encouraged to participate in milieu activities.    R - Continue with treatment.

## 2020-01-25 NOTE — BHH Counselor (Signed)
CSW provided pt with information on [employer].  Penni Homans, MSW, LCSW 01/25/2020 2:32 PM

## 2020-01-25 NOTE — Progress Notes (Signed)
BRIEF PHARMACY NOTE   This patient attended and participated in Medication Management Group counseling led by [facility] staff pharmacist.  This interactive class reviews basic information about prescription medications and education on personal responsibility in medication management.  The class also includes general knowledge of 3 main classes of behavioral medications, including antipsychotics, antidepressants, and mood stabilizers.     Patient behavior was appropriate for group setting.   Educational materials sourced from:  "Medication Do's and Don'ts" from WWW.MED-PASS.COM   "Mental Health Medications" from National Institute of Mental Health Https://www.nimh.nih.gov/health/topics/mental-health-medications/index.shtml#part 149856     Kolton Kienle M Haly Feher ,PharmD 01/25/2020 , 3:05 PM  

## 2020-01-25 NOTE — Progress Notes (Signed)
[facility] MD Progress Note  01/25/2020 12:41 PM [name]  MRN:  [id number] Subjective: Patient seen chart reviewed.  Patient reports he is feeling better today.  Slept well last night.  Affect is still a little jittery but he is calm and mostly able to sit still.  We discussed his ADHD and I talked with him about the difficulty of treating it given his history of amphetamine abuse.  Patient is understanding but will follow that up as an outpatient especially if he can stay clean.  Right now he seems to be doing pretty well with his mood not dangerous and not psychotic.  We are talking about trying to get a discharge plan together for tomorrow. Principal Problem: Amphetamine and psychostimulant-induced psychosis with hallucinations ([place]) Diagnosis: Principal Problem:   Amphetamine and psychostimulant-induced psychosis with hallucinations ([place]) Active Problems:   Drug abuse ([place])   Amphetamine abuse ([place])   Bipolar 1 disorder (HCC)  Total Time spent with patient: 30 minutes  Past Psychiatric History: Past history of substance abuse and mood disorder  Past Medical History:  Past Medical History:  Diagnosis Date  . Amphetamine abuse ([place]) 09/07/2017  . Anxiety   . Bipolar disorder ([place])   . Depression   . Drug abuse ([place])   . Hepatitis C   . Hepatitis C antibody positive in blood 09/08/2017  . Marijuana abuse 09/07/2017  . Sinus congestion     Past Surgical History:  Procedure Laterality Date  . ANKLE SURGERY    . HERNIA REPAIR    . HERNIA REPAIR    . TENDON REPAIR    . TIBIA IM NAIL INSERTION Left 09/06/2017   Procedure: INTRAMEDULLARY (IM) NAIL TIBIAL LEFT;  Surgeon: Altamese Tice, MD;  Location: [place];  Service: Orthopedics;  Laterality: Left;   Family History:  Family History  Problem Relation Age of Onset  . Heart failure Mother    Family Psychiatric  History: See previous Social History:  Social History   Substance and Sexual Activity  Alcohol Use Yes   Comment:  reports weekend use (fri/sat night)     Social History   Substance and Sexual Activity  Drug Use Yes  . Frequency: 3.0 times per week  . Types: Marijuana, Methamphetamines    Social History   Socioeconomic History  . Marital status: Single    Spouse name: Not on file  . Number of children: Not on file  . Years of education: Not on file  . Highest education level: Not on file  Occupational History  . Not on file  Tobacco Use  . Smoking status: Current Every Day Smoker    Packs/day: 1.00    Types: Cigarettes  . Smokeless tobacco: Current User  Substance and Sexual Activity  . Alcohol use: Yes    Comment: reports weekend use (fri/sat night)  . Drug use: Yes    Frequency: 3.0 times per week    Types: Marijuana, Methamphetamines  . Sexual activity: Not on file  Other Topics Concern  . Not on file  Social History Narrative   ** Merged History Encounter **       Social Determinants of Health   Financial Resource Strain:   . Difficulty of Paying Living Expenses:   Food Insecurity:   . Worried About Charity fundraiser in the Last Year:   . Arboriculturist in the Last Year:   Transportation Needs:   . Film/video editor (Medical):   Marland Kitchen Lack of Transportation (  Non-Medical):   Physical Activity:   . Days of Exercise per Week:   . Minutes of Exercise per Session:   Stress:   . Feeling of Stress :   Social Connections:   . Frequency of Communication with Friends and Family:   . Frequency of Social Gatherings with Friends and Family:   . Attends Religious Services:   . Active Member of Clubs or Organizations:   . Attends Banker Meetings:   Marland Kitchen Marital Status:    Additional Social History:                         Sleep: Fair  Appetite:  Fair  Current Medications: Current Facility-Administered Medications  Medication Dose Route Frequency Provider Last Rate Last Admin  . acetaminophen (TYLENOL) tablet 650 mg  650 mg Oral Q6H PRN Gillermo Murdoch, NP   650 mg at 01/23/20 2212  . alum & mag hydroxide-simeth (MAALOX/MYLANTA) 200-200-20 MG/5ML suspension 30 mL  30 mL Oral Q4H PRN Gillermo Murdoch, NP   30 mL at 01/24/20 1826  . clonazePAM (KLONOPIN) tablet 1 mg  1 mg Oral BID Joshia Kitchings T, MD   1 mg at 01/25/20 0810  . haloperidol lactate (HALDOL) injection 5 mg  5 mg Intramuscular Once Ceylin Dreibelbis T, MD      . hydrOXYzine (ATARAX/VISTARIL) tablet 50 mg  50 mg Oral Q6H PRN Gilberta Peeters, Jackquline Denmark, MD   50 mg at 01/24/20 1040  . lithium carbonate (LITHOBID) CR tablet 600 mg  600 mg Oral Q12H Janei Scheff, Jackquline Denmark, MD   600 mg at 01/25/20 0810  . magnesium hydroxide (MILK OF MAGNESIA) suspension 30 mL  30 mL Oral Daily PRN Gillermo Murdoch, NP      . nicotine (NICODERM CQ - dosed in mg/24 hours) patch 21 mg  21 mg Transdermal Daily Samariah Hokenson, Jackquline Denmark, MD   21 mg at 01/25/20 0810  . QUEtiapine (SEROQUEL) tablet 400 mg  400 mg Oral QHS Antonieta Pert, MD   400 mg at 01/24/20 2108    Lab Results:  Results for orders placed or performed during the hospital encounter of 01/16/20 (from the past 48 hour(s))  HIV Antibody (routine testing w rflx)     Status: None   Collection Time: 01/24/20 10:30 AM  Result Value Ref Range   HIV Screen 4th Generation wRfx NON REACTIVE NON REACTIVE    Comment: Performed at [facility], [address] [place], [place] [address]    Blood Alcohol level:  Lab Results  Component Value Date   ETH <10 10/01/2019   ETH <10 08/23/2018    Metabolic Disorder Labs: Lab Results  Component Value Date   HGBA1C 5.6 10/16/2016   MPG 114 10/16/2016   Lab Results  Component Value Date   PROLACTIN 9.8 05/16/2017   PROLACTIN 13.9 10/16/2016   Lab Results  Component Value Date   CHOL 122 05/16/2017   TRIG 101 05/16/2017   HDL 45 05/16/2017   CHOLHDL 2.7 05/16/2017   VLDL 20 05/16/2017   LDLCALC 57 05/16/2017   LDLCALC 90 10/16/2016    Physical Findings: AIMS:  , ,  ,  ,    CIWA:    COWS:      Musculoskeletal: Strength & Muscle Tone: within normal limits Gait & Station: normal Patient leans: N/A  Psychiatric Specialty Exam: Physical Exam  Nursing note and vitals reviewed. Constitutional: He appears well-developed and well-nourished.  HENT:  Head: Normocephalic and atraumatic.  Eyes: Pupils are equal, round, and reactive to light. Conjunctivae are normal.  Cardiovascular: Regular rhythm and normal heart sounds.  Respiratory: Effort normal.  GI: Soft.  Musculoskeletal:        General: Normal range of motion.     Cervical back: Normal range of motion.  Neurological: He is alert.  Skin: Skin is warm and dry.  Psychiatric: His speech is normal. Judgment and thought content normal. His mood appears anxious. He is agitated. He is not aggressive. Cognition and memory are normal.    Review of Systems  Constitutional: Negative.   HENT: Negative.   Eyes: Negative.   Respiratory: Negative.   Cardiovascular: Negative.   Gastrointestinal: Negative.   Musculoskeletal: Negative.   Skin: Negative.   Neurological: Negative.   Psychiatric/Behavioral: Negative for suicidal ideas. The patient is nervous/anxious and is hyperactive.     Blood pressure 111/77, pulse 81, temperature 97.7 F (36.5 C), temperature source Oral, resp. rate 18, height 6\' 1"  (1.854 m), weight 78.5 kg, SpO2 100 %.Body mass index is 22.82 kg/m.  General Appearance: Casual  Eye Contact:  Fair  Speech:  Clear and Coherent  Volume:  Normal  Mood:  Anxious  Affect:  Constricted  Thought Process:  Coherent  Orientation:  Full (Time, Place, and Person)  Thought Content:  Logical  Suicidal Thoughts:  No  Homicidal Thoughts:  No  Memory:  Immediate;   Fair Recent;   Fair Remote;   Fair  Judgement:  Fair  Insight:  Fair  Psychomotor Activity:  Normal  Concentration:  Concentration: Fair  Recall:  of Knowledge:  Fair  Language:  Fair  Akathisia:  No  Handed:  Right  AIMS (if indicated):      Assets:  Desire for Improvement Physical Health Resilience Social Support  ADL's:  Intact  Cognition:  WNL  Sleep:  Number of Hours: 6.75     Treatment Plan Summary: Daily contact with patient to assess and evaluate symptoms and progress in treatment, Medication management and Plan No change to medicine for today.  We will make arrangements for a possible 7-day supply as we may be able to arrange for discharge as early as tomorrow.  Encourage group attendance.  [employer], MD 01/25/2020, 12:41 PM

## 2020-01-25 NOTE — [facility] Group Notes (Signed)
[facility] Group Notes:  (Nursing/MHT/Case Management/Adjunct)  Date:  01/25/2020  Time:  2:36 PM  Type of Therapy:  Community Meeting  Participation Level:  Did Not Attend   Lynelle Smoke [facility] 01/25/2020, 2:36 PM

## 2020-01-25 NOTE — B[facility] Counselor (Signed)
LCSW Group Therapy Note  01/25/2020 1:00 PM  Type of Therapy/Topic:  Group Therapy:  Balance in Life  Participation Level:  Active  Description of Group:    This group will address the concept of balance and how it feels and looks when one is unbalanced. Patients will be encouraged to process areas in their lives that are out of balance and identify reasons for remaining unbalanced. Facilitators will guide patients in utilizing problem-solving interventions to address and correct the stressor making their life unbalanced. Understanding and applying boundaries will be explored and addressed for obtaining and maintaining a balanced life. Patients will be encouraged to explore ways to assertively make their unbalanced needs known to significant others in their lives, using other group members and facilitator for support and feedback.  Therapeutic Goals: 1. Patient will identify two or more emotions or situations they have that consume much of in their lives. 2. Patient will identify signs/triggers that life has become out of balance:  3. Patient will identify two ways to set boundaries in order to achieve balance in their lives:  4. Patient will demonstrate ability to communicate their needs through discussion and/or role plays  Summary of Patient Progress: Patient was present in group.  Patient was an active participant.  Patient shared that he typically is a "fight" person when presented with fight or flee.  He reports that he thinks of the beach as a calming relaxing place and that the sounds of the ocean are relaxing.    Therapeutic Modalities:   Cognitive Behavioral Therapy Solution-Focused Therapy Assertiveness Training  Penni Homans MSW, [place] 01/25/2020 2:29 PM

## 2020-01-26 MED ORDER — CLOTRIMAZOLE 1 % EX CREA: TOPICAL_CREAM | Freq: Two times a day (BID) | CUTANEOUS | 0 refills | Status: DC

## 2020-01-26 MED ORDER — TOLNAFTATE 1 % EX CREA
TOPICAL_CREAM | Freq: Two times a day (BID) | CUTANEOUS | Status: DC
  Filled 2020-01-26: qty 30

## 2020-01-26 MED ORDER — CLOTRIMAZOLE 1 % EX CREA
TOPICAL_CREAM | Freq: Two times a day (BID) | CUTANEOUS | Status: DC
  Filled 2020-01-26: qty 15

## 2020-01-26 MED ORDER — TOLNAFTATE 1 % EX CREA: TOPICAL_CREAM | Freq: Two times a day (BID) | CUTANEOUS | 0 refills | Status: DC

## 2020-01-26 NOTE — Discharge Summary (Signed)
Physician Discharge Summary Note  Patient:  [name] is an 44 y.o., male MRN:  [id number] DOB:  Apr 11, 1976 Patient phone:  [phone] (home)  Patient address:   [address] [address],  Total Time spent with patient: 45 minutes  Date of Admission:  01/16/2020 Date of Discharge: 01/26/2020  Reason for Admission:  Patient seen and chart reviewed.  Patient known from previous encounters.  44 year old man with a history of substance abuse agitation and possible bipolar disorder.  He presented to the emergency room yesterday very agitated and disorganized and endorsing several days of methamphetamine binging.  He was given enough medicine that I think he slept some last night although he is still quite agitated today.  Patient tells me that he has been out doing his usual thing which is living from house to house sometimes with family sometimes with others and that he has had about a 4-day binge of methamphetamine.  He says additionally he will take a Xanax now and then.  Those are definitely his 2 favorite drugs.  Had made suicidal statements in the emergency room which is something he has been known to do especially when intoxicated.  On interview today he says that of course he has some suicidal thoughts at times but he does not have any intention to kill himself.  Denies homicidal ideation.  He does make a few psychotic statements mentioning how he still hears voices and feels paranoid.  Patient was actually despite pressured speech and difficulty sitting still fairly cooperative and pleasant during the interview. Associated Signs/Symptoms: Depression Symptoms:  psychomotor agitation, difficulty concentrating, suicidal thoughts without plan, (Hypo) Manic Symptoms:  Elevated Mood, Flight of Ideas, Impulsivity, Anxiety Symptoms:  Excessive Worry, Psychotic Symptoms:  Hallucinations: Auditory Paranoia, PTSD Symptoms: Negative   Past Psychiatric History: Patient has a  history of frequent presentation going back years with similar sorts of behavior.  Long history of amphetamine abuse.  According to the patient he was treated for ADHD when he was much younger and he continues to insist that his main problem is ADHD and that if someone were only to prescribe him Adderall everything would be fine.  I pointed out to him as I have in the past that no one is going to give him Adderall while he is actively abusing methamphetamine.  There is no known actual suicide attempts in the past.  Patient has been put on antipsychotics and other medicines in an attempt to treat bipolar disorder but has never really been compliant outside the hospital.  Principal Problem: Bipolar 1 disorder [facility]) Discharge Diagnoses: Principal Problem:   Bipolar 1 disorder (HCC) Active Problems:   Drug abuse (HCC)   Amphetamine abuse (HCC)   Amphetamine and psychostimulant-induced psychosis with hallucinations (HCC)  Past Medical History:  Past Medical History:  Diagnosis Date  . 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 Anxiety   . Bipolar disorder (HCC)   . Depression   . Drug abuse (HCC)   . Hepatitis C   . Hepatitis C antibody positive in blood 09/08/2017  . Marijuana abuse 09/07/2017  . Sinus congestion     Past Surgical History:  Procedure Laterality Date  . ANKLE SURGERY    . HERNIA REPAIR    . HERNIA REPAIR    . TENDON REPAIR    . TIBIA IM NAIL INSERTION Left 09/06/2017   Procedure: INTRAMEDULLARY (IM) NAIL TIBIAL LEFT;  Surgeon: Myrene Galas, MD;  Location: [facility];  Service: Orthopedics;  Laterality:  Left;   Family History:  Family History  Problem Relation Age of Onset  . Heart failure Mother    Family Psychiatric  History: Denies Social History:  Social History   Substance and Sexual Activity  Alcohol Use Yes   Comment: reports weekend use (fri/sat night)     Social History   Substance and Sexual Activity  Drug Use Yes  . Frequency: 3.0 times per week  . Types:  Marijuana, Methamphetamines    Social History   Socioeconomic History  . Marital status: Single    Spouse name: Not on file  . Number of children: Not on file  . Years of education: Not on file  . Highest education level: Not on file  Occupational History  . Not on file  Tobacco Use  . Smoking status: Current Every Day Smoker    Packs/day: 1.00    Types: Cigarettes  . Smokeless tobacco: Current User  Substance and Sexual Activity  . Alcohol use: Yes    Comment: reports weekend use (fri/sat night)  . Drug use: Yes    Frequency: 3.0 times per week    Types: Marijuana, Methamphetamines  . Sexual activity: Not on file  Other Topics Concern  . Not on file  Social History Narrative   ** Merged History Encounter **       Social Determinants of Health   Financial Resource Strain:   . Difficulty of Paying Living Expenses:   Food Insecurity:   . Worried About Programme researcher, broadcasting/film/video in the Last Year:   . Barista in the Last Year:   Transportation Needs:   . Freight forwarder (Medical):   Marland Kitchen Lack of Transportation (Non-Medical):   Physical Activity:   . Days of Exercise per Week:   . Minutes of Exercise per Session:   Stress:   . Feeling of Stress :   Social Connections:   . Frequency of Communication with Friends and Family:   . Frequency of Social Gatherings with Friends and Family:   . Attends Religious Services:   . Active Member of Clubs or Organizations:   . Attends Banker Meetings:   Marland Kitchen Marital Status:     Hospital Course:  [name] was admitted for Bipolar 1 disorder [facility]) and crisis management.  He was treated with the following medications Seroquel 400mg  po QHS, Lithium CR 600mg  po q12 hours, Hydroxyzine 50mg  Q6hr prn. During this admission he was treated with Klonopin 1mg  po BID for anxiety, and Haldol IM injections prn for agitation and psychosis.   [name] was discharged with current medication and was instructed on how to  take medications as prescribed; (details listed below under Medication List).  Medical problems were identified and treated as needed.  Home medications were restarted as appropriate. Labs obtained were determined to be within normal. Last Lithium level was 0.33 on 01/23/20.   Improvement was monitored by observation and daily report of symptom reduction.  Emotional and mental status was monitored by daily self-inventory reports completed by and clinical staff.         [name] was evaluated by the treatment team for stability and plans for continued recovery upon discharge.  [name] motivation was an integral factor for scheduling further treatment.  Employment, transportation, bed availability, health status, family support, and any pending legal issues were also considered during his hospital stay.  He was offered further treatment  options upon discharge including but not limited to Residential, Intensive Outpatient, and Outpatient treatment.  [name] will follow up with the services as listed below under Follow Up Information.     Upon completion of this admission the [facility] was both mentally and medically stable for discharge denying suicidal/homicidal ideation, auditory/visual/tactile hallucinations, delusional thoughts and paranoia.     Musculoskeletal: Strength & Muscle Tone: within normal limits Gait & Station: normal Patient leans: N/A  Psychiatric Specialty Exam: See MD SRA Physical Exam  Review of Systems  Blood pressure 111/77, pulse 81, temperature 97.7 F (36.5 C), temperature source Oral, resp. rate 18, height 6\' 1"  (1.854 m), weight 78.5 kg, SpO2 100 %.Body mass index is 22.82 kg/m.  Sleep:  Number of Hours: 6.5     Have you used any form of tobacco in the last 30 days? (Cigarettes, Smokeless Tobacco, Cigars, and/or Pipes): Yes  Has this patient used any form of tobacco in the last 30 days? (Cigarettes,  Smokeless Tobacco, Cigars, and/or Pipes)  No  Blood Alcohol level:  Lab Results  Component Value Date   ETH <10 10/01/2019   ETH <10 09/32/6712    Metabolic Disorder Labs:  Lab Results  Component Value Date   HGBA1C 5.6 10/16/2016   MPG 114 10/16/2016   Lab Results  Component Value Date   PROLACTIN 9.8 05/16/2017   PROLACTIN 13.9 10/16/2016   Lab Results  Component Value Date   CHOL 122 05/16/2017   TRIG 101 05/16/2017   HDL 45 05/16/2017   CHOLHDL 2.7 05/16/2017   VLDL 20 05/16/2017   LDLCALC 57 05/16/2017   [place] 90 10/16/2016    See Psychiatric Specialty Exam and Suicide Risk Assessment completed by Attending Physician prior to discharge.  Discharge destination:  Home  Is patient on multiple antipsychotic therapies at discharge:  No   Has Patient had three or more failed trials of antipsychotic monotherapy by history:  No  Recommended Plan for Multiple Antipsychotic Therapies: NA  Discharge Instructions    Discharge instructions   Complete by: As directed    Please continue to take medications as directed. If your symptoms return, worsen, or persist please call your 911, report to local ER, or contact crisis hotline. Please do not drink alcohol or use any illegal substances while taking prescription medications.     Allergies as of 01/26/2020      Reactions   Tramadol Nausea And Vomiting      Medication List    STOP taking these medications   aspirin EC 325 MG tablet     TAKE these medications     Indication  clotrimazole 1 % cream Commonly known as: LOTRIMIN Apply topically 2 (two) times daily. To foot  Indication: Athlete's Foot   lithium carbonate 300 MG CR tablet Commonly known as: LITHOBID Take 2 tablets (600 mg total) by mouth every 12 (twelve) hours.  Indication: Manic-Depression   QUEtiapine 400 MG tablet Commonly known as: SEROQUEL Take 1 tablet (400 mg total) by mouth at bedtime. What changed:   medication strength  how much  to take  Indication: Depressive Phase of Manic-Depression, Manic Phase of Manic-Depression   tolnaftate 1 % cream Commonly known as: TINACTIN Apply topically 2 (two) times daily.  Indication: Athlete's Foot      Follow-up [place] Follow up.   Why: Please go to the walk in clinic on Monday-Friday at 830am. Please bring photo ID, hospital discharge paperwork,  insurance card. Thank you. Contact information: [address] [place] [address] [phone] Fax: [phone]          Follow-up recommendations:  Activity:  Increase activity as tolerated.  Tests:  Routine testing as recommended by outpatient psychiatrist. Bonita Quin are taking Lithium which is a mood stabilizer, and requires routine labs draws. Your next Lab draw should be on Monday to reassess your Lithium levels.  Other:  [facility] if you begin to feel better continue taking your current medications.   Comments:  Lithium level obtained on 01/23/20 was 0.33  Signed: Maryagnes Amos, FNP 01/26/2020, 11:15 AM

## 2020-01-26 NOTE — Progress Notes (Signed)
Patient was in his room upon arrival to the unit. Patient was more pleasant during assessment this evening denying SI/HI/AVH. Patient compliant with medication administration per MD orders. Patient more active on the unit this evening watching TV in the day room with other patients. Patient given education, support and encouragement to be active in his treatment plan. Patient being monitored Q 15 minutes for safety per unit protocol. Patient remains safe on the unit.Patient continues to improve and is hopeful for he will be leaving on Monday.

## 2020-01-26 NOTE — Plan of Care (Signed)
  Problem: Group Participation Goal: STG - Patient will engage in interactions with peers and staff in pro-social manner at least 2x within 5 recreation therapy group sessions Description: STG - Patient will engage in interactions with peers and staff in pro-social manner at least 2x within 5 recreation therapy group sessions 01/26/2020 1222 by Ernest Haber, LRT Outcome: Not Applicable 12/24/2246 2500 by Ernest Haber, LRT Outcome: Not Met (add Reason) Note: Patient spent most of time in his room.

## 2020-01-26 NOTE — Progress Notes (Signed)
  [facility] Adult Case Management Discharge Plan :  Will you be returning to the same living situation after discharge:  No. At discharge, do you have transportation home?: Yes,  CSW will assist with transportation needs.  Do you have the ability to pay for your medications: No.  Release of information consent forms completed and in the chart;  Patient's signature needed at discharge.  Patient to Follow up at: Follow-up Information    [facility] Follow up.   Why: Please go to the walk in clinic on Monday-Friday at 830am. Please bring photo ID, hospital discharge paperwork, insurance card. Thank you. Contact information: [address] [place] [address] [phone] Fax: [phone]          Next level of care provider has access to [facility]:no  Safety Planning and Suicide Prevention discussed: No.  SPE completed with patient. Patient declined family/friend contact.  Have you used any form of tobacco in the last 30 days? (Cigarettes, Smokeless Tobacco, Cigars, and/or Pipes): Yes  Has patient been referred to the Quitline?: Patient refused referral  Patient has been referred for addiction treatment: Yes  Harden Mo, LCSW 01/26/2020, 10:31 AM

## 2020-01-26 NOTE — Plan of Care (Signed)
Patient pleasant and cooperative on the unit. Pt denies SI/HI/AVH  Problem: Education: Goal: Emotional status will improve Outcome: Progressing Goal: Mental status will improve Outcome: Progressing

## 2020-01-26 NOTE — BHH Suicide Risk Assessment (Signed)
[facility] Discharge Suicide Risk Assessment   Principal Problem: Bipolar 1 disorder [facility]) Discharge Diagnoses: Principal Problem:   Bipolar 1 disorder (HCC) Active Problems:   Drug abuse (HCC)   Amphetamine abuse (HCC)   Amphetamine and psychostimulant-induced psychosis with hallucinations (HCC)   Total Time spent with patient: 30 minutes  Musculoskeletal: Strength & Muscle Tone: within normal limits Gait & Station: normal Patient leans: N/A  Psychiatric Specialty Exam: Review of Systems  Blood pressure 111/77, pulse 81, temperature 97.7 F (36.5 C), temperature source Oral, resp. rate 18, height 6\' 1"  (1.854 m), weight 78.5 kg, SpO2 100 %.Body mass index is 22.82 kg/m.  General Appearance: Casual  Eye Contact::  Fair  Speech:  Clear and Coherent409  Volume:  Normal  Mood:  Euthymic  Affect:  Constricted  Thought Process:  Coherent  Orientation:  Full (Time, Place, and Person)  Thought Content:  Logical  Suicidal Thoughts:  No  Homicidal Thoughts:  No  Memory:  Immediate;   Fair Recent;   Fair Remote;   Fair  Judgement:  Fair  Insight:  Fair  Psychomotor Activity:  Normal  Concentration:  Fair  Recall:  [id number] of Knowledge:Fair  Language: Fair  Akathisia:  No  Handed:  Right  AIMS (if indicated):     Assets:  Desire for Improvement Physical Health Resilience  Sleep:  Number of Hours: 6.5  Cognition: WNL  ADL's:  Intact   Mental Status Per Nursing Assessment::   On Admission:  NA  Demographic Factors:  Male, Caucasian, Low socioeconomic status, Living alone and Unemployed  Loss Factors: Financial problems/change in socioeconomic status  Historical Factors: Impulsivity  Risk Reduction Factors:   Living with another person, especially a relative  Continued Clinical Symptoms:  Bipolar Disorder:   Mixed State  Cognitive Features That Contribute To Risk:  None    Suicide Risk:  Minimal: No identifiable suicidal ideation.  Patients presenting with  no risk factors but with morbid ruminations; may be classified as minimal risk based on the severity of the depressive symptoms  Follow-up Information    [facility] Follow up.   Why: Please go to the walk in clinic on Monday-Friday at 830am. Please bring photo ID, hospital discharge paperwork, insurance card. Thank you. Contact information: [address] [place] [phone] Fax: [phone]          Plan Of Care/Follow-up recommendations:  Activity:  Activity as tolerated Diet:  Regular diet Other:  Follow-up with [facility] continue current medication.  Do not relapse into drug abuse.  [phone], MD 01/26/2020, 9:29 AM

## 2020-01-26 NOTE — Progress Notes (Signed)
Patient ID: [name], male   DOB: 01-20-1976, 44 y.o.   MRN: [id number]  Discharge Note:  Patient denies SI/HI/AVH at this time. Discharge instructions, AVS, prescriptions, medication supply, and transition record gone over with patient. Patient agrees to comply with medication management, follow-up visit, and outpatient therapy. Patient questions and concerns addressed and answered. Patient ambulatory off unit. Patient discharged to [facility] via [employer], transportation services.

## 2020-01-26 NOTE — Progress Notes (Signed)
Recreation Therapy Notes  INPATIENT RECREATION TR PLAN  Patient Details Name: [name] MRN: [id number] DOB: [birth year] Today's Date: 01/26/2020  Rec Therapy Plan Is patient appropriate for Therapeutic Recreation?: Yes Treatment times per week: at least 3 Estimated Length of Stay: 5-7 days TR Treatment/Interventions: Group participation (Comment)  Discharge Criteria Pt will be discharged from therapy if:: Discharged Treatment plan/goals/alternatives discussed and agreed upon by:: Patient/family  Discharge Summary Short term goals set: Patient will engage in interactions with peers and staff in pro-social manner at least 2x within 5 recreation therapy group sessions Short term goals met: Not met Progress toward goals comments: Groups attended Which groups?: Communication Reason goals not met: Patient spent most of his time in his room. Therapeutic equipment acquired: Discharge Box Reason patient discharged from therapy: Discharge from hospital Pt/family agrees with progress & goals achieved: Yes Date patient discharged from therapy: 01/26/20   Izekiel Flegel 01/26/2020, 12:24 PM

## 2020-01-26 NOTE — Progress Notes (Signed)
D- Patient alert and oriented. Patient presents in a pleasant mood on assessment stating that he slept good last night, other than "that Seroquel gave me crazy dreams". Patient denies any depression, however,m reported anxiety, stating that he's worried "about what they're going to do with me". Patient also denies SI, HI, AVH at this time. Patient had no stated goals for today.  A- Scheduled medications administered to patient, per MD orders. Support and encouragement provided.  Routine safety checks conducted every 15 minutes.  Patient informed to notify staff with problems or concerns.  R- No adverse drug reactions noted. Patient contracts for safety at this time. Patient compliant with medications and treatment plan. Patient receptive, calm, and cooperative. Patient interacts well with others on the unit.  Patient remains safe at this time.

## 2020-01-26 NOTE — BHH Group Notes (Signed)
Overcoming Obstacles  01/26/2020 1PM  Type of Therapy and Topic:  Group Therapy:  Overcoming Obstacles  Participation Level:  Did Not Attend    Description of Group:    In this group patients will be encouraged to explore what they see as obstacles to their own wellness and recovery. They will be guided to discuss their thoughts, feelings, and behaviors related to these obstacles. The group will process together ways to cope with barriers, with attention given to specific choices patients can make. Each patient will be challenged to identify changes they are motivated to make in order to overcome their obstacles. This group will be process-oriented, with patients participating in exploration of their own experiences as well as giving and receiving support and challenge from other group members.   Therapeutic Goals: 1. Patient will identify personal and current obstacles as they relate to admission. 2. Patient will identify barriers that currently interfere with their wellness or overcoming obstacles.  3. Patient will identify feelings, thought process and behaviors related to these barriers. 4. Patient will identify two changes they are willing to make to overcome these obstacles:      Summary of Patient Progress     Therapeutic Modalities:   Cognitive Behavioral Therapy Solution Focused Therapy Motivational Interviewing Relapse Prevention Therapy    Kirsten Mckone, MSW, LCSW 01/26/2020 2:31 PM  

## 2020-01-26 NOTE — Progress Notes (Signed)
Recreation Therapy Notes  Date: 01/26/2020  Time: 9:30 am  Location: Craft Room  Behavioral response: Appropriate   Intervention Topic: Communication   Discussion/Intervention:  Group content today was focused on communication. The group defined communication and ways to communicate with others. Individuals stated reason why communication is important and some reasons to communicate with others. Patients expressed if they thought they were good at communicating with others and ways they could improve their communication skills. The group identified important parts of communication and some experiences they have had in the past with communication. The group participated in the intervention "What is that?", where they had a chance to test out their communication skills and identify ways to improve their communication techniques.  Clinical Observations/Feedback:  Patient came to group and defined communication as talking to others. Individual was social with peers and staff while participating in the intervention.   Karlea Mckibbin LRT/CTRS         Laquilla Dault 01/26/2020 12:07 PM

## 2020-02-05 MED ORDER — QUETIAPINE FUMARATE 100 MG PO TABS
200.00 | ORAL_TABLET | ORAL | Status: DC
Start: 2020-02-07 — End: 2020-02-05

## 2020-02-05 MED ORDER — CLONAZEPAM 0.5 MG PO TABS
1.00 | ORAL_TABLET | ORAL | Status: DC
Start: 2020-02-07 — End: 2020-02-05

## 2020-02-05 MED ORDER — GENERIC EXTERNAL MEDICATION
Status: DC
Start: ? — End: 2020-02-05

## 2020-02-05 MED ORDER — NICOTINE 21 MG/24HR TD PT24
1.00 | MEDICATED_PATCH | TRANSDERMAL | Status: DC
Start: 2020-02-08 — End: 2020-02-05

## 2020-02-05 MED ORDER — QUETIAPINE FUMARATE 100 MG PO TABS
100.00 | ORAL_TABLET | ORAL | Status: DC
Start: 2020-02-08 — End: 2020-02-05

## 2020-02-07 MED ORDER — GENERIC EXTERNAL MEDICATION
Status: DC
Start: ? — End: 2020-02-07

## 2020-02-07 MED ORDER — ALUM & MAG HYDROXIDE-SIMETH 400-400-40 MG/5ML PO SUSP
15.00 | ORAL | Status: DC
Start: ? — End: 2020-02-07

## 2020-02-15 ENCOUNTER — Emergency Department
Admission: EM | Admit: 2020-02-15 | Discharge: 2020-02-18 | Disposition: A | Discharge status: Home / Self Care | Payer: Self-pay | Attending: Emergency Medicine | Admitting: Emergency Medicine

## 2020-02-15 ENCOUNTER — Other Ambulatory Visit: Payer: Self-pay

## 2020-02-15 DIAGNOSIS — Z79899 Other long term (current) drug therapy: Secondary | ICD-10-CM | POA: Insufficient documentation

## 2020-02-15 DIAGNOSIS — F3181 Bipolar II disorder: Secondary | ICD-10-CM | POA: Diagnosis present

## 2020-02-15 DIAGNOSIS — F1210 Cannabis abuse, uncomplicated: Secondary | ICD-10-CM | POA: Diagnosis present

## 2020-02-15 DIAGNOSIS — R45851 Suicidal ideations: Secondary | ICD-10-CM | POA: Insufficient documentation

## 2020-02-15 DIAGNOSIS — F319 Bipolar disorder, unspecified: Secondary | ICD-10-CM | POA: Insufficient documentation

## 2020-02-15 DIAGNOSIS — F14159 Cocaine abuse with cocaine-induced psychotic disorder, unspecified: Secondary | ICD-10-CM | POA: Insufficient documentation

## 2020-02-15 DIAGNOSIS — F15951 Other stimulant use, unspecified with stimulant-induced psychotic disorder with hallucinations: Secondary | ICD-10-CM | POA: Diagnosis present

## 2020-02-15 DIAGNOSIS — F1520 Other stimulant dependence, uncomplicated: Secondary | ICD-10-CM | POA: Insufficient documentation

## 2020-02-15 DIAGNOSIS — F1910 Other psychoactive substance abuse, uncomplicated: Secondary | ICD-10-CM | POA: Diagnosis present

## 2020-02-15 DIAGNOSIS — Z20822 Contact with and (suspected) exposure to covid-19: Secondary | ICD-10-CM | POA: Insufficient documentation

## 2020-02-15 DIAGNOSIS — F19929 Other psychoactive substance use, unspecified with intoxication, unspecified: Secondary | ICD-10-CM | POA: Diagnosis present

## 2020-02-15 DIAGNOSIS — S82202A Unspecified fracture of shaft of left tibia, initial encounter for closed fracture: Secondary | ICD-10-CM | POA: Diagnosis present

## 2020-02-15 DIAGNOSIS — F1994 Other psychoactive substance use, unspecified with psychoactive substance-induced mood disorder: Secondary | ICD-10-CM | POA: Diagnosis present

## 2020-02-15 DIAGNOSIS — B1920 Unspecified viral hepatitis C without hepatic coma: Secondary | ICD-10-CM | POA: Diagnosis present

## 2020-02-15 DIAGNOSIS — F17200 Nicotine dependence, unspecified, uncomplicated: Secondary | ICD-10-CM | POA: Diagnosis present

## 2020-02-15 DIAGNOSIS — F17210 Nicotine dependence, cigarettes, uncomplicated: Secondary | ICD-10-CM | POA: Insufficient documentation

## 2020-02-15 DIAGNOSIS — F1220 Cannabis dependence, uncomplicated: Secondary | ICD-10-CM | POA: Insufficient documentation

## 2020-02-15 DIAGNOSIS — F1510 Other stimulant abuse, uncomplicated: Secondary | ICD-10-CM | POA: Diagnosis present

## 2020-02-15 LAB — COMPREHENSIVE METABOLIC PANEL
ALT: 89 U/L — ABNORMAL HIGH (ref 0–44)
AST: 49 U/L — ABNORMAL HIGH (ref 15–41)
Albumin: 4.6 g/dL (ref 3.5–5.0)
Alkaline Phosphatase: 55 U/L (ref 38–126)
Anion gap: 8 (ref 5–15)
BUN: 12 mg/dL (ref 6–20)
CO2: 27 mmol/L (ref 22–32)
Calcium: 9.2 mg/dL (ref 8.9–10.3)
Chloride: 102 mmol/L (ref 98–111)
Creatinine, Ser: 0.98 mg/dL (ref 0.61–1.24)
GFR calc Af Amer: >60 mL/min (ref >60)
GFR calc non Af Amer: >60 mL/min (ref >60)
Glucose, Bld: 135 mg/dL — ABNORMAL HIGH (ref 70–99)
Potassium: 4.5 mmol/L (ref 3.5–5.1)
Sodium: 137 mmol/L (ref 135–145)
Total Bilirubin: 1.1 mg/dL (ref 0.3–1.2)
Total Protein: 7.9 g/dL (ref 6.5–8.1)

## 2020-02-15 LAB — URINE DRUG SCREEN, QUALITATIVE (ARMC ONLY)
Amphetamines, Ur Screen: POSITIVE — AB
Barbiturates, Ur Screen: NOT DETECTED
Benzodiazepine, Ur Scrn: NOT DETECTED
Cannabinoid 50 Ng, Ur ~~LOC~~: POSITIVE — AB
Cocaine Metabolite,Ur ~~LOC~~: NOT DETECTED
MDMA (Ecstasy)Ur Screen: NOT DETECTED
Methadone Scn, Ur: NOT DETECTED
Opiate, Ur Screen: NOT DETECTED
Phencyclidine (PCP) Ur S: NOT DETECTED
Tricyclic, Ur Screen: NOT DETECTED

## 2020-02-15 LAB — CBC
HCT: 45.4 % (ref 39.0–52.0)
Hemoglobin: 15.2 g/dL (ref 13.0–17.0)
MCH: 29.3 pg (ref 26.0–34.0)
MCHC: 33.5 g/dL (ref 30.0–36.0)
MCV: 87.6 fL (ref 80.0–100.0)
Platelets: 259 10*3/uL (ref 150–400)
RBC: 5.18 MIL/uL (ref 4.22–5.81)
RDW: 12 % (ref 11.5–15.5)
WBC: 17.9 10*3/uL — ABNORMAL HIGH (ref 4.0–10.5)
nRBC: 0 % (ref 0.0–0.2)

## 2020-02-15 LAB — ACETAMINOPHEN LEVEL: Acetaminophen (Tylenol), Serum: <10 ug/mL — ABNORMAL LOW (ref 10–30)

## 2020-02-15 LAB — SALICYLATE LEVEL: Salicylate Lvl: <7 mg/dL — ABNORMAL LOW (ref 7.0–30.0)

## 2020-02-15 LAB — LITHIUM LEVEL: Lithium Lvl: 0.63 mmol/L (ref 0.60–1.20)

## 2020-02-15 LAB — SARS CORONAVIRUS 2 BY RT PCR (HOSPITAL ORDER, PERFORMED IN ~~LOC~~ HOSPITAL LAB): SARS Coronavirus 2: NEGATIVE

## 2020-02-15 LAB — ETHANOL: Alcohol, Ethyl (B): <10 mg/dL (ref <10)

## 2020-02-15 NOTE — ED Notes (Addendum)
Pt transferred into ED BHU room 1   Patient assigned to appropriate care area. Patient oriented to unit/care area: Informed that, for their safety, care areas are designed for safety and monitored by security cameras at all times; Visiting hours and phone times explained to patient. Patient verbalizes understanding, and verbal contract for safety obtained.   Assessment completed  He denies pain  Sandwich tray provided

## 2020-02-15 NOTE — ED Notes (Signed)
Dinner provided.

## 2020-02-15 NOTE — ED Notes (Signed)
Report to receiving nurse, patient transferred to BHU.  

## 2020-02-15 NOTE — ED Notes (Signed)
Pt belongings:  1 pair of black sneakers 1 pair of black socks 1 light blue shirt 1 pair of jeans 1 lighter, toothbrush, and chapstick 1 pair of black shorts 1 brown belt

## 2020-02-15 NOTE — ED Notes (Signed)
Patient reports here for a psych eval feeling SI x 3 months now, states attempted to take his life a few times, reports took a bottle of lithium and he did not die. Patient with flat affect states hearing voices and voices tell him to kill himself. Presently calm and cooperative. Safety maintained. will monitor.

## 2020-02-15 NOTE — BH Assessment (Signed)
Assessment Note  [name] is an 44 y.o. male who presents to the ER due to taking an intentional overdose of his lithium with the plan of ending his life. Patient states he have felt depressed for some time and hasn't had his medications and know he needed them. He states his current stressor are; currently homeless, active drug use and lack of involvement with his outpatient treatment.  Patient reports of having SI and AV/H.  During the interview the patient was calm, cooperative and pleasant. He was able to provide appropriate answers to the questions. However, there were some questions he was unable to answer. He admits to the use of methamphetamine, cannabis and alcohol. He has no current involvement with the legal system and no history of violence or aggression.   Diagnosis: Bipolar Disorder  Past Medical History:  Past Medical History:  Diagnosis Date  . 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 Anxiety   . Bipolar disorder (HCC)   . Depression   . Drug abuse (HCC)   . Hepatitis C   . Hepatitis C antibody positive in blood 09/08/2017  . Marijuana abuse 09/07/2017  . Sinus congestion     Past Surgical History:  Procedure Laterality Date  . ANKLE SURGERY    . HERNIA REPAIR    . HERNIA REPAIR    . TENDON REPAIR    . TIBIA IM NAIL INSERTION Left 09/06/2017   Procedure: INTRAMEDULLARY (IM) NAIL TIBIAL LEFT;  Surgeon: Myrene Galas, MD;  Location: [facility] OR;  Service: Orthopedics;  Laterality: Left;    Family History:  Family History  Problem Relation Age of Onset  . Heart failure Mother     Social History:  reports that he has been smoking cigarettes. He has been smoking about 1.00 pack per day. He uses smokeless tobacco. He reports current alcohol use. He reports current drug use. Frequency: 3.00 times per week. Drugs: Marijuana and Methamphetamines.  Additional Social History:  Alcohol / Drug Use Pain Medications: See PTA Prescriptions: See PTA Over the Counter: See  PTA History of alcohol / drug use?: Yes Longest period of sobriety (when/how long): Unable to quantify Substance #1 Name of Substance 1: Cannabis Substance #2 Name of Substance 2: Alcohol Substance #3 Name of Substance 3: Meth  CIWA: CIWA-Ar BP: 104/77 Pulse Rate: 79 COWS:    Allergies:  Allergies  Allergen Reactions  . Tramadol Nausea And Vomiting    Home Medications: (Not in a hospital admission)   OB/GYN Status:  No LMP for male patient.  General Assessment Data Location of Assessment: [facility] ED TTS Assessment: In system Is this a Tele or Face-to-Face Assessment?: Face-to-Face Is this an Initial Assessment or a Re-assessment for this encounter?: Initial Assessment Language Other than English: No Living Arrangements: Homeless/Shelter What gender do you identify as?: Male Marital status: Single Pregnancy Status: No Living Arrangements: Alone Can pt return to current living arrangement?: No Admission Status: Voluntary Petitioner: ED Attending Is patient capable of signing voluntary admission?: No Referral Source: Self/Family/Friend Insurance type: None  Medical Screening Exam [facility] Walk-in ONLY) Medical Exam completed: Yes  Crisis Care Plan Living Arrangements: Alone Legal Guardian: Other:(Self) Name of Psychiatrist: Reports of none Name of Therapist: Reports of none  Education Status Is patient currently in school?: No  Risk to self with the past 6 months Suicidal Ideation: Yes-Currently Present Has patient been a risk to self within the past 6 months prior to admission? : Yes Suicidal Intent: Yes-Currently Present Has patient had any  suicidal intent within the past 6 months prior to admission? : Yes Is patient at risk for suicide?: Yes Suicidal Plan?: Yes-Currently Present Has patient had any suicidal plan within the past 6 months prior to admission? : Yes Specify Current Suicidal Plan: Overdose on his medications Access to Means: Yes Specify Access to  Suicidal Means: Medications What has been your use of drugs/alcohol within the last 12 months?: Alcohol, Cannabis & Meth Previous Attempts/Gestures: Yes How many times?: 2 Other Self Harm Risks: Active drug use Triggers for Past Attempts: Unknown Intentional Self Injurious Behavior: None Family Suicide History: Unknown Recent stressful life event(s): Conflict (Comment), Loss (Comment), Financial Problems, Other (Comment) Persecutory voices/beliefs?: No Depression: Yes Depression Symptoms: Insomnia, Tearfulness, Isolating, Fatigue, Guilt, Loss of interest in usual pleasures, Feeling worthless/self pity Substance abuse history and/or treatment for substance abuse?: Yes Suicide prevention information given to non-admitted patients: Not applicable  Risk to Others within the past 6 months Homicidal Ideation: No Does patient have any lifetime risk of violence toward others beyond the six months prior to admission? : No Thoughts of Harm to Others: No Current Homicidal Intent: No Current Homicidal Plan: No Access to Homicidal Means: No Identified Victim: Reports of none History of harm to others?: No Assessment of Violence: None Noted Violent Behavior Description: Reports of none Does patient have access to weapons?: No Criminal Charges Pending?: No Does patient have a court date: No Is patient on probation?: No  Psychosis Hallucinations: Auditory, With command Delusions: None noted  Mental Status Report Appearance/Hygiene: Unremarkable, In scrubs Eye Contact: Fair Motor Activity: Freedom of movement, Unremarkable Speech: Logical/coherent, Unremarkable Level of Consciousness: Alert Mood: Anxious, Depressed, Helpless, Sad, Pleasant Affect: Appropriate to circumstance, Depressed, Sad Anxiety Level: Minimal Thought Processes: Coherent, Relevant Judgement: Partial Orientation: Person, Place, Time, Situation, Appropriate for developmental age Obsessive Compulsive Thoughts/Behaviors:  Minimal  Cognitive Functioning Concentration: Decreased Memory: Recent Intact, Remote Intact Is patient IDD: No Insight: Fair Impulse Control: Fair Appetite: Good Have you had any weight changes? : No Change Sleep: No Change Total Hours of Sleep: 8 Vegetative Symptoms: None  ADLScreening [facility] Assessment Services) Patient's cognitive ability adequate to safely complete daily activities?: Yes Patient able to express need for assistance with ADLs?: Yes Independently performs ADLs?: Yes (appropriate for developmental age)  Prior Inpatient Therapy Prior Inpatient Therapy: Yes Prior Therapy Dates: 2020 & 2018 Prior Therapy Facilty/Provider(s): [facility] Reason for Treatment: Bipolar Disorder   Prior Outpatient Therapy Prior Outpatient Therapy: No Does patient have an ACCT team?: No Does patient have Intensive In-House Services?  : No Does patient have [facility] services? : No Does patient have P4CC services?: No  ADL Screening (condition at time of admission) Patient's cognitive ability adequate to safely complete daily activities?: Yes Is the patient deaf or have difficulty hearing?: No Does the patient have difficulty seeing, even when wearing glasses/contacts?: No Does the patient have difficulty concentrating, remembering, or making decisions?: No Patient able to express need for assistance with ADLs?: Yes Does the patient have difficulty dressing or bathing?: No Independently performs ADLs?: Yes (appropriate for developmental age) Does the patient have difficulty walking or climbing stairs?: No Weakness of Legs: None Weakness of Arms/Hands: None  Home Assistive Devices/Equipment Home Assistive Devices/Equipment: None  Therapy Consults (therapy consults require a physician order) PT Evaluation Needed: No OT Evalulation Needed: No SLP Evaluation Needed: No Abuse/Neglect Assessment (Assessment to be complete while patient is alone) Abuse/Neglect Assessment Can Be  Completed: Yes Physical Abuse: Denies Verbal Abuse: Denies Sexual Abuse:  Denies Exploitation of patient/patient's resources: Denies Self-Neglect: Denies Values / Beliefs Cultural Requests During Hospitalization: None Spiritual Requests During Hospitalization: None Consults Spiritual Care Consult Needed: No Transition of Care Team Consult Needed: No Advance Directives (For Healthcare) Does Patient Have a Medical Advance Directive?: No  Child/Adolescent Assessment Running Away Risk: Denies(Patient is an adult)  Disposition:  Disposition Initial Assessment Completed for this Encounter: Yes  On Site Evaluation by:   Reviewed with Physician:    Lilyan Gilford MS, LCAS, [facility], NCC Therapeutic Triage Specialist 02/15/2020 7:03 PM

## 2020-02-15 NOTE — ED Notes (Signed)
VOL/  PENDING  CONSULT 

## 2020-02-15 NOTE — ED Notes (Signed)
Patient swabbed for covid.

## 2020-02-15 NOTE — ED Triage Notes (Signed)
Pt here for hearing voices feels that people can hear his thoughts. Pt admits to having SI thoughts and took a bunch of pills yesterday but it did not kill him.
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
## 2020-02-16 MED ORDER — QUETIAPINE FUMARATE 200 MG PO TABS
400.0000 mg | ORAL_TABLET | Freq: Every day | ORAL | Status: DC
  Administered 2020-02-16: 400 mg via ORAL
  Filled 2020-02-16: qty 2

## 2020-02-16 NOTE — ED Notes (Signed)
Hourly rounding reveals patient sleeping in room. No complaints, stable, in no acute distress. Q15 minute rounds and monitoring via Security Cameras to continue. 

## 2020-02-16 NOTE — BH Assessment (Signed)
Writer spoke with the patient to complete an updated/reassessment. Patient states he is "lossing his mind" and his third eye is bothering him. While speaking, patient is hitting his self in the head and rocking back and forward. However, prior to writer entering his room, patient was laying calmly in his bed and didn't appear to be in distress until writer walked into the room. After leaving, patient continued to hit and rub his forehead and talking loudly. However, when writer was no longer directly in front of the room window, he observed the patient return to laying calmly in the bed and no longer talking.

## 2020-02-16 NOTE — ED Notes (Signed)
VOL/Pending Placement 

## 2020-02-16 NOTE — ED Notes (Signed)
PT  IS NOT  IVC  PT  VOL  PENDING  DISPOSITION

## 2020-02-16 NOTE — ED Notes (Signed)
Took shower.

## 2020-02-16 NOTE — ED Notes (Signed)
IVC/PENDING DISPOSITION  

## 2020-02-16 NOTE — ED Notes (Signed)
Hourly rounding reveals patient awake in room. No complaints, stable, in no acute distress. Q15 minute rounds and monitoring via Security Cameras to continue. 

## 2020-02-16 NOTE — ED Notes (Signed)
Report to include Situation, Background, Assessment, and Recommendations received from [facility]. Patient alert, warm and dry, in no acute distress. Patient refuses to answer questions related to SI, HI, AVH and pain. Patient made aware of Q15 minute rounds and security cameras for their safety. Patient instructed to come to me with needs or concerns.

## 2020-02-16 NOTE — ED Notes (Signed)
Refused shower. 

## 2020-02-16 NOTE — Consult Note (Signed)
I met with the patient and reviewed the chart along side feedback from nursing staff.  [name] assessed the patient at 10:00 last night and [name] attested that the patient can be observed overnight for stabilization and treatment and reassessed in the morning.  She noted that if the patient continues to be stable overnight he could be discharged.  As noted by [name] this admission was provoked by complaints that he was hearing voices and that people could listen to his thoughts.  He mentioned that he had overdosed on lithium.  He denied any substance use however urine drug screen showed amphetamines.  Overall he provides very little history today and will not provide details regarding his experience or behavior prior to this admission.  I last saw the patient on April 13 and at that point noted his irritable behavior and methamphetamine use.  At that time he had no obvious hallucinations or delusions.  He had been assessed by [name] the evening before and attested by [name] who recommended inpatient psychiatric care.  He was admitted to the inpatient unit and followed by day Dr. Weber Rosales until his discharge on 23 April. He was treated with Seroquel 450m po QHS, Lithium CR 6065mpo q12 hours, Hydroxyzine 5046m6hr prn. During this admission he was treated with Klonopin 1mg52m BID for anxiety, and Haldol IM injections prn for agitation and psychosis.  He is generally described as irritable during that hospitalization. [facility]  In this hospitalization he has been calm overnight.  He was calm before I entered the room showing no distress and no concerns.  When I came in the room he became quite animated and holding his head screamed to get these voices out of his head and that these voices are trying to control him.  He became more and more agitated and provided little additional detail.  He said that he was not having any thoughts of suicide at  that moment or of hurting anyone else.  After I left the room he became even more animated and walked around the unit screaming get these voices out of my had get rid of these voices. A few minutes later he moved back to his room lay down and again started to rest quietly.   He currently does not have more typical manic symptoms and compared to prior hospitalizations he is relatively calm.  I am concerned that there is some secondary gain driving the above behaviors.  As noted elsewhere apparently he was quite unhappy that [facility].  My reasoning is that this presentation is quite atypical since other than during my brief encounter he did not identify the enormity of impact of these voices and the problem quickly went away.  Nor was there evidence outside of this event that he had been responding to voices or impacted by them in other ways.  I would like to continue to observe him in a controlled environment further to better assess his psychiatric symptomatology and lethality and determine if inpatient or outpatient follow-up is optimal.  I restarted his quetiapine at the prior dose of 400 mg at night.   He carries the diagnosis of bipolar disorder however I believe most of his symptomatology today is as a result of cocaine induced psychosis or potentially malingering.
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
## 2020-02-16 NOTE — ED Notes (Signed)
Given meal tray.

## 2020-02-16 NOTE — ED Notes (Signed)
Given dinner tray

## 2020-02-16 NOTE — ED Notes (Signed)
Pt denies SI/HI/VH but endorses AH. Flat affect and irritable with assessment. Pt advised awaiting psychiatry reassessment this morning.

## 2020-02-17 MED ORDER — LITHIUM CARBONATE ER 450 MG PO TBCR
750.0000 mg | EXTENDED_RELEASE_TABLET | Freq: Two times a day (BID) | ORAL | Status: DC
  Administered 2020-02-18: 750 mg via ORAL
  Filled 2020-02-17: qty 1

## 2020-02-17 MED ORDER — QUETIAPINE FUMARATE 200 MG PO TABS: 400.0000 mg | ORAL_TABLET | Freq: Every day | ORAL | Status: DC

## 2020-02-17 MED ORDER — LORAZEPAM 1 MG PO TABS: 1.0000 mg | ORAL_TABLET | ORAL | Status: DC | PRN

## 2020-02-17 MED ORDER — LITHIUM CARBONATE ER 300 MG PO TBCR
600.0000 mg | EXTENDED_RELEASE_TABLET | Freq: Two times a day (BID) | ORAL | Status: DC
  Filled 2020-02-17: qty 2

## 2020-02-17 NOTE — ED Notes (Signed)
Hourly rounding reveals patient sleeping in room. No complaints, stable, in no acute distress. Q15 minute rounds and monitoring via Security Cameras to continue. 

## 2020-02-17 NOTE — ED Notes (Signed)
Pt heard yelling out loudly intermittently, but has been observed laying in bed in his room all day. No interaction with peers or staff. Redirected from hitting walls with palm of hand. Irritable when staff speaks to him.

## 2020-02-17 NOTE — ED Notes (Signed)
Pt refuses to answer assessment questions saying he does not want to be disturbed.

## 2020-02-17 NOTE — BH Assessment (Signed)
TTS completed reassessment. Pt was unable to answer questions and repetitively mumbled " you know why the fuck I am here I feel terrible". During attempt to assess statement Pt aggressively stated " I don't know what the fuck this is" and was observed hitting his head. Pt stated "Shit going on with my head" but was unable to answer any follow up questions due to aggressive behaviors and going mute.

## 2020-02-17 NOTE — ED Notes (Signed)
Pt refused vitals 

## 2020-02-17 NOTE — ED Provider Notes (Signed)
Emergency Medicine Observation Re-evaluation Note  Philemon Riedesel is a 44 y.o. male, seen on rounds today.  Pt initially presented to the ED for complaints of Medical Clearance Currently, the patient is resting.  Physical Exam  BP 95/75 (BP Location: Right Arm)   Pulse 77   Temp 97.6 F (36.4 C) (Oral)   Resp 16   Ht 1.854 m (6\' 1" )   Wt 79.4 kg   SpO2 99%   BMI 23.09 kg/m  Physical Exam  ED Course / MDM  EKG:    I have reviewed the labs performed to date as well as medications administered while in observation.  Recent changes in the last 24 hours include psychiatric evaluation. Plan  Current plan is for psychiatric disposition.  I reordered home meds including his lithium after verifying that his lithium level was within normal limits. Patient is not under full IVC at this time.   , MD 02/17/20 [phone]

## 2020-02-17 NOTE — Progress Notes (Signed)
Patient ID: [name], male   DOB: December 06, 1975, 44 y.o.   MRN: [id number] Patient assessed and reevaluated by this writer.  Prior to evaluation patient was observed to be resting peacefully.  Patient did wake upon entering the room, patient was immediately agitated and began striking his head with his hands.  Patient was exhibiting nonsensical speech and mumbling.  Patient unable to for much information in regards to assessment and remained verbally aggressive throughout the evaluation.  At this time we will continue to observe patient, and resume home medications.  Patient was recently discharge from our facility after a 10-day length of stay on January 26, 2020.  At that time he was discharged on lithium 600 mg and Seroquel 400 mg p.o. nightly.  His lithium level on admission to the emergency room was 0.63, which is not at goal will adjust medication accordingly.  UDS was positive for amphetamines and THC, suspect substance induced mood disorder.  Will reassess tomorrow with possible discharge.

## 2020-02-17 NOTE — ED Notes (Signed)
Pt refused lithium saying he will not take something that does not work

## 2020-02-18 DIAGNOSIS — F15951 Other stimulant use, unspecified with stimulant-induced psychotic disorder with hallucinations: Secondary | ICD-10-CM

## 2020-02-18 DIAGNOSIS — F1220 Cannabis dependence, uncomplicated: Secondary | ICD-10-CM

## 2020-02-18 DIAGNOSIS — F17200 Nicotine dependence, unspecified, uncomplicated: Secondary | ICD-10-CM

## 2020-02-18 DIAGNOSIS — F17209 Nicotine dependence, unspecified, with unspecified nicotine-induced disorders: Secondary | ICD-10-CM

## 2020-02-18 DIAGNOSIS — F319 Bipolar disorder, unspecified: Secondary | ICD-10-CM

## 2020-02-18 DIAGNOSIS — F1510 Other stimulant abuse, uncomplicated: Secondary | ICD-10-CM

## 2020-02-18 DIAGNOSIS — F1910 Other psychoactive substance abuse, uncomplicated: Secondary | ICD-10-CM

## 2020-02-18 NOTE — ED Notes (Addendum)
Pt slamming doors on the unit.  Security called to escort patient off the unit.

## 2020-02-18 NOTE — ED Notes (Signed)
Pt has taken a shower.  

## 2020-02-18 NOTE — ED Provider Notes (Signed)
Emergency Medicine Observation Re-evaluation Note  Brae Gartman is a 44 y.o. male, seen on rounds today.  Pt initially presented to the ED for complaints of Medical Clearance Currently, the patient is resting.  Intermittent episodes of yelling during the day.  Physical Exam  BP 95/75 (BP Location: Right Arm)   Pulse 77   Temp 97.6 F (36.4 C) (Oral)   Resp 16   Ht 1.854 m (6\' 1" )   Wt 79.4 kg   SpO2 99%   BMI 23.09 kg/m  Physical Exam  ED Course / MDM  EKG:    I have reviewed the labs performed to date as well as medications administered while in observation.  No significant changes over the last 24 hours. Plan  Current plan is for psych/social work disposition. Patient is not under full IVC at this time.   , MD 02/18/20 [phone]

## 2020-02-18 NOTE — ED Notes (Signed)
Pt discharged home.  Refused VS and signature.  All belongings returned to patient. Discharge instructions given to patient.

## 2020-02-18 NOTE — ED Notes (Signed)
Pt up to restroom.
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
## 2020-02-18 NOTE — ED Notes (Signed)
RN unable to obtain discharge signature.

## 2020-02-18 NOTE — Discharge Instructions (Addendum)
You are cleared by the psychiatric team for discharge home.  

## 2020-02-18 NOTE — ED Notes (Signed)
Meal tray provided.

## 2020-02-18 NOTE — ED Notes (Signed)
Pt not happy he is being discharged.  Pt getting dressed at this time. Pt refused VS.

## 2020-09-02 ENCOUNTER — Ambulatory Visit (INDEPENDENT_AMBULATORY_CARE_PROVIDER_SITE_OTHER): Payer: Self-pay

## 2020-09-02 ENCOUNTER — Ambulatory Visit
Admission: EM | Admit: 2020-09-02 | Discharge: 2020-09-02 | Disposition: A | Discharge status: Home / Self Care | Payer: Self-pay | Attending: Emergency Medicine | Admitting: Emergency Medicine

## 2020-09-02 ENCOUNTER — Other Ambulatory Visit: Payer: Self-pay

## 2020-09-02 ENCOUNTER — Encounter: Payer: Self-pay | Admitting: Emergency Medicine

## 2020-09-02 DIAGNOSIS — R200 Anesthesia of skin: Secondary | ICD-10-CM

## 2020-09-02 DIAGNOSIS — M7989 Other specified soft tissue disorders: Secondary | ICD-10-CM | POA: Insufficient documentation

## 2020-09-02 LAB — BASIC METABOLIC PANEL
Anion gap: 6 (ref 5–15)
BUN: 8 mg/dL (ref 6–20)
CO2: 27 mmol/L (ref 22–32)
Calcium: 9 mg/dL (ref 8.9–10.3)
Chloride: 104 mmol/L (ref 98–111)
Creatinine, Ser: 0.78 mg/dL (ref 0.61–1.24)
GFR, Estimated: >60 mL/min (ref >60)
Glucose, Bld: 109 mg/dL — ABNORMAL HIGH (ref 70–99)
Potassium: 4.1 mmol/L (ref 3.5–5.1)
Sodium: 137 mmol/L (ref 135–145)
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
## 2020-09-02 NOTE — Discharge Instructions (Addendum)
We could find no attributable cause for your hand swelling today during her visit.  X-rays did not reveal any new injuries.  It is uncertain whether or not this is being caused by your meth use or not.  I would recommend stopping and seeing if your symptoms improve.  If your symptoms do not improve I would follow-up with [facility].

## 2020-09-03 ENCOUNTER — Other Ambulatory Visit: Payer: Self-pay

## 2020-09-03 ENCOUNTER — Encounter: Payer: Self-pay | Admitting: Emergency Medicine

## 2020-09-03 ENCOUNTER — Emergency Department
Admission: EM | Admit: 2020-09-03 | Discharge: 2020-09-03 | Disposition: A | Discharge status: Home / Self Care | Payer: Self-pay | Attending: Emergency Medicine | Admitting: Emergency Medicine

## 2020-09-03 DIAGNOSIS — F1510 Other stimulant abuse, uncomplicated: Secondary | ICD-10-CM | POA: Insufficient documentation

## 2020-09-03 DIAGNOSIS — Z87891 Personal history of nicotine dependence: Secondary | ICD-10-CM | POA: Insufficient documentation

## 2020-09-03 DIAGNOSIS — R202 Paresthesia of skin: Secondary | ICD-10-CM | POA: Insufficient documentation

## 2020-09-03 LAB — CBC
HCT: 46.9 % (ref 39.0–52.0)
Hemoglobin: 15.9 g/dL (ref 13.0–17.0)
MCH: 30.1 pg (ref 26.0–34.0)
MCHC: 33.9 g/dL (ref 30.0–36.0)
MCV: 88.7 fL (ref 80.0–100.0)
Platelets: 238 10*3/uL (ref 150–400)
RBC: 5.29 MIL/uL (ref 4.22–5.81)
RDW: 12.6 % (ref 11.5–15.5)
WBC: 4.7 10*3/uL (ref 4.0–10.5)
nRBC: 0 % (ref 0.0–0.2)

## 2020-09-03 LAB — BASIC METABOLIC PANEL
Anion gap: 9 (ref 5–15)
BUN: 8 mg/dL (ref 6–20)
CO2: 26 mmol/L (ref 22–32)
Calcium: 9.2 mg/dL (ref 8.9–10.3)
Chloride: 103 mmol/L (ref 98–111)
Creatinine, Ser: 0.88 mg/dL (ref 0.61–1.24)
GFR, Estimated: >60 mL/min (ref >60)
Glucose, Bld: 116 mg/dL — ABNORMAL HIGH (ref 70–99)
Potassium: 4.1 mmol/L (ref 3.5–5.1)
Sodium: 138 mmol/L (ref 135–145)

## 2020-09-03 LAB — CHLAMYDIA/NGC RT PCR (ARMC ONLY)
Chlamydia Tr: NOT DETECTED
N gonorrhoeae: NOT DETECTED

## 2020-09-03 MED ORDER — DIPHENHYDRAMINE HCL 25 MG PO CAPS
50.0000 mg | ORAL_CAPSULE | Freq: Once | ORAL | Status: AC
  Administered 2020-09-03: 50 mg via ORAL
  Filled 2020-09-03: qty 2

## 2020-09-03 MED ORDER — IBUPROFEN 400 MG PO TABS
400.0000 mg | ORAL_TABLET | Freq: Once | ORAL | Status: AC
  Administered 2020-09-03: 400 mg via ORAL
  Filled 2020-09-03: qty 1

## 2020-09-03 NOTE — ED Triage Notes (Signed)
Pt to ED via POV, states "I think I had some bad dope or something", pt endorses smoking meth 3 days ago, reports since then has had bilateral hand swelling and numbness, reports since then has been unable to sit still. Pt seen at [facility] for same yesterday.
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
## 2020-09-20 ENCOUNTER — Emergency Department
Admission: EM | Admit: 2020-09-20 | Discharge: 2020-09-21 | Disposition: A | Discharge status: Home / Self Care | Payer: Self-pay | Attending: Emergency Medicine | Admitting: Emergency Medicine

## 2020-09-20 ENCOUNTER — Other Ambulatory Visit: Payer: Self-pay

## 2020-09-20 ENCOUNTER — Emergency Department: Payer: Self-pay

## 2020-09-20 DIAGNOSIS — F1490 Cocaine use, unspecified, uncomplicated: Secondary | ICD-10-CM | POA: Insufficient documentation

## 2020-09-20 DIAGNOSIS — F10129 Alcohol abuse with intoxication, unspecified: Secondary | ICD-10-CM | POA: Insufficient documentation

## 2020-09-20 DIAGNOSIS — F1310 Sedative, hypnotic or anxiolytic abuse, uncomplicated: Secondary | ICD-10-CM | POA: Insufficient documentation

## 2020-09-20 DIAGNOSIS — F1290 Cannabis use, unspecified, uncomplicated: Secondary | ICD-10-CM | POA: Insufficient documentation

## 2020-09-20 DIAGNOSIS — Z20822 Contact with and (suspected) exposure to covid-19: Secondary | ICD-10-CM | POA: Insufficient documentation

## 2020-09-20 DIAGNOSIS — Z87891 Personal history of nicotine dependence: Secondary | ICD-10-CM | POA: Insufficient documentation

## 2020-09-20 DIAGNOSIS — F1910 Other psychoactive substance abuse, uncomplicated: Secondary | ICD-10-CM

## 2020-09-20 DIAGNOSIS — T50901A Poisoning by unspecified drugs, medicaments and biological substances, accidental (unintentional), initial encounter: Secondary | ICD-10-CM | POA: Insufficient documentation

## 2020-09-20 DIAGNOSIS — F10920 Alcohol use, unspecified with intoxication, uncomplicated: Secondary | ICD-10-CM

## 2020-09-20 MED ORDER — SODIUM CHLORIDE 0.9 % IV BOLUS
1000.0000 mL | Freq: Once | INTRAVENOUS | Status: AC
  Administered 2020-09-21: 1000 mL via INTRAVENOUS
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
## 2020-09-21 LAB — RESP PANEL BY RT-PCR (FLU A&B, COVID) ARPGX2
Influenza A by PCR: NEGATIVE
Influenza B by PCR: NEGATIVE
SARS Coronavirus 2 by RT PCR: NEGATIVE

## 2020-09-21 LAB — CBC WITH DIFFERENTIAL/PLATELET
Abs Immature Granulocytes: 0.05 10*3/uL (ref 0.00–0.07)
Basophils Absolute: 0 10*3/uL (ref 0.0–0.1)
Basophils Relative: 0 %
Eosinophils Absolute: 0 10*3/uL (ref 0.0–0.5)
Eosinophils Relative: 0 %
HCT: 42 % (ref 39.0–52.0)
Hemoglobin: 14.3 g/dL (ref 13.0–17.0)
Immature Granulocytes: 1 %
Lymphocytes Relative: 13 %
Lymphs Abs: 1 10*3/uL (ref 0.7–4.0)
MCH: 30.1 pg (ref 26.0–34.0)
MCHC: 34 g/dL (ref 30.0–36.0)
MCV: 88.4 fL (ref 80.0–100.0)
Monocytes Absolute: 0.4 10*3/uL (ref 0.1–1.0)
Monocytes Relative: 6 %
Neutro Abs: 6.2 10*3/uL (ref 1.7–7.7)
Neutrophils Relative %: 80 %
Platelets: 209 10*3/uL (ref 150–400)
RBC: 4.75 MIL/uL (ref 4.22–5.81)
RDW: 11.9 % (ref 11.5–15.5)
WBC: 7.7 10*3/uL (ref 4.0–10.5)
nRBC: 0 % (ref 0.0–0.2)

## 2020-09-21 LAB — URINE DRUG SCREEN, QUALITATIVE (ARMC ONLY)
Amphetamines, Ur Screen: NOT DETECTED
Barbiturates, Ur Screen: NOT DETECTED
Benzodiazepine, Ur Scrn: POSITIVE — AB
Cannabinoid 50 Ng, Ur ~~LOC~~: POSITIVE — AB
Cocaine Metabolite,Ur ~~LOC~~: POSITIVE — AB
MDMA (Ecstasy)Ur Screen: NOT DETECTED
Methadone Scn, Ur: NOT DETECTED
Opiate, Ur Screen: NOT DETECTED
Phencyclidine (PCP) Ur S: NOT DETECTED
Tricyclic, Ur Screen: NOT DETECTED

## 2020-09-21 LAB — COMPREHENSIVE METABOLIC PANEL
ALT: 105 U/L — ABNORMAL HIGH (ref 0–44)
AST: 84 U/L — ABNORMAL HIGH (ref 15–41)
Albumin: 4.1 g/dL (ref 3.5–5.0)
Alkaline Phosphatase: 48 U/L (ref 38–126)
Anion gap: 12 (ref 5–15)
BUN: 11 mg/dL (ref 6–20)
CO2: 25 mmol/L (ref 22–32)
Calcium: 8.6 mg/dL — ABNORMAL LOW (ref 8.9–10.3)
Chloride: 101 mmol/L (ref 98–111)
Creatinine, Ser: 1 mg/dL (ref 0.61–1.24)
GFR, Estimated: >60 mL/min (ref >60)
Glucose, Bld: 132 mg/dL — ABNORMAL HIGH (ref 70–99)
Potassium: 3.6 mmol/L (ref 3.5–5.1)
Sodium: 138 mmol/L (ref 135–145)
Total Bilirubin: 0.7 mg/dL (ref 0.3–1.2)
Total Protein: 7.4 g/dL (ref 6.5–8.1)

## 2020-09-21 LAB — ETHANOL: Alcohol, Ethyl (B): 100 mg/dL — ABNORMAL HIGH (ref <10)

## 2020-09-21 LAB — LITHIUM LEVEL: Lithium Lvl: <0.06 mmol/L — ABNORMAL LOW (ref 0.60–1.20)

## 2020-09-21 LAB — ACETAMINOPHEN LEVEL: Acetaminophen (Tylenol), Serum: <10 ug/mL — ABNORMAL LOW (ref 10–30)

## 2020-09-21 LAB — SALICYLATE LEVEL: Salicylate Lvl: <7 mg/dL — ABNORMAL LOW (ref 7.0–30.0)

## 2020-09-21 MED ORDER — SODIUM CHLORIDE 0.9 % IV BOLUS
1000.0000 mL | Freq: Once | INTRAVENOUS | Status: AC
  Administered 2020-09-21: 01:00:00 1000 mL via INTRAVENOUS

## 2020-09-21 NOTE — ED Notes (Signed)
Pt resting comfortably at this time. NAD noted. Per night shift RN pt is waiting on ride at this time, pt states "I don't remember what happened". NAD noted. VSS. Call bell in reach. Pt given ginger ale per request.

## 2020-09-21 NOTE — ED Notes (Signed)
D/C and importance of not using illicit drugs discussed with pt, pt verbalized understanding. NAD noted on D/C. VSS. Friend here to p/up pt.

## 2020-09-21 NOTE — Discharge Instructions (Addendum)
Do not use illicit drugs.  Return to the ER for worsening symptoms, persistent vomiting, difficulty breathing or other concerns. 

## 2020-09-21 NOTE — ED Notes (Signed)
Patient unable to answer coherently, repetitive questioning and ripping off vitals, electrodes, etc.  Pulled his IV placed by EMS.

## 2020-09-21 NOTE — ED Notes (Addendum)
Patient's BP going lower.  Placed in Trendelenburg and second fluid bolus hung  Patient does open his eyes to voice

## 2020-09-21 NOTE — ED Notes (Signed)
Pt alert at this time, oriented to self and location, orientation to situation and time questionable. Pt given sandwich tray and ginger ale, pt repeated asking what happened. Herbert Seta, friend called and requested to give pt ride home. Pt ride on their way at this time.

## 2021-05-10 ENCOUNTER — Emergency Department
Admission: EM | Admit: 2021-05-10 | Discharge: 2021-05-10 | Disposition: A | Discharge status: Home / Self Care | Payer: Self-pay | Attending: Emergency Medicine | Admitting: Emergency Medicine

## 2021-05-10 ENCOUNTER — Other Ambulatory Visit: Payer: Self-pay

## 2021-05-10 ENCOUNTER — Encounter: Payer: Self-pay | Admitting: Emergency Medicine

## 2021-05-10 DIAGNOSIS — H9201 Otalgia, right ear: Secondary | ICD-10-CM

## 2021-05-10 DIAGNOSIS — Z87891 Personal history of nicotine dependence: Secondary | ICD-10-CM | POA: Insufficient documentation

## 2021-05-10 DIAGNOSIS — H60331 Swimmer's ear, right ear: Secondary | ICD-10-CM | POA: Insufficient documentation

## 2021-05-10 MED ORDER — LIDOCAINE HCL (PF) 1 % IJ SOLN
2.0000 mL | Freq: Once | INTRAMUSCULAR | Status: AC
  Administered 2021-05-10: 2 mL
  Filled 2021-05-10: qty 5

## 2021-05-10 MED ORDER — CIPROFLOXACIN-DEXAMETHASONE 0.3-0.1 % OT SUSP: 4.0000 [drp] | Freq: Two times a day (BID) | OTIC | 0 refills | Status: AC

## 2021-05-10 MED ORDER — CIPROFLOXACIN-DEXAMETHASONE 0.3-0.1 % OT SUSP
4.0000 [drp] | Freq: Once | OTIC | Status: AC
  Administered 2021-05-10: 4 [drp] via OTIC
  Filled 2021-05-10: qty 7.5

## 2021-05-10 NOTE — Discharge Instructions (Addendum)
1.  You may take Ibuprofen and/or Tylenol as needed for discomfort. 2.  Apply antibiotic eardrops 4 drops to right ear eyes daily x7 days. 3.  Return to the ER for worsening symptoms, persistent vomiting, difficulty breathing or other concerns
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
## 2021-06-29 ENCOUNTER — Other Ambulatory Visit: Payer: Self-pay

## 2021-06-29 ENCOUNTER — Emergency Department
Admission: EM | Admit: 2021-06-29 | Discharge: 2021-07-01 | Disposition: A | Discharge status: Home / Self Care | Payer: Self-pay | Attending: Emergency Medicine | Admitting: Emergency Medicine

## 2021-06-29 DIAGNOSIS — F1220 Cannabis dependence, uncomplicated: Secondary | ICD-10-CM | POA: Diagnosis present

## 2021-06-29 DIAGNOSIS — Z20822 Contact with and (suspected) exposure to covid-19: Secondary | ICD-10-CM | POA: Insufficient documentation

## 2021-06-29 DIAGNOSIS — F1510 Other stimulant abuse, uncomplicated: Secondary | ICD-10-CM | POA: Diagnosis present

## 2021-06-29 DIAGNOSIS — F3181 Bipolar II disorder: Secondary | ICD-10-CM | POA: Diagnosis present

## 2021-06-29 DIAGNOSIS — F19959 Other psychoactive substance use, unspecified with psychoactive substance-induced psychotic disorder, unspecified: Secondary | ICD-10-CM | POA: Insufficient documentation

## 2021-06-29 DIAGNOSIS — F319 Bipolar disorder, unspecified: Secondary | ICD-10-CM | POA: Diagnosis present

## 2021-06-29 DIAGNOSIS — F15951 Other stimulant use, unspecified with stimulant-induced psychotic disorder with hallucinations: Secondary | ICD-10-CM | POA: Diagnosis present

## 2021-06-29 DIAGNOSIS — F1910 Other psychoactive substance abuse, uncomplicated: Secondary | ICD-10-CM | POA: Diagnosis present

## 2021-06-29 DIAGNOSIS — Z79899 Other long term (current) drug therapy: Secondary | ICD-10-CM | POA: Insufficient documentation

## 2021-06-29 DIAGNOSIS — Y900 Blood alcohol level of less than 20 mg/100 ml: Secondary | ICD-10-CM | POA: Insufficient documentation

## 2021-06-29 DIAGNOSIS — Z87891 Personal history of nicotine dependence: Secondary | ICD-10-CM | POA: Insufficient documentation

## 2021-06-29 MED ORDER — MIDAZOLAM HCL 5 MG/5ML IJ SOLN
4.0000 mg | Freq: Once | INTRAMUSCULAR | Status: AC
  Administered 2021-06-29: 4 mg via INTRAMUSCULAR
  Filled 2021-06-29: qty 5

## 2021-06-29 NOTE — ED Notes (Signed)
Pt has not made way to room yet, will evaluate once he arrives

## 2021-06-29 NOTE — ED Notes (Signed)
Pt takes injection willingly, would only allow for IM in Deltoid

## 2021-06-29 NOTE — ED Notes (Signed)
Pt to room
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
## 2021-06-29 NOTE — ED Triage Notes (Addendum)
Pt here with bizarre behavior. Pt states he does not want to be here, and will not stay in triage room. Per pt's friend who is speaking for pt. Per friend pt is not remembering things, is tearing his "house up". Per friend pt is hearing voices. Pt moving constantly and will not speak for himself. Pt will not sit still will not allow staff to draw blood or dress out, will not comply with temperature.

## 2021-06-29 NOTE — ED Notes (Signed)
CANNOT GET DRESSED OUT, BLOOD WORK OR TEMPERATURE IN TRIAGE DUE TO BEHAVIOR.

## 2021-06-30 LAB — CBC
HCT: 39.1 % (ref 39.0–52.0)
Hemoglobin: 14.2 g/dL (ref 13.0–17.0)
MCH: 31.7 pg (ref 26.0–34.0)
MCHC: 36.3 g/dL — ABNORMAL HIGH (ref 30.0–36.0)
MCV: 87.3 fL (ref 80.0–100.0)
Platelets: 211 10*3/uL (ref 150–400)
RBC: 4.48 MIL/uL (ref 4.22–5.81)
RDW: 12.6 % (ref 11.5–15.5)
WBC: 11.5 10*3/uL — ABNORMAL HIGH (ref 4.0–10.5)
nRBC: 0 % (ref 0.0–0.2)

## 2021-06-30 LAB — URINE DRUG SCREEN, QUALITATIVE (ARMC ONLY)
Amphetamines, Ur Screen: POSITIVE — AB
Barbiturates, Ur Screen: NOT DETECTED
Benzodiazepine, Ur Scrn: POSITIVE — AB
Cannabinoid 50 Ng, Ur ~~LOC~~: POSITIVE — AB
Cocaine Metabolite,Ur ~~LOC~~: NOT DETECTED
MDMA (Ecstasy)Ur Screen: NOT DETECTED
Methadone Scn, Ur: NOT DETECTED
Opiate, Ur Screen: NOT DETECTED
Phencyclidine (PCP) Ur S: NOT DETECTED
Tricyclic, Ur Screen: NOT DETECTED

## 2021-06-30 LAB — COMPREHENSIVE METABOLIC PANEL
ALT: 61 U/L — ABNORMAL HIGH (ref 0–44)
AST: 56 U/L — ABNORMAL HIGH (ref 15–41)
Albumin: 4.1 g/dL (ref 3.5–5.0)
Alkaline Phosphatase: 67 U/L (ref 38–126)
Anion gap: 11 (ref 5–15)
BUN: 14 mg/dL (ref 6–20)
CO2: 24 mmol/L (ref 22–32)
Calcium: 8.9 mg/dL (ref 8.9–10.3)
Chloride: 99 mmol/L (ref 98–111)
Creatinine, Ser: 0.92 mg/dL (ref 0.61–1.24)
GFR, Estimated: >60 mL/min (ref >60)
Glucose, Bld: 91 mg/dL (ref 70–99)
Potassium: 3.7 mmol/L (ref 3.5–5.1)
Sodium: 134 mmol/L — ABNORMAL LOW (ref 135–145)
Total Bilirubin: 1.5 mg/dL — ABNORMAL HIGH (ref 0.3–1.2)
Total Protein: 7.1 g/dL (ref 6.5–8.1)

## 2021-06-30 LAB — SALICYLATE LEVEL: Salicylate Lvl: <7 mg/dL — ABNORMAL LOW (ref 7.0–30.0)

## 2021-06-30 LAB — ETHANOL: Alcohol, Ethyl (B): <10 mg/dL (ref <10)

## 2021-06-30 LAB — RESP PANEL BY RT-PCR (FLU A&B, COVID) ARPGX2
Influenza A by PCR: NEGATIVE
Influenza B by PCR: NEGATIVE
SARS Coronavirus 2 by RT PCR: NEGATIVE

## 2021-06-30 LAB — CK: Total CK: 213 U/L (ref 49–397)

## 2021-06-30 LAB — ACETAMINOPHEN LEVEL: Acetaminophen (Tylenol), Serum: <10 ug/mL — ABNORMAL LOW (ref 10–30)

## 2021-06-30 MED ORDER — DIAZEPAM 5 MG/ML IJ SOLN
5.0000 mg | Freq: Once | INTRAMUSCULAR | Status: AC
  Administered 2021-06-30: 5 mg via INTRAMUSCULAR
  Filled 2021-06-30: qty 2

## 2021-06-30 MED ORDER — QUETIAPINE FUMARATE 25 MG PO TABS
100.0000 mg | ORAL_TABLET | Freq: Two times a day (BID) | ORAL | Status: DC
  Administered 2021-06-30: 100 mg via ORAL
  Filled 2021-06-30 (×3): qty 4

## 2021-06-30 NOTE — ED Notes (Signed)
Gave patient lunch with juice. Patient is sleeping at this time. 

## 2021-06-30 NOTE — ED Notes (Signed)
Meal tray given to patient.

## 2021-06-30 NOTE — ED Notes (Addendum)
Refused vital signs, shower and general assessments.

## 2021-06-30 NOTE — ED Notes (Signed)
Pt. Was given snacks, sandwich tray and apple sauce, crackers, and a drink.

## 2021-06-30 NOTE — ED Notes (Signed)
Psych team in to evaluate patient

## 2021-06-30 NOTE — ED Notes (Signed)
Patient transferred from ED to [facility] room 3 after screening for contraband. Report received from Selena Batten, RN including Situation, Background, Assessment and Recommendations. Pt oriented to unit including Q15 minute rounds as well as the security cameras for their protection. Patient is alert and oriented, warm and dry in no acute distress. Patient refuses to answer questions of SI, HI, and AVH. Pt. Encouraged to let this nurse know if needs arise.

## 2021-06-30 NOTE — ED Notes (Signed)
Dr. York Cerise states no telepsych consult for patient due to condition and fact that it will be unsuccessful. Will wait for psych team in AM

## 2021-06-30 NOTE — ED Notes (Signed)
When pt arrived to room he was accompanied by friend, Herbert Seta, who assisted to get patient to room. She reports that he has been in this state for 3 days. States that he has poor memory, restlessness, unable to get thoughts together, states this has happened before. She reports she had to force patient to come to ED,Heater states there is a possibility that drugs have been used. States she knows he used adderall and xanax, history of heroin and meth use.   Pt is restless with uncontrolled movemetns, only mumbles, grunts and fidgets on assessment, unable to answer questions but is cooperative.   After med admin, pt was dressed out by this nurse and Tech, Aerial. IV started and COVID swab obtained.

## 2021-06-30 NOTE — ED Notes (Signed)
Hospital meal provided.  100% consumed, pt tolerated w/o complaints.  Waste discarded appropriately.   Requested patient provide urine sample.

## 2021-06-30 NOTE — ED Notes (Signed)
IVC/pending psych consult 

## 2021-06-30 NOTE — ED Notes (Signed)
IVC, pending consult 

## 2021-06-30 NOTE — Consult Note (Signed)
  Consult was attempted by this provider this morning. Patient remained with eyes closed and was continuing to twitch and move around, not answering questions coherently. He would only say "I don't know what to say. She made me come." Providers were unable to interview for psychiatric consult today due to patient's condition.

## 2021-07-01 ENCOUNTER — Other Ambulatory Visit: Payer: Self-pay | Admitting: Psychiatry

## 2021-07-01 DIAGNOSIS — F1910 Other psychoactive substance abuse, uncomplicated: Secondary | ICD-10-CM

## 2021-07-01 NOTE — ED Notes (Signed)
Lunch tray given. 

## 2021-07-01 NOTE — ED Notes (Signed)
Pt asleep at this time, unable to collect vitals. Will collect pt vitals once awake. 
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
## 2021-07-01 NOTE — ED Notes (Signed)
When pt was told the medication was Seroquel pt stated, "I don't want to take Seroquel."

## 2021-07-01 NOTE — ED Notes (Signed)
Pt refused VS  

## 2021-07-01 NOTE — ED Notes (Signed)
Breakfast tray placed in room ?

## 2021-07-01 NOTE — ED Notes (Signed)
Pt discharged to [facility]  Belongings sent with patient.

## 2021-07-01 NOTE — ED Notes (Signed)
Pt told he would be transported to [facility] in the next few minutes.  Pt grumbled at staff and remained under the covers.

## 2021-07-01 NOTE — Consult Note (Signed)
[facility] Face-to-Face Psychiatry Consult   Reason for Consult:  follow up behavioral evaluation Referring Physician:    Patient Identification: Rodrigus Kilker MRN:  [id number] Principal Diagnosis: Drug abuse (HCC) Diagnosis:  Principal Problem:   Drug abuse (HCC) Active Problems:   Bipolar 2 disorder, major depressive episode (HCC)   Cannabis use disorder, moderate, dependence (HCC)   Amphetamine abuse (HCC)   Amphetamine and psychostimulant-induced psychosis with hallucinations (HCC)   Bipolar 1 disorder (HCC)   Total Time spent with patient: 20 minutes  Subjective: "I don't want to hurt anybody. I don't want to hurt myself."   Follow up on Lanell Kaiyan Luczak is a 45 y.o. male patient admitted with "bizarre behavior.".  HPI:  Patient seen and chart reviewed. Patient remained somewhat irritable this morning, but was more amenable to speak after lunch. He is continuing to deny any suicidal ideations, homicidal ideations, auditory or visual hallucinations. Has not had any violent or disruptive behavior on the unit. Can be irritable with answering questions. There is no further indication for psychiatric hold. Dr. Toni Amend has released IVC. Patient requests assistance to get to [facility]. Notified Teresita, LCSW. Put in [facility] consult.   Past Psychiatric History: See previous  Risk to Self:   Risk to Others:   Prior Inpatient Therapy:   Prior Outpatient Therapy:    Past Medical History:  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   Anxiety    Bipolar disorder (HCC)    Depression    Drug abuse (HCC)    Hepatitis C    Hepatitis C antibody positive in blood 09/08/2017   Marijuana abuse 09/07/2017   Sinus congestion     Past Surgical History:  Procedure Laterality Date   ANKLE SURGERY     HERNIA REPAIR     HERNIA REPAIR     TENDON REPAIR     TIBIA IM NAIL INSERTION Left 09/06/2017   Procedure: INTRAMEDULLARY (IM) NAIL TIBIAL LEFT;  Surgeon:  Myrene Galas, MD;  Location: [facility] OR;  Service: Orthopedics;  Laterality: Left;   Family History:  Family History  Problem Relation Age of Onset   Heart failure Mother    Family Psychiatric  History: See previous Social History:  Social History   Substance and Sexual Activity  Alcohol Use Yes   Comment: reports weekend use (fri/sat night)     Social History   Substance and Sexual Activity  Drug Use Yes   Frequency: 3.0 times per week   Types: Marijuana, Methamphetamines   Comment: also uses Xanax not prescribed    Social History   Socioeconomic History   Marital status: Single    Spouse name: Not on file   Number of children: Not on file   Years of education: Not on file   Highest education level: Not on file  Occupational History   Not on file  Tobacco Use   Smoking status: Former    Packs/day: 1.00    Types: Cigarettes   Smokeless tobacco: Current  Substance and Sexual Activity   Alcohol use: Yes    Comment: reports weekend use (fri/sat night)   Drug use: Yes    Frequency: 3.0 times per week    Types: Marijuana, Methamphetamines    Comment: also uses Xanax not prescribed   Sexual activity: Not on file  Other Topics Concern   Not on file  Social History Narrative   ** Merged History Encounter **  Social Determinants of Health   Financial Resource Strain: Not on file  Food Insecurity: Not on file  Transportation Needs: Not on file  Physical Activity: Not on file  Stress: Not on file  Social Connections: Not on file   Additional Social History:    Allergies:   Allergies  Allergen Reactions   Tramadol Nausea And Vomiting    Labs:  Results for orders placed or performed during the hospital encounter of 06/29/21 (from the past 48 hour(s))  Comprehensive metabolic panel     Status: Abnormal   Collection Time: 06/29/21 11:57 PM  Result Value Ref Range   Sodium 134 (L) 135 - 145 mmol/L   Potassium 3.7 3.5 - 5.1 mmol/L   Chloride 99 98 - 111  mmol/L   CO2 24 22 - 32 mmol/L   Glucose, Bld 91 70 - 99 mg/dL    Comment: Glucose reference range applies only to samples taken after fasting for at least 8 hours.   BUN 14 6 - 20 mg/dL   Creatinine, Ser 4.69 0.61 - 1.24 mg/dL   Calcium 8.9 8.9 - 62.9 mg/dL   Total Protein 7.1 6.5 - 8.1 g/dL   Albumin 4.1 3.5 - 5.0 g/dL   AST 56 (H) 15 - 41 U/L   ALT 61 (H) 0 - 44 U/L   Alkaline Phosphatase 67 38 - 126 U/L   Total Bilirubin 1.5 (H) 0.3 - 1.2 mg/dL   GFR, Estimated >52 >84 mL/min    Comment: (NOTE) Calculated using the CKD-EPI Creatinine Equation (2021)    Anion gap 11 5 - 15    Comment: Performed at [facility], [address] [place], [place] [address]  Ethanol     Status: None   Collection Time: 06/29/21 11:57 PM  Result Value Ref Range   Alcohol, Ethyl (B) <10 <10 mg/dL    Comment: (NOTE) Lowest detectable limit for serum alcohol is 10 mg/dL.  For medical purposes only. Performed at [facility], [address] [place], [place] [address]   Salicylate level     Status: Abnormal   Collection Time: 06/29/21 11:57 PM  Result Value Ref Range   Salicylate Lvl <7.0 (L) 7.0 - 30.0 mg/dL    Comment: Performed at [facility], [address] [place], [place] [address]  Acetaminophen level     Status: Abnormal   Collection Time: 06/29/21 11:57 PM  Result Value Ref Range   Acetaminophen (Tylenol), Serum <10 (L) 10 - 30 ug/mL    Comment: (NOTE) Therapeutic concentrations vary significantly. A range of 10-30 ug/mL  may be an effective concentration for many patients. However, some  are best treated at concentrations outside of this range. Acetaminophen concentrations >150 ug/mL at 4 hours after ingestion  and >50 ug/mL at 12 hours after ingestion are often associated with  toxic reactions.  Performed at [facility], [address] [place], [place] [address]   cbc     Status: Abnormal   Collection Time: 06/29/21 11:57 PM  Result  Value Ref Range   WBC 11.5 (H) 4.0 - 10.5 K/uL   RBC 4.48 4.22 - 5.81 MIL/uL   Hemoglobin 14.2 13.0 - 17.0 g/dL   HCT 74.2 59.5 - 63.8 %   MCV 87.3 80.0 - 100.0 fL   MCH 31.7 26.0 - 34.0 pg   MCHC 36.3 (H) 30.0 - 36.0 g/dL   RDW 75.6 43.3 - 29.5 %   Platelets 211 150 - 400 K/uL  nRBC 0.0 0.0 - 0.2 %    Comment: Performed at [facility], [address] [place], [place] [address]  CK     Status: None   Collection Time: 06/29/21 11:57 PM  Result Value Ref Range   Total CK 213 49 - 397 U/L    Comment: Performed at [facility], [address] [place], [place] [address]  Resp Panel by RT-PCR (Flu A&B, Covid) Nasopharyngeal Swab     Status: None   Collection Time: 06/29/21 11:59 PM   Specimen: Nasopharyngeal Swab; Nasopharyngeal(NP) swabs in vial transport medium  Result Value Ref Range   SARS Coronavirus 2 by RT PCR NEGATIVE NEGATIVE    Comment: (NOTE) SARS-CoV-2 target nucleic acids are NOT DETECTED.  The SARS-CoV-2 RNA is generally detectable in upper respiratory specimens during the acute phase of infection. The lowest concentration of SARS-CoV-2 viral copies this assay can detect is 138 copies/mL. A negative result does not preclude SARS-Cov-2 infection and should not be used as the sole basis for treatment or other patient management decisions. A negative result may occur with  improper specimen collection/handling, submission of specimen other than nasopharyngeal swab, presence of viral mutation(s) within the areas targeted by this assay, and inadequate number of viral copies(<138 copies/mL). A negative result must be combined with clinical observations, patient history, and epidemiological information. The expected result is Negative.  Fact Sheet for Patients:  BloggerCourse.com  Fact Sheet for Healthcare Providers:  SeriousBroker.it  This test is no t yet approved or cleared by the [place]  FDA and  has been authorized for detection and/or diagnosis of SARS-CoV-2 by FDA under an Emergency Use Authorization (EUA). This EUA will remain  in effect (meaning this test can be used) for the duration of the COVID-19 declaration under Section 564(b)(1) of the Act, 21 U.S.C.section 360bbb-3(b)(1), unless the authorization is terminated  or revoked sooner.       Influenza A by PCR NEGATIVE NEGATIVE   Influenza B by PCR NEGATIVE NEGATIVE    Comment: (NOTE) The Xpert Xpress SARS-CoV-2/FLU/RSV plus assay is intended as an aid in the diagnosis of influenza from Nasopharyngeal swab specimens and should not be used as a sole basis for treatment. Nasal washings and aspirates are unacceptable for Xpert Xpress SARS-CoV-2/FLU/RSV testing.  Fact Sheet for Patients: BloggerCourse.com  Fact Sheet for Healthcare Providers: SeriousBroker.it  This test is not yet approved or cleared by the [place] FDA and has been authorized for detection and/or diagnosis of SARS-CoV-2 by FDA under an Emergency Use Authorization (EUA). This EUA will remain in effect (meaning this test can be used) for the duration of the COVID-19 declaration under Section 564(b)(1) of the Act, 21 U.S.C. section 360bbb-3(b)(1), unless the authorization is terminated or revoked.  Performed at [facility], [address] [place], [place] [address]   Urine Drug Screen, Qualitative     Status: Abnormal   Collection Time: 06/30/21 12:25 PM  Result Value Ref Range   Tricyclic, Ur Screen NONE DETECTED NONE DETECTED   Amphetamines, Ur Screen POSITIVE (A) NONE DETECTED   MDMA (Ecstasy)Ur Screen NONE DETECTED NONE DETECTED   Cocaine Metabolite,Ur [place] NONE DETECTED NONE DETECTED   Opiate, Ur Screen NONE DETECTED NONE DETECTED   Phencyclidine (PCP) Ur S NONE DETECTED NONE DETECTED   Cannabinoid 50 Ng, Ur [place] POSITIVE (A) NONE DETECTED   Barbiturates, Ur Screen NONE  DETECTED NONE DETECTED   Benzodiazepine, Ur Scrn POSITIVE (A) NONE DETECTED   Methadone Scn, Ur  NONE DETECTED NONE DETECTED    Comment: (NOTE) Tricyclics + metabolites, urine    Cutoff 1000 ng/mL Amphetamines + metabolites, urine  Cutoff 1000 ng/mL MDMA (Ecstasy), urine              Cutoff 500 ng/mL Cocaine Metabolite, urine          Cutoff 300 ng/mL Opiate + metabolites, urine        Cutoff 300 ng/mL Phencyclidine (PCP), urine         Cutoff 25 ng/mL Cannabinoid, urine                 Cutoff 50 ng/mL Barbiturates + metabolites, urine  Cutoff 200 ng/mL Benzodiazepine, urine              Cutoff 200 ng/mL Methadone, urine                   Cutoff 300 ng/mL  The urine drug screen provides only a preliminary, unconfirmed analytical test result and should not be used for non-medical purposes. Clinical consideration and professional judgment should be applied to any positive drug screen result due to possible interfering substances. A more specific alternate chemical method must be used in order to obtain a confirmed analytical result. Gas chromatography / mass spectrometry (GC/MS) is the preferred confirm atory method. Performed at [facility], [address] [place], [place] [address]     Current Facility-Administered Medications  Medication Dose Route Frequency Provider Last Rate Last Admin   QUEtiapine (SEROQUEL) tablet 100 mg  100 mg Oral BID Clapacs, Jackquline Denmark, MD   100 mg at 06/30/21 1211   Current Outpatient Medications  Medication Sig Dispense Refill   clotrimazole (LOTRIMIN) 1 % cream Apply topically 2 (two) times daily. To foot (Patient not taking: No sig reported) 14 g 0   lithium carbonate (LITHOBID) 300 MG CR tablet Take 2 tablets (600 mg total) by mouth every 12 (twelve) hours. (Patient not taking: No sig reported) 28 tablet 0   QUEtiapine (SEROQUEL) 200 MG tablet Take 4 tablets by mouth daily. (Patient not taking: No sig reported)     QUEtiapine (SEROQUEL) 400  MG tablet Take 1 tablet (400 mg total) by mouth at bedtime. (Patient not taking: No sig reported) 7 tablet 0   tolnaftate (TINACTIN) 1 % cream Apply topically 2 (two) times daily. (Patient not taking: No sig reported) 14 g 0    Musculoskeletal: Strength & Muscle Tone: within normal limits Gait & Station: normal Patient leans: N/A            Psychiatric Specialty Exam:  Presentation  General Appearance: Appropriate for Environment  Eye Contact:Fair  Speech:Normal Rate; Clear and Coherent  Speech Volume:Normal  Handedness:Right   Mood and Affect  Mood:Irritable  Affect:Blunt   Thought Process  Thought Processes:Coherent  Descriptions of Associations:Intact  Orientation:Full (Time, Place and Person)  Thought Content:Logical  History of Schizophrenia/Schizoaffective disorder:No  Duration of Psychotic Symptoms:Less than six months  Hallucinations:Hallucinations: None  Ideas of Reference:None  Suicidal Thoughts:Suicidal Thoughts: No  Homicidal Thoughts:Homicidal Thoughts: No   Sensorium  Memory:Immediate Good  Judgment:Fair  Insight:Fair   Executive Functions  Concentration:Fair  Attention Span:Fair  Recall:Fair  Fund of Knowledge:Fair  Language:Fair   Psychomotor Activity  Psychomotor Activity:Psychomotor Activity: Normal   Assets  Assets:Resilience   Sleep  Sleep:Sleep: Fair   Physical Exam: Physical Exam Vitals and nursing note reviewed.  HENT:     Head: Normocephalic.     Nose: No congestion  or rhinorrhea.  Eyes:     General:        Right eye: No discharge.        Left eye: No discharge.  Cardiovascular:     Rate and Rhythm: Normal rate.  Pulmonary:     Effort: Pulmonary effort is normal.  Musculoskeletal:        General: Normal range of motion.     Cervical back: Normal range of motion.  Skin:    General: Skin is dry.  Neurological:     Mental Status: He is alert and oriented to person, place, and time.   Psychiatric:        Attention and Perception: He is inattentive.        Mood and Affect: Affect is labile.        Speech: Speech normal.        Thought Content: Thought content normal.   Review of Systems  Psychiatric/Behavioral:  Positive for substance abuse.   All other systems reviewed and are negative. Blood pressure 106/75, pulse 66, temperature 97.9 F (36.6 C), temperature source Oral, resp. rate 16, height 6\' 2"  (1.88 m), weight 90.7 kg, SpO2 96 %. Body mass index is 25.68 kg/m.  Treatment Plan Summary: Plan 45 year old male with history of polysubstance abuse and bipolar disorder  who presented on 06/29/21 for bizarre behavior and possible auditory hallucinations.  UDS positive for  Amphetamines, benzodiazepines, cannibus. Patient was difficult to interview the first day. Today, is denying any suicidal or homicidal ideations, denies auditory or visual hallucinations. Patient is requesting discharge to [facility]. TOC consulted.  [facility] discussed with EDP.   Disposition: No evidence of imminent risk to self or others at present.   Patient does not meet criteria for psychiatric inpatient admission. Supportive therapy provided about ongoing stressors. Discussed crisis plan, support from social network, calling 911, coming to the Emergency Department, and calling Suicide Hotline.  Toni Amend, NP 07/01/2021 1:40 PM

## 2021-07-01 NOTE — ED Notes (Signed)
Transport here for pt.  Pt did not get dressed, but can discharge in blue scrubs.   Clothing and other belongings sent with patient.

## 2021-07-01 NOTE — ED Notes (Signed)
Dr. Toni Amend rescinded IVC papers  [phone]
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
## 2021-07-01 NOTE — Consult Note (Signed)
[facility] Face-to-Face Psychiatry Consult   Reason for Consult: behavioral evaluation Referring Physician:   Patient Identification: [name] MRN:  [id number] Principal Diagnosis: <principal problem not specified> Diagnosis:  Active Problems:   Cannabis use disorder, moderate, dependence (HCC)   Bipolar 2 disorder, major depressive episode (HCC)   Drug abuse (HCC)   Amphetamine abuse (HCC)   Amphetamine and psychostimulant-induced psychosis with hallucinations (HCC)   Bipolar 1 disorder (HCC)   Total Time spent with patient: 30 minutes  Subjective:   [name] is a 45 y.o. male patient presented to [facility] ED via POV under Involuntary Commitment status (IVC). patient with a history of polysubstance abuse and bipolar disorder and who has had issues with substance. The patient continues to exhibit some twitching and moving of his arms and legs, but not as severe as the previous night. The patient is not fully engaging in a conversation during his assessment. The patient is only able to answer with short answers. The patient was seen face-to-face by this provider; the chart was reviewed and consulted with Dr. Erma Heritage on 06/30/2021 due to the patient's care. It was discussed with the EDP that the patient would benefit from a Substance Use Disorder Inpatient Treatment Facility. On evaluation, the patient is alert and oriented x 3, calm, cooperative, and mood-congruent with affect. The patient is responding to external stimuli. The patient denies auditory and visual hallucinations. The patient denies any suicidal, homicidal, or self-harm ideations.   HPI: Per Dr. York Cerise, [name] is a 45 y.o. male with a history of polysubstance abuse and bipolar disorder and who has had issues with substance-induced mood disorder or psychosis in the past.  He presents voluntarily with a friend tonight for bizarre behavior, destructive behavior at home, auditory and visual hallucinations, and an extended  drug bandage over the last 3 to 4 days.  He does not provide any additional history.  He cannot remain still, constantly twitching and moving his arms and legs, not in a seizure-like manner, but in the matter of 1 who has been taking many stimulants.  His friend confirms that although he has used heroin in the past, most recently he seems to have been abusing Adderall, methamphetamine, and cocaine. The patient shrugs when asked if he is doing okay.  He declines to answer any questions.  He is not exhibiting any violent or aggressive behavior at this time.  Past Psychiatric History:  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  Anxiety   Bipolar disorder (HCC)   Depression   Drug abuse (HCC)   Marijuana abuse   Risk to Self:   Risk to Others:   Prior Inpatient Therapy:   Prior Outpatient Therapy:    Past Medical History:  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   Anxiety    Bipolar disorder (HCC)    Depression    Drug abuse (HCC)    Hepatitis C    Hepatitis C antibody positive in blood 09/08/2017   Marijuana abuse 09/07/2017   Sinus congestion     Past Surgical History:  Procedure Laterality Date   ANKLE SURGERY     HERNIA REPAIR     HERNIA REPAIR     TENDON REPAIR     TIBIA IM NAIL INSERTION Left 09/06/2017   Procedure: INTRAMEDULLARY (IM) NAIL TIBIAL LEFT;  Surgeon: Myrene Galas, MD;  Location: [facility] OR;  Service: Orthopedics;  Laterality: Left;   Family History:  Family History  Problem Relation Age  of Onset   Heart failure Mother    Family Psychiatric  History:  Social History:  Social History   Substance and Sexual Activity  Alcohol Use Yes   Comment: reports weekend use (fri/sat night)     Social History   Substance and Sexual Activity  Drug Use Yes   Frequency: 3.0 times per week   Types: Marijuana, Methamphetamines   Comment: also uses Xanax not prescribed    Social History   Socioeconomic History   Marital status: Single     Spouse name: Not on file   Number of children: Not on file   Years of education: Not on file   Highest education level: Not on file  Occupational History   Not on file  Tobacco Use   Smoking status: Former    Packs/day: 1.00    Types: Cigarettes   Smokeless tobacco: Current  Substance and Sexual Activity   Alcohol use: Yes    Comment: reports weekend use (fri/sat night)   Drug use: Yes    Frequency: 3.0 times per week    Types: Marijuana, Methamphetamines    Comment: also uses Xanax not prescribed   Sexual activity: Not on file  Other Topics Concern   Not on file  Social History Narrative   ** Merged History Encounter **       Social Determinants of Health   Financial Resource Strain: Not on file  Food Insecurity: Not on file  Transportation Needs: Not on file  Physical Activity: Not on file  Stress: Not on file  Social Connections: Not on file   Additional Social History:    Allergies:   Allergies  Allergen Reactions   Tramadol Nausea And Vomiting    Labs:  Results for orders placed or performed during the hospital encounter of 06/29/21 (from the past 48 hour(s))  Comprehensive metabolic panel     Status: Abnormal   Collection Time: 06/29/21 11:57 PM  Result Value Ref Range   Sodium 134 (L) 135 - 145 mmol/L   Potassium 3.7 3.5 - 5.1 mmol/L   Chloride 99 98 - 111 mmol/L   CO2 24 22 - 32 mmol/L   Glucose, Bld 91 70 - 99 mg/dL    Comment: Glucose reference range applies only to samples taken after fasting for at least 8 hours.   BUN 14 6 - 20 mg/dL   Creatinine, Ser 5.42 0.61 - 1.24 mg/dL   Calcium 8.9 8.9 - 70.6 mg/dL   Total Protein 7.1 6.5 - 8.1 g/dL   Albumin 4.1 3.5 - 5.0 g/dL   AST 56 (H) 15 - 41 U/L   ALT 61 (H) 0 - 44 U/L   Alkaline Phosphatase 67 38 - 126 U/L   Total Bilirubin 1.5 (H) 0.3 - 1.2 mg/dL   GFR, Estimated >23 >76 mL/min    Comment: (NOTE) Calculated using the CKD-EPI Creatinine Equation (2021)    Anion gap 11 5 - 15    Comment:  Performed at [facility], [address] [place], [place] [address]  Ethanol     Status: None   Collection Time: 06/29/21 11:57 PM  Result Value Ref Range   Alcohol, Ethyl (B) <10 <10 mg/dL    Comment: (NOTE) Lowest detectable limit for serum alcohol is 10 mg/dL.  For medical purposes only. Performed at [facility], [address] [place], [place] [address]   Salicylate level     Status: Abnormal   Collection Time: 06/29/21 11:57 PM  Result Value Ref Range   Salicylate Lvl <7.0 (L) 7.0 - 30.0 mg/dL    Comment: Performed at [facility], [address] [place], [place] [address]  Acetaminophen level     Status: Abnormal   Collection Time: 06/29/21 11:57 PM  Result Value Ref Range   Acetaminophen (Tylenol), Serum <10 (L) 10 - 30 ug/mL    Comment: (NOTE) Therapeutic concentrations vary significantly. A range of 10-30 ug/mL  may be an effective concentration for many patients. However, some  are best treated at concentrations outside of this range. Acetaminophen concentrations >150 ug/mL at 4 hours after ingestion  and >50 ug/mL at 12 hours after ingestion are often associated with  toxic reactions.  Performed at [facility], [address] [place], [place] [address]   cbc     Status: Abnormal   Collection Time: 06/29/21 11:57 PM  Result Value Ref Range   WBC 11.5 (H) 4.0 - 10.5 K/uL   RBC 4.48 4.22 - 5.81 MIL/uL   Hemoglobin 14.2 13.0 - 17.0 g/dL   HCT 61.6 07.3 - 71.0 %   MCV 87.3 80.0 - 100.0 fL   MCH 31.7 26.0 - 34.0 pg   MCHC 36.3 (H) 30.0 - 36.0 g/dL   RDW 62.6 94.8 - 54.6 %   Platelets 211 150 - 400 K/uL   nRBC 0.0 0.0 - 0.2 %    Comment: Performed at [facility], [address] [place], [place] [address]  CK     Status: None   Collection Time: 06/29/21 11:57 PM  Result Value Ref Range   Total CK 213 49 - 397 U/L    Comment: Performed at [facility], [address], [place], [place] [address]   Resp Panel by RT-PCR (Flu A&B, Covid) Nasopharyngeal Swab     Status: None   Collection Time: 06/29/21 11:59 PM   Specimen: Nasopharyngeal Swab; Nasopharyngeal(NP) swabs in vial transport medium  Result Value Ref Range   SARS Coronavirus 2 by RT PCR NEGATIVE NEGATIVE    Comment: (NOTE) SARS-CoV-2 target nucleic acids are NOT DETECTED.  The SARS-CoV-2 RNA is generally detectable in upper respiratory specimens during the acute phase of infection. The lowest concentration of SARS-CoV-2 viral copies this assay can detect is 138 copies/mL. A negative result does not preclude SARS-Cov-2 infection and should not be used as the sole basis for treatment or other patient management decisions. A negative result may occur with  improper specimen collection/handling, submission of specimen other than nasopharyngeal swab, presence of viral mutation(s) within the areas targeted by this assay, and inadequate number of viral copies(<138 copies/mL). A negative result must be combined with clinical observations, patient history, and epidemiological information. The expected result is Negative.  Fact Sheet for Patients:  BloggerCourse.com  Fact Sheet for Healthcare Providers:  SeriousBroker.it  This test is no t yet approved or cleared by the [place] FDA and  has been authorized for detection and/or diagnosis of SARS-CoV-2 by FDA under an Emergency Use Authorization (EUA). This EUA will remain  in effect (meaning this test can be used) for the duration of the COVID-19 declaration under Section 564(b)(1) of the Act, 21 U.S.C.section 360bbb-3(b)(1), unless the authorization is terminated  or revoked sooner.       Influenza A by PCR NEGATIVE NEGATIVE   Influenza B by PCR NEGATIVE NEGATIVE    Comment: (NOTE) The Xpert Xpress SARS-CoV-2/FLU/RSV plus assay is intended as an aid in the diagnosis of influenza from Nasopharyngeal  swab specimens  and should not be used as a sole basis for treatment. Nasal washings and aspirates are unacceptable for Xpert Xpress SARS-CoV-2/FLU/RSV testing.  Fact Sheet for Patients: BloggerCourse.com  Fact Sheet for Healthcare Providers: SeriousBroker.it  This test is not yet approved or cleared by the [place] FDA and has been authorized for detection and/or diagnosis of SARS-CoV-2 by FDA under an Emergency Use Authorization (EUA). This EUA will remain in effect (meaning this test can be used) for the duration of the COVID-19 declaration under Section 564(b)(1) of the Act, 21 U.S.C. section 360bbb-3(b)(1), unless the authorization is terminated or revoked.  Performed at [facility], [address] [place], [place] [address]   Urine Drug Screen, Qualitative     Status: Abnormal   Collection Time: 06/30/21 12:25 PM  Result Value Ref Range   Tricyclic, Ur Screen NONE DETECTED NONE DETECTED   Amphetamines, Ur Screen POSITIVE (A) NONE DETECTED   MDMA (Ecstasy)Ur Screen NONE DETECTED NONE DETECTED   Cocaine Metabolite,Ur [place] NONE DETECTED NONE DETECTED   Opiate, Ur Screen NONE DETECTED NONE DETECTED   Phencyclidine (PCP) Ur S NONE DETECTED NONE DETECTED   Cannabinoid 50 Ng, Ur [place] POSITIVE (A) NONE DETECTED   Barbiturates, Ur Screen NONE DETECTED NONE DETECTED   Benzodiazepine, Ur Scrn POSITIVE (A) NONE DETECTED   Methadone Scn, Ur NONE DETECTED NONE DETECTED    Comment: (NOTE) Tricyclics + metabolites, urine    Cutoff 1000 ng/mL Amphetamines + metabolites, urine  Cutoff 1000 ng/mL MDMA (Ecstasy), urine              Cutoff 500 ng/mL Cocaine Metabolite, urine          Cutoff 300 ng/mL Opiate + metabolites, urine        Cutoff 300 ng/mL Phencyclidine (PCP), urine         Cutoff 25 ng/mL Cannabinoid, urine                 Cutoff 50 ng/mL Barbiturates + metabolites, urine  Cutoff 200 ng/mL Benzodiazepine, urine               Cutoff 200 ng/mL Methadone, urine                   Cutoff 300 ng/mL  The urine drug screen provides only a preliminary, unconfirmed analytical test result and should not be used for non-medical purposes. Clinical consideration and professional judgment should be applied to any positive drug screen result due to possible interfering substances. A more specific alternate chemical method must be used in order to obtain a confirmed analytical result. Gas chromatography / mass spectrometry (GC/MS) is the preferred confirm atory method. Performed at [facility], [address] [place], [place] [address]     Current Facility-Administered Medications  Medication Dose Route Frequency Provider Last Rate Last Admin   QUEtiapine (SEROQUEL) tablet 100 mg  100 mg Oral BID Clapacs, Jackquline Denmark, MD   100 mg at 06/30/21 1211   Current Outpatient Medications  Medication Sig Dispense Refill   clotrimazole (LOTRIMIN) 1 % cream Apply topically 2 (two) times daily. To foot (Patient not taking: No sig reported) 14 g 0   lithium carbonate (LITHOBID) 300 MG CR tablet Take 2 tablets (600 mg total) by mouth every 12 (twelve) hours. (Patient not taking: No sig reported) 28 tablet 0   QUEtiapine (SEROQUEL) 200 MG tablet Take 4 tablets by mouth daily. (Patient not taking: No sig reported)  QUEtiapine (SEROQUEL) 400 MG tablet Take 1 tablet (400 mg total) by mouth at bedtime. (Patient not taking: No sig reported) 7 tablet 0   tolnaftate (TINACTIN) 1 % cream Apply topically 2 (two) times daily. (Patient not taking: No sig reported) 14 g 0    Musculoskeletal: Strength & Muscle Tone: within normal limits Gait & Station: normal Patient leans: N/A  Psychiatric Specialty Exam:  Presentation  General Appearance: Disheveled  Eye Contact:Minimal  Speech:Garbled  Speech Volume:Decreased  Handedness:Right   Mood and Affect  Mood:Labile  Affect:Flat   Thought Process  Thought  Processes:Linear  Descriptions of Associations:Loose  Orientation:Full (Time, Place and Person)  Thought Content:Logical  History of Schizophrenia/Schizoaffective disorder:No data recorded Duration of Psychotic Symptoms:No data recorded Hallucinations:Hallucinations: None  Ideas of Reference:None  Suicidal Thoughts:Suicidal Thoughts: No  Homicidal Thoughts:Homicidal Thoughts: No   Sensorium  Memory:Immediate Poor; Recent Poor; Remote Poor  Judgment:Poor  Insight:Lacking   Executive Functions  Concentration:Fair  Attention Span:Fair  Recall:Poor  Fund of Knowledge:Poor  Language:Fair   Psychomotor Activity  Psychomotor Activity:Psychomotor Activity: Normal   Assets  Assets:Communication Skills; Desire for Improvement; Resilience; Social Support   Sleep  Sleep:Sleep: Fair   Physical Exam: Physical Exam Vitals and nursing note reviewed.  Constitutional:      Appearance: He is normal weight.  HENT:     Head: Normocephalic and atraumatic.     Right Ear: External ear normal.     Left Ear: External ear normal.     Nose: Nose normal.     Mouth/Throat:     Mouth: Mucous membranes are dry.  Cardiovascular:     Rate and Rhythm: Normal rate.     Pulses: Normal pulses.  Pulmonary:     Effort: Pulmonary effort is normal.  Abdominal:     General: Abdomen is flat.  Musculoskeletal:        General: Normal range of motion.     Cervical back: Normal range of motion and neck supple.  Skin:    Findings: Rash present.  Neurological:     Mental Status: He is alert.     Deep Tendon Reflexes: Reflexes abnormal.   ROS Blood pressure 107/70, pulse 85, temperature 99.2 F (37.3 C), temperature source Oral, resp. rate 16, height 6\' 2"  (1.88 m), weight 90.7 kg, SpO2 95 %. Body mass index is 25.68 kg/m.  Treatment Plan Summary: Daily contact with patient to assess and evaluate symptoms and progress in treatment and Plan The patient would benefit from a Substance  Use Disorder Inpatient Treatment Facility  Disposition: Supportive therapy provided about ongoing stressors. Refer to IOP. The patient would benefit from a Substance Use Disorder Inpatient Treatment Facility  , NP 07/01/2021 1:54 AM

## 2021-07-01 NOTE — ED Notes (Signed)
IVC/psych consult complete/inpatient treatment facility recommended.

## 2022-02-04 ENCOUNTER — Other Ambulatory Visit: Payer: Self-pay

## 2022-02-04 ENCOUNTER — Emergency Department: Payer: Self-pay

## 2022-02-04 ENCOUNTER — Emergency Department
Admission: EM | Admit: 2022-02-04 | Discharge: 2022-02-05 | Disposition: A | Discharge status: Home / Self Care | Payer: Self-pay | Attending: Emergency Medicine | Admitting: Emergency Medicine

## 2022-02-04 DIAGNOSIS — Z720 Tobacco use: Secondary | ICD-10-CM | POA: Insufficient documentation

## 2022-02-04 DIAGNOSIS — R451 Restlessness and agitation: Secondary | ICD-10-CM | POA: Insufficient documentation

## 2022-02-04 DIAGNOSIS — M6282 Rhabdomyolysis: Secondary | ICD-10-CM | POA: Insufficient documentation

## 2022-02-04 DIAGNOSIS — R4182 Altered mental status, unspecified: Secondary | ICD-10-CM | POA: Insufficient documentation

## 2022-02-04 DIAGNOSIS — R748 Abnormal levels of other serum enzymes: Secondary | ICD-10-CM | POA: Insufficient documentation

## 2022-02-04 LAB — COMPREHENSIVE METABOLIC PANEL
ALT: 104 U/L — ABNORMAL HIGH (ref 0–44)
AST: 95 U/L — ABNORMAL HIGH (ref 15–41)
Albumin: 4.7 g/dL (ref 3.5–5.0)
Alkaline Phosphatase: 57 U/L (ref 38–126)
Anion gap: 11 (ref 5–15)
BUN: 24 mg/dL — ABNORMAL HIGH (ref 6–20)
CO2: 21 mmol/L — ABNORMAL LOW (ref 22–32)
Calcium: 9.8 mg/dL (ref 8.9–10.3)
Chloride: 106 mmol/L (ref 98–111)
Creatinine, Ser: 1.32 mg/dL — ABNORMAL HIGH (ref 0.61–1.24)
GFR, Estimated: >60 mL/min (ref >60)
Glucose, Bld: 72 mg/dL (ref 70–99)
Potassium: 4 mmol/L (ref 3.5–5.1)
Sodium: 138 mmol/L (ref 135–145)
Total Bilirubin: 0.9 mg/dL (ref 0.3–1.2)
Total Protein: 8 g/dL (ref 6.5–8.1)

## 2022-02-04 LAB — CBC WITH DIFFERENTIAL/PLATELET
Abs Immature Granulocytes: 0.04 10*3/uL (ref 0.00–0.07)
Basophils Absolute: 0.1 10*3/uL (ref 0.0–0.1)
Basophils Relative: 1 %
Eosinophils Absolute: 0 10*3/uL (ref 0.0–0.5)
Eosinophils Relative: 0 %
HCT: 46 % (ref 39.0–52.0)
Hemoglobin: 15.2 g/dL (ref 13.0–17.0)
Immature Granulocytes: 0 %
Lymphocytes Relative: 25 %
Lymphs Abs: 2.3 10*3/uL (ref 0.7–4.0)
MCH: 29.5 pg (ref 26.0–34.0)
MCHC: 33 g/dL (ref 30.0–36.0)
MCV: 89.1 fL (ref 80.0–100.0)
Monocytes Absolute: 0.7 10*3/uL (ref 0.1–1.0)
Monocytes Relative: 8 %
Neutro Abs: 6.1 10*3/uL (ref 1.7–7.7)
Neutrophils Relative %: 66 %
Platelets: 255 10*3/uL (ref 150–400)
RBC: 5.16 MIL/uL (ref 4.22–5.81)
RDW: 12.6 % (ref 11.5–15.5)
WBC: 9.2 10*3/uL (ref 4.0–10.5)
nRBC: 0 % (ref 0.0–0.2)

## 2022-02-04 LAB — BLOOD GAS, VENOUS
Acid-base deficit: 3.4 mmol/L — ABNORMAL HIGH (ref 0.0–2.0)
Bicarbonate: 20.3 mmol/L (ref 20.0–28.0)
O2 Saturation: 88.6 %
Patient temperature: 37
pCO2, Ven: 32 mmHg — ABNORMAL LOW (ref 44–60)
pH, Ven: 7.41 (ref 7.25–7.43)
pO2, Ven: 58 mmHg — ABNORMAL HIGH (ref 32–45)

## 2022-02-04 LAB — SALICYLATE LEVEL: Salicylate Lvl: <7 mg/dL — ABNORMAL LOW (ref 7.0–30.0)

## 2022-02-04 LAB — LACTIC ACID, PLASMA: Lactic Acid, Venous: 1.2 mmol/L (ref 0.5–1.9)

## 2022-02-04 LAB — ETHANOL: Alcohol, Ethyl (B): <10 mg/dL (ref <10)

## 2022-02-04 LAB — ACETAMINOPHEN LEVEL: Acetaminophen (Tylenol), Serum: <10 ug/mL — ABNORMAL LOW (ref 10–30)

## 2022-02-04 LAB — TROPONIN I (HIGH SENSITIVITY): Troponin I (High Sensitivity): 7 ng/L (ref <18)

## 2022-02-04 LAB — CK: Total CK: 1389 U/L — ABNORMAL HIGH (ref 49–397)

## 2022-02-04 MED ORDER — SODIUM CHLORIDE 0.9 % IV BOLUS
1000.0000 mL | Freq: Once | INTRAVENOUS | Status: AC
Start: 2022-02-04 — End: 2022-02-05
  Administered 2022-02-04: 1000 mL via INTRAVENOUS

## 2022-02-04 MED ORDER — LORAZEPAM 2 MG/ML IJ SOLN
INTRAMUSCULAR | Status: AC
  Administered 2022-02-04: 1 mg
  Filled 2022-02-04: qty 1

## 2022-02-04 MED ORDER — LORAZEPAM 2 MG/ML IJ SOLN
INTRAMUSCULAR | Status: AC
  Administered 2022-02-04: 1 mg via INTRAVENOUS
  Filled 2022-02-04: qty 1

## 2022-02-04 MED ORDER — SODIUM CHLORIDE 0.9 % IV BOLUS
1000.0000 mL | Freq: Once | INTRAVENOUS | Status: AC
  Administered 2022-02-04: 1000 mL via INTRAVENOUS

## 2022-02-04 NOTE — ED Notes (Signed)
Sitting 1:1 With pt at this time.  ?
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
## 2022-02-04 NOTE — ED Notes (Addendum)
Attempted to collect urine specimen with no success ?

## 2022-02-05 LAB — TROPONIN I (HIGH SENSITIVITY): Troponin I (High Sensitivity): 5 ng/L (ref <18)

## 2022-02-05 LAB — CK: Total CK: 1137 U/L — ABNORMAL HIGH (ref 49–397)

## 2022-02-05 NOTE — ED Notes (Signed)
Pt sleeping on his side at this time. Respirations even and unlabored. NAD noted ?

## 2022-02-05 NOTE — ED Provider Notes (Signed)
Patient was signed out to me pending repeat CK and metabolize.  Patient presented with altered mental status likely due to methamphetamine use.  His initial CK was above thousand.  He was given 2 L of fluid and on repeat it is not rising his down trended somewhat.  Do not feel that CK is high enough to warrant admission.  Patient required Ativan on arrival.  He is sleeping comfortably on my assessment awakens appropriately tells me that he took medication last night.  CT head was negative for any acute abnormality.  Overall I think he is medically cleared at this time.  Will need to ensure a ride home for safe discharge plan. ?  ?Rada Hay, MD ?02/05/22 [id number] ? ?

## 2022-02-24 ENCOUNTER — Other Ambulatory Visit: Payer: Self-pay

## 2022-02-24 ENCOUNTER — Emergency Department
Admission: EM | Admit: 2022-02-24 | Discharge: 2022-02-24 | Disposition: A | Discharge status: Home / Self Care | Payer: Self-pay | Attending: Emergency Medicine | Admitting: Emergency Medicine

## 2022-02-24 ENCOUNTER — Encounter: Payer: Self-pay | Admitting: Emergency Medicine

## 2022-02-24 DIAGNOSIS — F1910 Other psychoactive substance abuse, uncomplicated: Secondary | ICD-10-CM

## 2022-02-24 DIAGNOSIS — R110 Nausea: Secondary | ICD-10-CM | POA: Insufficient documentation

## 2022-02-24 DIAGNOSIS — F1510 Other stimulant abuse, uncomplicated: Secondary | ICD-10-CM | POA: Insufficient documentation

## 2022-02-24 DIAGNOSIS — Z760 Encounter for issue of repeat prescription: Secondary | ICD-10-CM | POA: Insufficient documentation

## 2022-02-24 DIAGNOSIS — F319 Bipolar disorder, unspecified: Secondary | ICD-10-CM | POA: Insufficient documentation

## 2022-02-24 DIAGNOSIS — F1110 Opioid abuse, uncomplicated: Secondary | ICD-10-CM | POA: Insufficient documentation

## 2022-02-24 MED ORDER — QUETIAPINE FUMARATE 400 MG PO TABS: 400.0000 mg | ORAL_TABLET | Freq: Every day | ORAL | 0 refills | Status: DC

## 2022-02-24 MED ORDER — ONDANSETRON HCL 4 MG/2ML IJ SOLN
4.0000 mg | Freq: Once | INTRAMUSCULAR | Status: DC
  Filled 2022-02-24: qty 2

## 2022-02-24 MED ORDER — CLONAZEPAM 0.5 MG PO TABS
0.5000 mg | ORAL_TABLET | Freq: Once | ORAL | Status: AC
  Administered 2022-02-24: 0.5 mg via ORAL
  Filled 2022-02-24: qty 1

## 2022-02-24 MED ORDER — ONDANSETRON 4 MG PO TBDP
4.0000 mg | ORAL_TABLET | Freq: Once | ORAL | Status: AC
  Administered 2022-02-24: 4 mg via ORAL
  Filled 2022-02-24: qty 1

## 2022-02-24 NOTE — ED Triage Notes (Signed)
Pt here for a refill on his Seroquel. Pt would also like a Klonopin refill if possible.

## 2022-02-24 NOTE — ED Provider Notes (Signed)
[facility] Provider Note    Event Date/Time   First MD Initiated Contact with Patient 02/24/22 1229     (approximate)   History   Medication Refill   HPI  [name] is a 46 y.o. male presents emergency department requesting a med refill of his Seroquel and possibly confidence.  Patient states he quit using heroin 2 days ago and meth.  Patient is a a known drug addict.  States he needs his Seroquel to help him get better.  He states he has some nausea at this time.  Denies SI/HI      Physical Exam   Triage Vital Signs: ED Triage Vitals  Enc Vitals Group     BP 02/24/22 1216 121/77     Pulse Rate 02/24/22 1216 77     Resp 02/24/22 1216 16     Temp 02/24/22 1216 97.9 F (36.6 C)     Temp Source 02/24/22 1216 Oral     SpO2 02/24/22 1216 98 %     Weight 02/24/22 1236 199 lb 15.3 oz (90.7 kg)     Height 02/24/22 1236 6\' 2"  (1.88 m)     Head Circumference --      Peak Flow --      Pain Score 02/24/22 1221 8     Pain Loc --      Pain Edu? --      Excl. in [place]? --     Most recent vital signs: Vitals:   02/24/22 1216  BP: 121/77  Pulse: 77  Resp: 16  Temp: 97.9 F (36.6 C)  SpO2: 98%     General: Awake, no distress.   CV:  Good peripheral perfusion. regular rate and  rhythm Resp:  Normal effort.  Abd:  No distention.   Other:      ED Results / Procedures / Treatments   Labs (all labs ordered are listed, but only abnormal results are displayed) Labs Reviewed - No data to display   EKG     RADIOLOGY     PROCEDURES:   Procedures   MEDICATIONS ORDERED IN ED: Medications  clonazePAM (KLONOPIN) tablet 0.5 mg (has no administration in time range)  ondansetron (ZOFRAN) injection 4 mg (has no administration in time range)     IMPRESSION / MDM / ASSESSMENT AND PLAN / ED COURSE  I reviewed the triage vital signs and the nursing notes.                              Differential diagnosis includes, but is not  limited to, drug abuse, heroin withdrawal, bipolar  Patient appears to be stable and is not suicidal or homicidal.  Gave him medication for nausea and anxiety while here in the ED.  Was given a prescription for Seroquel.  Follow-up with [facility].  Patient is in agreement treatment plan.  Discharged stable condition.        FINAL CLINICAL IMPRESSION(S) / ED DIAGNOSES   Final diagnoses:  Drug abuse (HCC)  Bipolar 1 disorder (HCC)     Rx / DC Orders   ED Discharge Orders          Ordered    QUEtiapine (SEROQUEL) 400 MG tablet  Daily at bedtime        02/24/22 1247             Note:  This document was prepared using Dragon voice recognition software and  may include unintentional dictation errors.    Faythe Ghee, PA-C 02/24/22 1543    Chesley Noon, MD 02/25/22 [phone]

## 2022-02-24 NOTE — ED Notes (Signed)
See triage note  states he would like to have Seroquel refilled  states he is a current drug user  last use was couple of days ago  feels like he is going thur withdrawals

## 2022-10-02 ENCOUNTER — Other Ambulatory Visit: Payer: Self-pay

## 2022-10-02 ENCOUNTER — Emergency Department
Admission: EM | Admit: 2022-10-02 | Discharge: 2022-10-02 | Disposition: A | Discharge status: Home / Self Care | Payer: Self-pay | Attending: Emergency Medicine | Admitting: Emergency Medicine

## 2022-10-02 DIAGNOSIS — Z760 Encounter for issue of repeat prescription: Secondary | ICD-10-CM | POA: Insufficient documentation

## 2022-10-02 MED ORDER — CLONAZEPAM 0.5 MG PO TABS
0.5000 mg | ORAL_TABLET | Freq: Once | ORAL | Status: AC
  Administered 2022-10-02: 0.5 mg via ORAL
  Filled 2022-10-02: qty 1

## 2022-10-02 MED ORDER — QUETIAPINE FUMARATE 400 MG PO TABS
400.0000 mg | ORAL_TABLET | Freq: Every day | ORAL | 0 refills | Status: DC
Start: 2022-10-02 — End: 2023-06-09

## 2022-10-02 NOTE — Discharge Instructions (Addendum)
-  Please call [facility] to schedule an appointment for future refills.  -Return to the emergency department anytime if you begin to experience any new or worsening symptoms.

## 2022-10-02 NOTE — ED Provider Notes (Signed)
[facility] Provider Note    None    (approximate)   History   Chief Complaint Medication Refill   HPI [name] is a 46 y.o. male, history of polysubstance abuse, bipolar 2, hepatitis C, presents to the emergency department for medication refill.  Patient states that he needs his Seroquel and his Klonopin refilled.  He states that he does not remember last time that he took either of these medications.  He is unsure what he takes his Seroquel for, but he does take Klonopin for anxiety.  Currently endorsing anxiety/panic at this time and is requesting a single dose of Klonopin.  No other symptoms at this time.  History Limitations: No limitations.        Physical Exam  Triage Vital Signs: ED Triage Vitals [10/02/22 1240]  Enc Vitals Group     BP      Pulse      Resp      Temp      Temp src      SpO2      Weight      Height      Head Circumference      Peak Flow      Pain Score 0     Pain Loc      Pain Edu?      Excl. in [place]?     Most recent vital signs: There were no vitals filed for this visit.  General: Awake, NAD.  Skin: Warm, dry. No rashes or lesions.  Eyes: PERRL. Conjunctivae normal.  CV: Good peripheral perfusion.  Resp: Normal effort.  Abd: Soft, non-tender. No distention.  Neuro: At baseline. No gross neurological deficits.  Musculoskeletal: Normal ROM of all extremities.   Physical Exam    ED Results / Procedures / Treatments  Labs (all labs ordered are listed, but only abnormal results are displayed) Labs Reviewed - No data to display   EKG N/A.    RADIOLOGY  ED Provider Interpretation: N/A.  No results found.  PROCEDURES:  Critical Care performed: N/A.  Procedures    MEDICATIONS ORDERED IN ED: Medications  clonazePAM (KLONOPIN) tablet 0.5 mg (0.5 mg Oral Given 10/02/22 1358)     IMPRESSION / MDM / ASSESSMENT AND PLAN / ED COURSE  I reviewed the triage vital signs and the nursing  notes.                              Differential diagnosis includes, but is not limited to, anxiety, depression, bipolar 2   Assessment/Plan Patient presents with request for medication refill of his Seroquel and Klonopin.  Upon record review, I do see that he does have established prescription for Seroquel for his bipolar disorder.  Will provide him with a refill of this.  However, I do not see any active prescriptions for clonazepam that has been prescribed for home use.  Will provide him with a single dose of clonazepam here today to help manage his symptoms, however advised him that if he would like this to be a continued medication that he takes consistently, he will need to follow-up with his primary care provider or with a psychiatric provider.  He states that he does not have one at this time.  Provide him with contact information for [facility].  Will discharge.  Provided the patient with anticipatory guidance, return precautions, and educational material. Encouraged the patient to return to  the emergency department at any time if they begin to experience any new or worsening symptoms. Patient expressed understanding and agreed with the plan.   Patient's presentation is most consistent with acute, uncomplicated illness.       FINAL CLINICAL IMPRESSION(S) / ED DIAGNOSES   Final diagnoses:  Medication refill     Rx / DC Orders   ED Discharge Orders          Ordered    QUEtiapine (SEROQUEL) 400 MG tablet  Daily at bedtime        10/02/22 1345             Note:  This document was prepared using Dragon voice recognition software and may include unintentional dictation errors.   Varney Daily, [place] 10/02/22 2016    Sharyn Creamer, MD 10/03/22 [phone]

## 2022-10-02 NOTE — ED Triage Notes (Addendum)
Pt comes with needing medication refill. Pt states he needs his Seroquel refilled. Pt also states he needs klonopin refilled as well.

## 2022-10-18 ENCOUNTER — Other Ambulatory Visit: Payer: Self-pay

## 2022-10-18 ENCOUNTER — Emergency Department
Admission: EM | Admit: 2022-10-18 | Discharge: 2022-10-18 | Disposition: A | Discharge status: Home / Self Care | Payer: Self-pay | Attending: Emergency Medicine | Admitting: Emergency Medicine

## 2022-10-18 DIAGNOSIS — M5416 Radiculopathy, lumbar region: Secondary | ICD-10-CM | POA: Insufficient documentation

## 2022-10-18 DIAGNOSIS — L02412 Cutaneous abscess of left axilla: Secondary | ICD-10-CM | POA: Insufficient documentation

## 2022-10-18 DIAGNOSIS — M5410 Radiculopathy, site unspecified: Secondary | ICD-10-CM

## 2022-10-18 DIAGNOSIS — L02411 Cutaneous abscess of right axilla: Secondary | ICD-10-CM | POA: Insufficient documentation

## 2022-10-18 LAB — COMPREHENSIVE METABOLIC PANEL
ALT: 73 U/L — ABNORMAL HIGH (ref 0–44)
AST: 80 U/L — ABNORMAL HIGH (ref 15–41)
Albumin: 4.2 g/dL (ref 3.5–5.0)
Alkaline Phosphatase: 67 U/L (ref 38–126)
Anion gap: 7 (ref 5–15)
BUN: 18 mg/dL (ref 6–20)
CO2: 27 mmol/L (ref 22–32)
Calcium: 8.5 mg/dL — ABNORMAL LOW (ref 8.9–10.3)
Chloride: 98 mmol/L (ref 98–111)
Creatinine, Ser: 0.77 mg/dL (ref 0.61–1.24)
GFR, Estimated: >60 mL/min (ref >60)
Glucose, Bld: 83 mg/dL (ref 70–99)
Potassium: 3.8 mmol/L (ref 3.5–5.1)
Sodium: 132 mmol/L — ABNORMAL LOW (ref 135–145)
Total Bilirubin: 1 mg/dL (ref 0.3–1.2)
Total Protein: 7.9 g/dL (ref 6.5–8.1)

## 2022-10-18 LAB — CBC WITH DIFFERENTIAL/PLATELET
Abs Immature Granulocytes: 0.02 10*3/uL (ref 0.00–0.07)
Basophils Absolute: 0.1 10*3/uL (ref 0.0–0.1)
Basophils Relative: 1 %
Eosinophils Absolute: 0.2 10*3/uL (ref 0.0–0.5)
Eosinophils Relative: 3 %
HCT: 44.7 % (ref 39.0–52.0)
Hemoglobin: 14.9 g/dL (ref 13.0–17.0)
Immature Granulocytes: 0 %
Lymphocytes Relative: 29 %
Lymphs Abs: 2.4 10*3/uL (ref 0.7–4.0)
MCH: 29.2 pg (ref 26.0–34.0)
MCHC: 33.3 g/dL (ref 30.0–36.0)
MCV: 87.5 fL (ref 80.0–100.0)
Monocytes Absolute: 0.8 10*3/uL (ref 0.1–1.0)
Monocytes Relative: 9 %
Neutro Abs: 4.8 10*3/uL (ref 1.7–7.7)
Neutrophils Relative %: 58 %
Platelets: 285 10*3/uL (ref 150–400)
RBC: 5.11 MIL/uL (ref 4.22–5.81)
RDW: 12.3 % (ref 11.5–15.5)
WBC: 8.3 10*3/uL (ref 4.0–10.5)
nRBC: 0 % (ref 0.0–0.2)

## 2022-10-18 MED ORDER — IBUPROFEN 600 MG PO TABS: 600.0000 mg | ORAL_TABLET | Freq: Three times a day (TID) | ORAL | 0 refills | Status: DC | PRN

## 2022-10-18 MED ORDER — CEPHALEXIN 500 MG PO CAPS: 500.0000 mg | ORAL_CAPSULE | Freq: Four times a day (QID) | ORAL | 0 refills | Status: DC

## 2022-10-18 NOTE — ED Provider Notes (Signed)
[facility] Provider Note    Event Date/Time   First MD Initiated Contact with Patient 10/18/22 1017     (approximate)   History   Abscess and Leg Pain   HPI  [name] is a 47 y.o. male   presents to the ED with complaint of bilateral boils bilateral axilla area for the last week.  Patient also has unrelated right lower extremity pain that he describes as a shooting discomfort starting at his lower back and going down his leg.  He denies any injury and continues to ambulate without any assistance.  Patient also reports that he has been doing some methamphetamines along with continue taking methadone.  He denies any fever or chills.  Patient has a history of hepatitis C, bipolar disorder, probably substance abuse.      Physical Exam   Triage Vital Signs: ED Triage Vitals  Enc Vitals Group     BP 10/18/22 1006 (!) 197/171     Pulse Rate 10/18/22 1002 85     Resp 10/18/22 1002 18     Temp 10/18/22 1002 97.6 F (36.4 C)     Temp Source 10/18/22 1002 Oral     SpO2 10/18/22 1002 96 %     Weight 10/18/22 1003 170 lb (77.1 kg)     Height 10/18/22 1003 6\' 1"  (1.854 m)     Head Circumference --      Peak Flow --      Pain Score 10/18/22 1003 7     Pain Loc --      Pain Edu? --      Excl. in [place]? --     Most recent vital signs: Vitals:   10/18/22 1006 10/18/22 1140  BP: (!) 197/171 102/66  Pulse:  78  Resp:  18  Temp:  (!) 97.5 F (36.4 C)  SpO2:  100%     General: Awake, no distress.  CV:  Good peripheral perfusion.  Resp:  Normal effort.  Abd:  No distention.  Other:  Bilateral axillary areas with multiple small erythematous papules without active drainage.  No area for consideration of an I&D.  No surrounding cellulitis bilaterally.  Patient is able to move upper extremities without any difficulty.  Tender right lower buttocks in the SI area and surrounding tissue.  Patient is able to move lower extremity without any difficulty or  restriction.  He is able to stand and ambulate without any assistance.   ED Results / Procedures / Treatments   Labs (all labs ordered are listed, but only abnormal results are displayed) Labs Reviewed  COMPREHENSIVE METABOLIC PANEL - Abnormal; Notable for the following components:      Result Value   Sodium 132 (*)    Calcium 8.5 (*)    AST 80 (*)    ALT 73 (*)    All other components within normal limits  CBC WITH DIFFERENTIAL/PLATELET      PROCEDURES:  Critical Care performed:   Procedures   MEDICATIONS ORDERED IN ED: Medications - No data to display   IMPRESSION / MDM / [place] / ED COURSE  I reviewed the triage vital signs and the nursing notes.   Differential diagnosis includes, but is not limited to, cutaneous abscess, hidradenitis, folliculitis, right lower extremity radiculopathy.  47 year old male presents to the ED with complaint of multiple abscesses bilateral axilla area.  Area has multiple erythematous papules but no drainable abscess present.  Patient also has symptoms and  findings consistent with a right lower extremity sciatica/radicular type pain.  With patient having history of bipolar disorder along with polysubstance abuse I will treat with ibuprofen for his radiculopathy and a prescription for Keflex 500 mg 4 times daily was sent to the pharmacy.  Patient is encouraged to use warm moist compresses to his arms and to follow-up with his PCP.  He also is eligible for the open-door clinic and a list of PCP clinics in the area was also printed for him to obtain a PCP if he does not already have 1.  Pressure initially in triage was elevated at 197/171 however prior to discharge recheck of his blood pressure was 102/66.      Patient's presentation is most consistent with acute complicated illness / injury requiring diagnostic workup.  FINAL CLINICAL IMPRESSION(S) / ED DIAGNOSES   Final diagnoses:  Radicular syndrome of right leg  Abscesses of  both axillae     Rx / DC Orders   ED Discharge Orders          Ordered    cephALEXin (KEFLEX) 500 MG capsule  4 times daily        10/18/22 1206    ibuprofen (ADVIL) 600 MG tablet  Every 8 hours PRN        10/18/22 1206             Note:  This document was prepared using Dragon voice recognition software and may include unintentional dictation errors.   Johnn Hai, PA-C 10/18/22 1328    Nance Pear, MD 10/18/22 [phone]

## 2022-10-18 NOTE — Discharge Instructions (Addendum)
Make an appointment with your primary care provider if you do not have a primary care provider the [facility]door clinic is available to you and also a list of primary care providers or also attached to your discharge papers.  The prescription for antibiotics was sent to the pharmacy along with ibuprofen for inflammation to your right lower extremity.  This should help with some of the radicular type pain that you are having.  You may also use warm moist heat to your lower back as needed for discomfort.  Use warm moist compresses under your arms to help with the small areas of infection and take the antibiotic until completely finished.

## 2022-10-18 NOTE — ED Notes (Signed)
Dr. Lamar Laundry notified of pt, and no new orders given.

## 2022-10-18 NOTE — ED Triage Notes (Signed)
Pt states coming in with bilateral abscess/boils under the armpits x 1 week and right leg pain, going on for over a year. Pt is very jittery in triage.  Pt also states taking "bad meth" but that he also takes methadone.  Pt not staying still while getting BP

## 2022-10-27 ENCOUNTER — Emergency Department: Admission: EM | Admit: 2022-10-27 | Discharge: 2022-10-27 | Discharge status: Against Medical Advice | Payer: Self-pay

## 2022-10-27 ENCOUNTER — Emergency Department: Payer: Self-pay

## 2022-10-27 ENCOUNTER — Other Ambulatory Visit: Payer: Self-pay

## 2022-10-27 ENCOUNTER — Emergency Department
Admission: EM | Admit: 2022-10-27 | Discharge: 2022-10-28 | Disposition: A | Discharge status: Home / Self Care | Payer: Self-pay | Attending: Emergency Medicine | Admitting: Emergency Medicine

## 2022-10-27 DIAGNOSIS — T402X1A Poisoning by other opioids, accidental (unintentional), initial encounter: Secondary | ICD-10-CM | POA: Insufficient documentation

## 2022-10-27 DIAGNOSIS — Y900 Blood alcohol level of less than 20 mg/100 ml: Secondary | ICD-10-CM | POA: Insufficient documentation

## 2022-10-27 DIAGNOSIS — R7401 Elevation of levels of liver transaminase levels: Secondary | ICD-10-CM | POA: Insufficient documentation

## 2022-10-27 DIAGNOSIS — F1510 Other stimulant abuse, uncomplicated: Secondary | ICD-10-CM | POA: Insufficient documentation

## 2022-10-27 DIAGNOSIS — T40601A Poisoning by unspecified narcotics, accidental (unintentional), initial encounter: Secondary | ICD-10-CM

## 2022-10-27 LAB — CBC WITH DIFFERENTIAL/PLATELET
Abs Immature Granulocytes: 0.01 10*3/uL (ref 0.00–0.07)
Basophils Absolute: 0 10*3/uL (ref 0.0–0.1)
Basophils Relative: 1 %
Eosinophils Absolute: 0.2 10*3/uL (ref 0.0–0.5)
Eosinophils Relative: 3 %
HCT: 41.2 % (ref 39.0–52.0)
Hemoglobin: 13.5 g/dL (ref 13.0–17.0)
Immature Granulocytes: 0 %
Lymphocytes Relative: 36 %
Lymphs Abs: 1.9 10*3/uL (ref 0.7–4.0)
MCH: 29 pg (ref 26.0–34.0)
MCHC: 32.8 g/dL (ref 30.0–36.0)
MCV: 88.4 fL (ref 80.0–100.0)
Monocytes Absolute: 0.6 10*3/uL (ref 0.1–1.0)
Monocytes Relative: 12 %
Neutro Abs: 2.6 10*3/uL (ref 1.7–7.7)
Neutrophils Relative %: 48 %
Platelets: 197 10*3/uL (ref 150–400)
RBC: 4.66 MIL/uL (ref 4.22–5.81)
RDW: 12.3 % (ref 11.5–15.5)
WBC: 5.3 10*3/uL (ref 4.0–10.5)
nRBC: 0 % (ref 0.0–0.2)

## 2022-10-27 LAB — LITHIUM LEVEL: Lithium Lvl: <0.06 mmol/L — ABNORMAL LOW (ref 0.60–1.20)

## 2022-10-27 LAB — COMPREHENSIVE METABOLIC PANEL
ALT: 73 U/L — ABNORMAL HIGH (ref 0–44)
AST: 83 U/L — ABNORMAL HIGH (ref 15–41)
Albumin: 3.7 g/dL (ref 3.5–5.0)
Alkaline Phosphatase: 57 U/L (ref 38–126)
Anion gap: 9 (ref 5–15)
BUN: 16 mg/dL (ref 6–20)
CO2: 26 mmol/L (ref 22–32)
Calcium: 8.1 mg/dL — ABNORMAL LOW (ref 8.9–10.3)
Chloride: 101 mmol/L (ref 98–111)
Creatinine, Ser: 1.01 mg/dL (ref 0.61–1.24)
GFR, Estimated: >60 mL/min (ref >60)
Glucose, Bld: 192 mg/dL — ABNORMAL HIGH (ref 70–99)
Potassium: 3.4 mmol/L — ABNORMAL LOW (ref 3.5–5.1)
Sodium: 136 mmol/L (ref 135–145)
Total Bilirubin: 0.4 mg/dL (ref 0.3–1.2)
Total Protein: 6.9 g/dL (ref 6.5–8.1)

## 2022-10-27 LAB — URINE DRUG SCREEN, QUALITATIVE (ARMC ONLY)
Amphetamines, Ur Screen: POSITIVE — AB
Barbiturates, Ur Screen: NOT DETECTED
Benzodiazepine, Ur Scrn: NOT DETECTED
Cannabinoid 50 Ng, Ur ~~LOC~~: NOT DETECTED
Cocaine Metabolite,Ur ~~LOC~~: NOT DETECTED
MDMA (Ecstasy)Ur Screen: NOT DETECTED
Methadone Scn, Ur: NOT DETECTED
Opiate, Ur Screen: NOT DETECTED
Phencyclidine (PCP) Ur S: NOT DETECTED
Tricyclic, Ur Screen: NOT DETECTED

## 2022-10-27 LAB — ETHANOL: Alcohol, Ethyl (B): <10 mg/dL (ref <10)

## 2022-10-27 MED ORDER — SODIUM CHLORIDE 0.9 % IV BOLUS (SEPSIS)
1000.0000 mL | Freq: Once | INTRAVENOUS | Status: AC
  Administered 2022-10-27: 1000 mL via INTRAVENOUS

## 2022-10-27 MED ORDER — ONDANSETRON HCL 4 MG/2ML IJ SOLN
INTRAMUSCULAR | Status: AC
Start: 2022-10-27 — End: 2022-10-27
  Administered 2022-10-27: 4 mg
  Filled 2022-10-27: qty 2

## 2022-10-27 MED ORDER — SODIUM CHLORIDE 0.9 % IV BOLUS
1000.0000 mL | Freq: Once | INTRAVENOUS | Status: AC
  Administered 2022-10-27: 1000 mL via INTRAVENOUS

## 2022-10-27 MED ORDER — SODIUM CHLORIDE 0.9 % IV SOLN: INTRAVENOUS | Status: DC

## 2022-10-27 NOTE — ED Notes (Signed)
Pt to ct with rn

## 2022-10-27 NOTE — ED Provider Notes (Signed)
[facility] Provider Note    Event Date/Time   First MD Initiated Contact with Patient 10/27/22 2041     (approximate)   History   Drug Overdose   HPI  [name] is a 47 y.o. male  here with suspected OD. Pt was reportedly seen outside his apartment when he lost consciousness, falling onto the ground. EMS was called. Pt was found minimally responsive and hypoxic, bradypneic. He was given narcan 4 mg with improvement in mental status and breathing. He remains confused but more awake. He is evasive when asked what he used. Does admit to using drugs but will not further elaborate. Denies any trauma.       Physical Exam   Triage Vital Signs: ED Triage Vitals [10/27/22 2040]  Enc Vitals Group     BP      Pulse      Resp      Temp      Temp src      SpO2      Weight      Height      Head Circumference      Peak Flow      Pain Score 0     Pain Loc      Pain Edu?      Excl. in [place]?     Most recent vital signs: Vitals:   10/27/22 2130 10/27/22 2300  BP:  (!) 86/52  Pulse: 67 66  Resp:  11  SpO2: (!) 83% 95%     General: Awake, no distress.  CV:  Good peripheral perfusion.  Resp:  Normal effort.  Abd:  No distention.  Other:  Pupils reactive bilaterally and not pinpoint. Frequent vomiting/spitting up noted. MAE. No apparent CN deficits.   ED Results / Procedures / Treatments   Labs (all labs ordered are listed, but only abnormal results are displayed) Labs Reviewed  COMPREHENSIVE METABOLIC PANEL - Abnormal; Notable for the following components:      Result Value   Potassium 3.4 (*)    Glucose, Bld 192 (*)    Calcium 8.1 (*)    AST 83 (*)    ALT 73 (*)    All other components within normal limits  URINE DRUG SCREEN, QUALITATIVE ([facility] ONLY) - Abnormal; Notable for the following components:   Amphetamines, Ur Screen POSITIVE (*)    All other components within normal limits  LITHIUM LEVEL - Abnormal; Notable for the following  components:   Lithium Lvl <0.06 (*)    All other components within normal limits  CBC WITH DIFFERENTIAL/PLATELET  ETHANOL     EKG Normal sinus rhythm, VR 57. PR 198, QRS 110, QTc 456. No acute st elevations or depressions. No ischemia or infarct.   RADIOLOGY    I also independently reviewed and agree with radiologist interpretations.   PROCEDURES:  Critical Care performed: No   MEDICATIONS ORDERED IN ED: Medications  ondansetron (ZOFRAN) 4 MG/2ML injection (4 mg  Given 10/27/22 2056)  sodium chloride 0.9 % bolus 1,000 mL (1,000 mLs Intravenous New Bag/Given 10/27/22 2111)     IMPRESSION / MDM / ASSESSMENT AND PLAN / ED COURSE  I reviewed the triage vital signs and the nursing notes.                              Differential diagnosis includes, but is not limited to, intoxication, substance abuse, intentional OD, unlikely head  trauma  Patient's presentation is most consistent with acute presentation with potential threat to life or bodily function.  The patient is on the cardiac monitor to evaluate for evidence of arrhythmia and/or significant heart rate changes.  47 yo M here with likely opiate overdose. Responded well to narcan but remains confused. H/o polysubstance abuse per records. CMP with mild transaminitis - suspect underlying hepatitis. EtOH negative. Amphetamines +, opiates negative - query fentanyl given his presentation.  Will monitor for sobriety and re-assess. CT Head/C Spine still pending.   FINAL CLINICAL IMPRESSION(S) / ED DIAGNOSES   Final diagnoses:  Opiate overdose, accidental or unintentional, initial encounter [facility])     Rx / DC Orders   ED Discharge Orders     None        Note:  This document was prepared using Dragon voice recognition software and may include unintentional dictation errors.   Duffy Bruce, MD 10/27/22 [phone]

## 2022-10-27 NOTE — ED Notes (Signed)
Report from ashley, rn.  

## 2022-10-27 NOTE — ED Notes (Signed)
Md notified of blood pressure, order for additional bolus received.

## 2022-10-27 NOTE — ED Triage Notes (Signed)
Pt arrived via [facility], with c/o overdose. Per EMS pt was found unresponsive per a Teacher, early years/pre.

## 2022-10-27 NOTE — ED Notes (Signed)
Pt arousable to verbal stimuli, pt tells staff to "fuck off" when awoken.

## 2022-10-27 NOTE — ED Notes (Signed)
This RN attempted to triage this patient in the presence of security and EDT Jonelle Sidle). When asked what is bringing the patient in he states " them mother fuckers". When asked to clarify the patient explains that he was sleeping at the [employer] and the police were called due to him loitering and instead of going out in the cold he came here. Pt denies any medical concerns and is wanting to to sleep in the lobby.

## 2022-10-27 NOTE — ED Provider Notes (Signed)
11:15 PM  Assumed care at shift change. Pt arrived to the ED after opiate OD requiring Narcan. CT head and cervical spine pending.  Will monitor until clinically sober.  1:10 AM  Pt's CT scan is reviewed and interpreted by myself and the radiologist and show no traumatic injury.  There is minimal groundglass infiltrate in the lung apices bilaterally.  Chest x-ray obtained which was also reviewed and interpreted by myself and the radiologist and is unremarkable.  No hypoxia here.  Will continue to monitor until clinically sober.  2:20 AM   Pt awake, alert, oriented.  He denies that this was an attempt to harm himself.  He has no acute complaints.  Blood pressure, oxygen have improved.  Eating and drinking currently.  States he is homeless and has nowhere to go and is requesting to be discharged in the morning.  Will give outpatient resources for substance abuse.   At this time, I do not feel there is any life-threatening condition present. I reviewed all nursing notes, vitals, pertinent previous records.  All lab and urine results, EKGs, imaging ordered have been independently reviewed and interpreted by myself.  I reviewed all available radiology reports from any imaging ordered this visit.  Based on my assessment, I feel the patient is safe to be discharged home without further emergent workup and can continue workup as an outpatient as needed. Discussed all findings, treatment plan as well as usual and customary return precautions.  They verbalize understanding and are comfortable with this plan.  Outpatient follow-up has been provided as needed.  All questions have been answered.    Violia Knopf, Delice Bison, DO 10/28/22 0222

## 2022-10-28 ENCOUNTER — Emergency Department: Payer: Self-pay

## 2022-10-28 ENCOUNTER — Other Ambulatory Visit: Payer: Self-pay

## 2022-10-28 ENCOUNTER — Encounter: Payer: Self-pay | Admitting: Emergency Medicine

## 2022-10-28 ENCOUNTER — Emergency Department
Admission: EM | Admit: 2022-10-28 | Discharge: 2022-10-28 | Disposition: A | Discharge status: Home / Self Care | Payer: Self-pay | Attending: Emergency Medicine | Admitting: Emergency Medicine

## 2022-10-28 DIAGNOSIS — M5431 Sciatica, right side: Secondary | ICD-10-CM | POA: Insufficient documentation

## 2022-10-28 DIAGNOSIS — M79605 Pain in left leg: Secondary | ICD-10-CM | POA: Insufficient documentation

## 2022-10-28 DIAGNOSIS — M79672 Pain in left foot: Secondary | ICD-10-CM | POA: Insufficient documentation

## 2022-10-28 MED ORDER — NALOXONE HCL 4 MG/0.1ML NA LIQD: NASAL | 2 refills | Status: AC

## 2022-10-28 NOTE — ED Notes (Signed)
Pt arouses to verbal stimuli, remains confused.

## 2022-10-28 NOTE — ED Notes (Addendum)
Pt ambualtory around room, food and po fluids provided. Pt alert and oriented at this time x4.

## 2022-10-28 NOTE — ED Provider Notes (Signed)
   [facility] Provider Note    Event Date/Time   First MD Initiated Contact with Patient 10/28/22 [phone]     (approximate)   History   Leg Pain   HPI  [name] is a 47 y.o. male  who checked into the emergency department today with concern for left foot pain. Patient states that he has broken that foot in the past and it has started to hurt again. The patient denies any specific injury to it recently. In addition he states he continues to have sciatica down his right leg.        Physical Exam   Triage Vital Signs: ED Triage Vitals  Enc Vitals Group     BP 10/28/22 0930 102/65     Pulse Rate 10/28/22 0930 82     Resp 10/28/22 0930 (!) 23     Temp --      Temp src --      SpO2 10/28/22 0930 90 %     Weight 10/28/22 0853 158 lb 11.7 oz (72 kg)     Height 10/28/22 0853 6\' 1"  (1.854 m)     Head Circumference --      Peak Flow --      Pain Score 10/28/22 0853 6     Pain Loc --      Pain Edu? --      Excl. in [place]? --     Most recent vital signs: Vitals:   10/28/22 0930 10/28/22 0940  BP: 102/65   Pulse: 82 73  Resp: (!) 23 18  SpO2: 90% 90%   General: Awake, alert, oriented. CV:  Good peripheral perfusion. Regular rate and rhythm. Resp:  Normal effort. Lungs clear. Abd:  No distention.  Other:  Left foot without any swelling or discoloration. DP 2+   ED Results / Procedures / Treatments   Labs (all labs ordered are listed, but only abnormal results are displayed) Labs Reviewed - No data to display   EKG  None   RADIOLOGY I independently interpreted and visualized the left foot. My interpretation: No acute fracture Radiology interpretation:  IMPRESSION:  No acute osseous abnormalities.    Prior LEFT tibial nailing.     PROCEDURES:  Critical Care performed: No  Procedures   MEDICATIONS ORDERED IN ED: Medications - No data to display   IMPRESSION / MDM / [place] / ED COURSE  I reviewed the triage  vital signs and the nursing notes.                              Differential diagnosis includes, but is not limited to, fracture, sprain  Patient's presentation is most consistent with acute presentation with potential threat to life or bodily function.  Patient presented to the emergency department today because of concern for left foot pain. X-ray without fracture or acute abnormality. Will plan on discharging with orthopedic follow up information.     FINAL CLINICAL IMPRESSION(S) / ED DIAGNOSES   Final diagnoses:  Left foot pain  Sciatica of right side     Note:  This document was prepared using Dragon voice recognition software and may include unintentional dictation errors.    Nance Pear, MD 10/28/22 [phone]

## 2022-10-28 NOTE — ED Notes (Signed)
Pt placed on oxygen at 2lpm via [place] for pox while sleeping in [place].

## 2022-10-28 NOTE — Discharge Instructions (Signed)
Please seek medical attention for any high fevers, chest pain, shortness of breath, change in behavior, persistent vomiting, bloody stool or any other new or concerning symptoms.  

## 2022-10-28 NOTE — ED Triage Notes (Addendum)
Pt checking in from the lobby. Pt was sleeping in the lobby, pt has been discharged this AM around 2:30am. Pt was told he needed to leave and that he could not sleep here from multiple staff members, pt then states that he wanted to check in for his L leg pain. Pt is ambulatory on arrival. Pt has been seen multiple times in the past couple of days. Pt is A&Ox4 and NAD

## 2022-10-28 NOTE — ED Notes (Signed)
Pt does not want to leave. Informed pt that he is up for discharge, VSS and has been seen by MD both last night and today.

## 2022-12-13 ENCOUNTER — Other Ambulatory Visit: Payer: Self-pay

## 2022-12-13 ENCOUNTER — Emergency Department
Admission: EM | Admit: 2022-12-13 | Discharge: 2022-12-13 | Disposition: A | Discharge status: Home / Self Care | Payer: Self-pay | Attending: Emergency Medicine | Admitting: Emergency Medicine

## 2022-12-13 DIAGNOSIS — Z008 Encounter for other general examination: Secondary | ICD-10-CM

## 2022-12-13 NOTE — ED Provider Notes (Signed)
   [facility] Provider Note    Event Date/Time   First MD Initiated Contact with Patient 12/13/22 1951     (approximate)   History   Medical Clearance   HPI  [name] is a 47 y.o. male with history of bipolar disorder, substance abuse brought in by police for medical clearance.  Patient states that he was having dry mouth and "the cops arrested him for nothing "so he demanded evaluation.     Physical Exam   Triage Vital Signs: ED Triage Vitals  Enc Vitals Group     BP 12/13/22 1952 (!) 142/97     Pulse Rate 12/13/22 1952 (!) 105     Resp 12/13/22 1952 20     Temp 12/13/22 1952 98 F (36.7 C)     Temp Source 12/13/22 1952 Oral     SpO2 12/13/22 1952 99 %     Weight 12/13/22 1953 72 kg (158 lb 11.7 oz)     Height 12/13/22 1953 1.854 m (6\' 1" )     Head Circumference --      Peak Flow --      Pain Score 12/13/22 2010 10     Pain Loc --      Pain Edu? --      Excl. in [place]? --     Most recent vital signs: Vitals:   12/13/22 1952  BP: (!) 142/97  Pulse: (!) 105  Resp: 20  Temp: 98 F (36.7 C)  SpO2: 99%     General: Awake, no distress.  Aggressive and rude CV:  Good peripheral perfusion.  Regular rate and rhythm Resp:  Normal effort.  Clear to auscultation bilaterally Abd:  No distention.  Other:  Lucid   ED Results / Procedures / Treatments   Labs (all labs ordered are listed, but only abnormal results are displayed) Labs Reviewed - No data to display   EKG     RADIOLOGY     PROCEDURES:  Critical Care performed:   Procedures   MEDICATIONS ORDERED IN ED: Medications - No data to display   IMPRESSION / MDM / [place] / ED COURSE  I reviewed the triage vital signs and the nursing notes. Patient's presentation is most consistent with acute, uncomplicated illness.  Patient overall well-appearing and in no acute distress, he appears quite frustrated that he was arrested today.  His exam is  overall reassuring, no indication for labs at this time, medically cleared for police.        FINAL CLINICAL IMPRESSION(S) / ED DIAGNOSES   Final diagnoses:  Medical clearance for incarceration     Rx / DC Orders   ED Discharge Orders     None        Note:  This document was prepared using Dragon voice recognition software and may include unintentional dictation errors.   Lavonia Drafts, MD 12/13/22 2138

## 2022-12-13 NOTE — ED Triage Notes (Signed)
Pt arrived via [facility] in custody after B&E arrest, pt c/o pain "all over" and doesn't know why he is being arrested. Dr. Corky Downs evaluated pt in triage with officers near by  Pt is in bilateral wrist cuffs.  Pt agitated in triage c/o mouth being dry, won't sit still.  COntinuously moving wrists causing irritation to wrists from hand cuffs.

## 2023-05-12 ENCOUNTER — Emergency Department: Payer: MEDICAID

## 2023-05-12 ENCOUNTER — Emergency Department
Admission: EM | Admit: 2023-05-12 | Discharge: 2023-05-12 | Disposition: A | Discharge status: Home / Self Care | Payer: MEDICAID | Attending: Emergency Medicine | Admitting: Emergency Medicine

## 2023-05-12 DIAGNOSIS — R000 Tachycardia, unspecified: Secondary | ICD-10-CM | POA: Diagnosis not present

## 2023-05-12 DIAGNOSIS — J208 Acute bronchitis due to other specified organisms: Secondary | ICD-10-CM | POA: Diagnosis not present

## 2023-05-12 DIAGNOSIS — R748 Abnormal levels of other serum enzymes: Secondary | ICD-10-CM | POA: Insufficient documentation

## 2023-05-12 DIAGNOSIS — Z79899 Other long term (current) drug therapy: Secondary | ICD-10-CM | POA: Diagnosis not present

## 2023-05-12 DIAGNOSIS — Z1152 Encounter for screening for COVID-19: Secondary | ICD-10-CM | POA: Diagnosis not present

## 2023-05-12 DIAGNOSIS — F1590 Other stimulant use, unspecified, uncomplicated: Secondary | ICD-10-CM | POA: Insufficient documentation

## 2023-05-12 DIAGNOSIS — R0981 Nasal congestion: Secondary | ICD-10-CM | POA: Diagnosis present

## 2023-05-12 LAB — CBC WITH DIFFERENTIAL/PLATELET
Abs Immature Granulocytes: 0.03 10*3/uL (ref 0.00–0.07)
Basophils Absolute: 0 10*3/uL (ref 0.0–0.1)
Basophils Relative: 0 %
Eosinophils Absolute: 0 10*3/uL (ref 0.0–0.5)
Eosinophils Relative: 0 %
HCT: 41.2 % (ref 39.0–52.0)
Hemoglobin: 13.7 g/dL (ref 13.0–17.0)
Immature Granulocytes: 0 %
Lymphocytes Relative: 14 %
Lymphs Abs: 1.5 10*3/uL (ref 0.7–4.0)
MCH: 28.7 pg (ref 26.0–34.0)
MCHC: 33.3 g/dL (ref 30.0–36.0)
MCV: 86.2 fL (ref 80.0–100.0)
Monocytes Absolute: 1.1 10*3/uL — ABNORMAL HIGH (ref 0.1–1.0)
Monocytes Relative: 10 %
Neutro Abs: 8.5 10*3/uL — ABNORMAL HIGH (ref 1.7–7.7)
Neutrophils Relative %: 76 %
Platelets: 194 10*3/uL (ref 150–400)
RBC: 4.78 MIL/uL (ref 4.22–5.81)
RDW: 12.6 % (ref 11.5–15.5)
WBC: 11.2 10*3/uL — ABNORMAL HIGH (ref 4.0–10.5)
nRBC: 0 % (ref 0.0–0.2)

## 2023-05-12 LAB — URINE DRUG SCREEN, QUALITATIVE (ARMC ONLY)
Amphetamines, Ur Screen: POSITIVE — AB
Barbiturates, Ur Screen: NOT DETECTED
Benzodiazepine, Ur Scrn: NOT DETECTED
Cannabinoid 50 Ng, Ur ~~LOC~~: POSITIVE — AB
Cocaine Metabolite,Ur ~~LOC~~: NOT DETECTED
MDMA (Ecstasy)Ur Screen: NOT DETECTED
Methadone Scn, Ur: NOT DETECTED
Opiate, Ur Screen: NOT DETECTED
Phencyclidine (PCP) Ur S: NOT DETECTED
Tricyclic, Ur Screen: NOT DETECTED

## 2023-05-12 LAB — COMPREHENSIVE METABOLIC PANEL
ALT: 62 U/L — ABNORMAL HIGH (ref 0–44)
AST: 45 U/L — ABNORMAL HIGH (ref 15–41)
Albumin: 4.1 g/dL (ref 3.5–5.0)
Alkaline Phosphatase: 56 U/L (ref 38–126)
Anion gap: 8 (ref 5–15)
BUN: 25 mg/dL — ABNORMAL HIGH (ref 6–20)
CO2: 22 mmol/L (ref 22–32)
Calcium: 8.5 mg/dL — ABNORMAL LOW (ref 8.9–10.3)
Chloride: 100 mmol/L (ref 98–111)
Creatinine, Ser: 0.95 mg/dL (ref 0.61–1.24)
GFR, Estimated: >60 mL/min (ref >60)
Glucose, Bld: 95 mg/dL (ref 70–99)
Potassium: 4.2 mmol/L (ref 3.5–5.1)
Sodium: 130 mmol/L — ABNORMAL LOW (ref 135–145)
Total Bilirubin: 0.6 mg/dL (ref 0.3–1.2)
Total Protein: 7.5 g/dL (ref 6.5–8.1)

## 2023-05-12 LAB — CK: Total CK: 399 U/L — ABNORMAL HIGH (ref 49–397)

## 2023-05-12 LAB — SARS CORONAVIRUS 2 BY RT PCR: SARS Coronavirus 2 by RT PCR: NEGATIVE

## 2023-05-12 LAB — ETHANOL: Alcohol, Ethyl (B): <10 mg/dL (ref <10)

## 2023-05-12 MED ORDER — LACTATED RINGERS IV BOLUS
1000.0000 mL | Freq: Once | INTRAVENOUS | Status: AC
  Administered 2023-05-12: 1000 mL via INTRAVENOUS

## 2023-05-12 MED ORDER — LORAZEPAM 2 MG/ML IJ SOLN
1.0000 mg | Freq: Once | INTRAMUSCULAR | Status: AC
  Administered 2023-05-12: 1 mg via INTRAVENOUS
  Filled 2023-05-12: qty 1

## 2023-05-12 NOTE — ED Provider Notes (Signed)
[facility] Provider Note    Event Date/Time   First MD Initiated Contact with Patient 05/12/23 1500     (approximate)   History   Chief Complaint Nasal Congestion (From court house, States he has been having muscle aches, nasal congestion and having green mucus. Patient aso states he uses meth and drinks a gallon of liquor a day. Last time he used was yesterday. ) and Drug Problem   HPI  [name] is a 47 y.o. male with past medical history of bipolar disorder, hepatitis C, and polysubstance abuse who presents to the ED complaining of congestion.  Patient reports that he has had about 3 days of subjective fevers, body aches, congestion, and cough corrective of greenish sputum.  He describes diffuse achy pain in all of his extremities, but denies any pain in his chest.  He does state that he has been feeling slightly short of breath, reports recent sick contact with similar symptoms.  He apparently presented to court today and was acting erratically, subsequently sent to the ED for further evaluation.  He denies any alcohol or drug use today.     Physical Exam   Triage Vital Signs: ED Triage Vitals  Encounter Vitals Group     BP 05/12/23 1502 (!) 121/90     Systolic BP Percentile --      Diastolic BP Percentile --      Pulse Rate 05/12/23 1459 (!) 102     Resp 05/12/23 1459 20     Temp 05/12/23 1459 98.2 F (36.8 C)     Temp Source 05/12/23 1459 Oral     SpO2 05/12/23 1459 98 %     Weight --      Height --      Head Circumference --      Peak Flow --      Pain Score 05/12/23 1458 10     Pain Loc --      Pain Education --      Exclude from Growth Chart --     Most recent vital signs: Vitals:   05/12/23 1459 05/12/23 1502  BP:  (!) 121/90  Pulse: (!) 102   Resp: 20   Temp: 98.2 F (36.8 C)   SpO2: 98%     Constitutional: Alert and oriented. Eyes: Conjunctivae are normal. Head: Atraumatic. Nose: No  congestion/rhinnorhea. Mouth/Throat: Mucous membranes are moist.  Cardiovascular: Tachycardic, regular rhythm. Grossly normal heart sounds.  2+ radial pulses bilaterally. Respiratory: Normal respiratory effort.  No retractions. Lungs CTAB. Gastrointestinal: Soft and nontender. No distention. Musculoskeletal: No lower extremity tenderness nor edema.  Neurologic: Hyperactive with continuous movement in all 4 extremities.  Normal speech and language. No gross focal neurologic deficits are appreciated.    ED Results / Procedures / Treatments   Labs (all labs ordered are listed, but only abnormal results are displayed) Labs Reviewed  CBC WITH DIFFERENTIAL/PLATELET - Abnormal; Notable for the following components:      Result Value   WBC 11.2 (*)    Neutro Abs 8.5 (*)    Monocytes Absolute 1.1 (*)    All other components within normal limits  COMPREHENSIVE METABOLIC PANEL - Abnormal; Notable for the following components:   Sodium 130 (*)    BUN 25 (*)    Calcium 8.5 (*)    AST 45 (*)    ALT 62 (*)    All other components within normal limits  URINE DRUG SCREEN, QUALITATIVE ([facility] ONLY) -  Abnormal; Notable for the following components:   Amphetamines, Ur Screen POSITIVE (*)    Cannabinoid 50 Ng, Ur [place] POSITIVE (*)    All other components within normal limits  CK - Abnormal; Notable for the following components:   Total CK 399 (*)    All other components within normal limits  SARS CORONAVIRUS 2 BY RT PCR  ETHANOL     EKG  ED ECG REPORT I, Chesley Noon, the attending physician, personally viewed and interpreted this ECG.   Date: 05/12/2023  EKG Time: 15:47  Rate: 126  Rhythm: sinus tachycardia  Axis: Normal  Intervals:none  ST&T Change: None  RADIOLOGY Chest x-ray reviewed and interpreted by me with no infiltrate, edema, or effusion.  PROCEDURES:  Critical Care performed: No  Procedures   MEDICATIONS ORDERED IN ED: Medications  LORazepam (ATIVAN) injection 1  mg (1 mg Intravenous Given 05/12/23 1526)  lactated ringers bolus 1,000 mL (1,000 mLs Intravenous Bolus from Bag 05/12/23 1526)     IMPRESSION / MDM / ASSESSMENT AND PLAN / ED COURSE  I reviewed the triage vital signs and the nursing notes.                              47 y.o. male with a past medical history of bipolar disorder, polysubstance abuse, and hepatitis C who presents to the ED complaining of 3 days of cough, congestion, body aches, and malaise.  Patient's presentation is most consistent with acute presentation with potential threat to life or bodily function.  Differential diagnosis includes, but is not limited to, COVID-19, other viral syndrome, pneumonia, AKI, anemia, lecture abnormality, rhabdomyolysis, substance abuse, psychosis.  Patient nontoxic-appearing and in no acute distress, vital signs remarkable for tachycardia.  He is quite hyperactive, constantly moving around on the stretcher, and I am concerned for methamphetamine or other stimulant use.  Symptoms seem most consistent with viral illness but will also check chest x-ray for evidence of pneumonia.  Labs are pending at this time, we will treat with IV Ativan for his muscle aches and hydrate with IV fluids.  Patient denies suicidal or homicidal ideation, does not appear actively psychotic at this time.  Lab results were remarkable for mildly elevated CK levels but no change in renal function or significant electrolyte abnormality noted.  He does have chronic transaminitis that is unchanged today.  COVID testing is negative and chest x-ray is unremarkable.  UDS did come back positive for amphetamines, which is likely contributing to his presentation.  Patient was resting comfortably for a time following dose of IV Ativan, now acting more appropriately out upon awakening.  He is appropriate for discharge home with outpatient follow-up, was counseled to return to the ED for new or worsening symptoms.  Patient agrees with plan.       FINAL CLINICAL IMPRESSION(S) / ED DIAGNOSES   Final diagnoses:  Viral bronchitis  Amphetamine use     Rx / DC Orders   ED Discharge Orders     None        Note:  This document was prepared using Dragon voice recognition software and may include unintentional dictation errors.   Chesley Noon, MD 05/12/23 2202

## 2023-05-13 ENCOUNTER — Other Ambulatory Visit: Payer: Self-pay

## 2023-05-13 ENCOUNTER — Emergency Department
Admission: EM | Admit: 2023-05-13 | Discharge: 2023-05-13 | Disposition: A | Discharge status: Home / Self Care | Payer: MEDICAID | Attending: Emergency Medicine | Admitting: Emergency Medicine

## 2023-05-13 ENCOUNTER — Encounter: Payer: Self-pay | Admitting: Emergency Medicine

## 2023-05-13 DIAGNOSIS — M7918 Myalgia, other site: Secondary | ICD-10-CM | POA: Diagnosis not present

## 2023-05-13 DIAGNOSIS — R0981 Nasal congestion: Secondary | ICD-10-CM | POA: Diagnosis present

## 2023-05-13 DIAGNOSIS — J069 Acute upper respiratory infection, unspecified: Secondary | ICD-10-CM | POA: Diagnosis not present

## 2023-05-13 DIAGNOSIS — F1590 Other stimulant use, unspecified, uncomplicated: Secondary | ICD-10-CM | POA: Diagnosis not present

## 2023-05-13 MED ORDER — IBUPROFEN 600 MG PO TABS
600.0000 mg | ORAL_TABLET | Freq: Once | ORAL | Status: AC
  Administered 2023-05-13: 600 mg via ORAL
  Filled 2023-05-13: qty 1

## 2023-05-13 MED ORDER — LORAZEPAM 2 MG PO TABS
2.0000 mg | ORAL_TABLET | Freq: Once | ORAL | Status: AC
  Administered 2023-05-13: 2 mg via ORAL
  Filled 2023-05-13: qty 1

## 2023-05-13 MED ORDER — ACETAMINOPHEN 500 MG PO TABS
1000.0000 mg | ORAL_TABLET | Freq: Once | ORAL | Status: AC
  Administered 2023-05-13: 1000 mg via ORAL
  Filled 2023-05-13: qty 2

## 2023-05-13 NOTE — Discharge Instructions (Signed)
Take acetaminophen 650 mg and ibuprofen 400 mg every 6 hours for pain.  Take with food.  Thank you for choosing us for your health care today!  Please see your primary doctor this week for a follow up appointment.   If you have any new, worsening, or unexpected symptoms call your doctor right away or come back to the emergency department for reevaluation.  It was my pleasure to care for you today.   Edla Para S. Tiesha Marich, MD  

## 2023-05-13 NOTE — ED Triage Notes (Signed)
Pt ambulatory to ED lobby coughing loudly and clearing his throat, eating an apple; pt restless, jerky movements; was seen and d/c earlier and st he is returning because "he didn't get any medicine"

## 2023-05-13 NOTE — ED Provider Notes (Signed)
[facility] Provider Note    Event Date/Time   First MD Initiated Contact with Patient 05/13/23 [phone]     (approximate)   History   Nasal Congestion   HPI  [name] is a 47 y.o. male   Past medical history of amphetamine use, marijuana use, anxiety, bipolar who comes back to the emergency department with ongoing symptoms that he was evaluated for yesterday-several days of myalgias, cough with productive sputum, nasal congestion, sore throat.  He tested positive for amphetamines and was hyperactive but improved with Ativan on his earlier evaluation yesterday, had a negative COVID test, had blood testing and a chest x-ray which was negative for evidence of bacterial pneumonia.  He states that his symptoms are unchanged.  He denies drug use.  He denies fever.  He denies chest pain.  He comes back today asking for medication to help with his aches and pains.   External Medical Documents Reviewed: Emergency department visit dated 05/12/2023 for cough congestion and myalgia      Physical Exam   Triage Vital Signs: ED Triage Vitals [05/13/23 0442]  Encounter Vitals Group     BP 109/72     Systolic BP Percentile      Diastolic BP Percentile      Pulse Rate 81     Resp 20     Temp 98.2 F (36.8 C)     Temp Source Oral     SpO2 99 %     Weight 158 lb 11.7 oz (72 kg)     Height 6\' 1"  (1.854 m)     Head Circumference      Peak Flow      Pain Score 10     Pain Loc      Pain Education      Exclude from Growth Chart     Most recent vital signs: Vitals:   05/13/23 0442  BP: 109/72  Pulse: 81  Resp: 20  Temp: 98.2 F (36.8 C)  SpO2: 99%    General: Awake, no distress.  CV:  Good peripheral perfusion.  Resp:  Normal effort.  Abd:  No distention.  Other:  Hyperactive, but able to be easily redirected and refocused on interview and physical exam.  Lungs clear without focality or wheezing.  Skin appears warm well-perfused, euvolemic  appearing.  Neck supple with full range of motion he appears nontoxic his abdomen is soft and nontender.  He coughs intermittently.   ED Results / Procedures / Treatments   Labs (all labs ordered are listed, but only abnormal results are displayed) Labs Reviewed - No data to display  PROCEDURES:  Critical Care performed: No  Procedures   MEDICATIONS ORDERED IN ED: Medications  acetaminophen (TYLENOL) tablet 1,000 mg (1,000 mg Oral Given 05/13/23 0455)  ibuprofen (ADVIL) tablet 600 mg (600 mg Oral Given 05/13/23 0455)  LORazepam (ATIVAN) tablet 2 mg (2 mg Oral Given 05/13/23 0455)     IMPRESSION / MDM / ASSESSMENT AND PLAN / ED COURSE  I reviewed the triage vital signs and the nursing notes.                                Patient's presentation is most consistent with acute presentation with potential threat to life or bodily function.  Differential diagnosis includes, but is not limited to, amphetamine use, viral URI, bacterial pneumonia, sepsis   The patient is on  the cardiac monitor to evaluate for evidence of arrhythmia and/or significant heart rate changes.  MDM:   Is a patient with ongoing symptoms of viral URI with extensive testing done earlier including CK, labs, COVID, chest x-ray which were largely unremarkable.  He appeared sympathomimetic with marked improvement with Ativan earlier, and tested positive for methamphetamines just yesterday afternoon he appears to be sympathomimetic today again but is alert oriented and cooperative.  He states that he does not drink alcohol and he does not appear to be in acute alcohol withdrawal with no autonomic instability, normal vital signs, and is requesting medications to help with his body aches and congestion.  I informed him that his viral illness will take time to resolve but will give Tylenol and ibuprofen to help symptomatically as well as a p.o. Ativan to help with his anxiety/hyperactivity now.  I considered more emergent  pathologies like sepsis, meningitis, rhabdomyolysis, but these are less likely given his benign appearing exam as above and extensive workup yesterday was largely unremarkable and unchanged symptoms.      FINAL CLINICAL IMPRESSION(S) / ED DIAGNOSES   Final diagnoses:  Viral URI with cough  Amphetamine use     Rx / DC Orders   ED Discharge Orders     None        Note:  This document was prepared using Dragon voice recognition software and may include unintentional dictation errors.    Pilar Jarvis, MD 05/13/23 [phone]

## 2023-05-20 NOTE — Congregational Nurse Program (Signed)
  Dept: [phone]   Congregational Nurse Program Note  Date of Encounter: 05/20/2023 Client to [facility] day center with complaints of a cough and nasal congestion. He reports having been seen at the [facility] ER, COVID negative. He also had an open blister to the inside of his left heel. Antibiotic ointment applied to the area and covered with a nonadherent dressing. Same treatment for the right heel. Client educated to drink more fluids, water available for all participants today. Client voiced understanding, but still wanted some kind of medication, RN unable to provide. He also requested assistance with obtaining his Medicaid number so he could go to Urgent care. Information given. 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 Yes    Student Assistance N/A    Location Patient Served  [facility]    Visit Setting with Client Organization    Patient Status Unhoused    Insurance Medicaid   [employer] plan, confirmed with [facility] [employer]   Insurance/Financial Assistance Referral N/A    Medication N/A    Medical Provider No    Screening Referrals Made N/A    Medical Referrals Made N/A    Medical Appointment Made N/A    Recently w/o PCP, now 1st time PCP visit completed due to CNs referral or appointment made N/A    Food Have Food Insecurities    Transportation N/A    Housing/Utilities No permanent housing    Interpersonal Safety N/A    Interventions Advocate/Support    Abnormal to Normal Screening Since Last CN Visit N/A    Screenings CN Performed Temperature    no fever   Sent Client to Lab for: N/A    Did client attend any of the following based off CNs referral or appointments made? N/A    ED Visit Averted N/A    Life-Saving Intervention Made N/A

## 2023-06-09 ENCOUNTER — Other Ambulatory Visit: Payer: Self-pay

## 2023-06-09 ENCOUNTER — Emergency Department
Admission: EM | Admit: 2023-06-09 | Discharge: 2023-06-09 | Disposition: A | Discharge status: Home / Self Care | Payer: MEDICAID | Attending: Emergency Medicine | Admitting: Emergency Medicine

## 2023-06-09 DIAGNOSIS — K5900 Constipation, unspecified: Secondary | ICD-10-CM | POA: Insufficient documentation

## 2023-06-09 DIAGNOSIS — F1190 Opioid use, unspecified, uncomplicated: Secondary | ICD-10-CM | POA: Diagnosis present

## 2023-06-09 DIAGNOSIS — F1193 Opioid use, unspecified with withdrawal: Secondary | ICD-10-CM | POA: Diagnosis not present

## 2023-06-09 LAB — CBC
HCT: 43.1 % (ref 39.0–52.0)
Hemoglobin: 14.3 g/dL (ref 13.0–17.0)
MCH: 28.5 pg (ref 26.0–34.0)
MCHC: 33.2 g/dL (ref 30.0–36.0)
MCV: 85.9 fL (ref 80.0–100.0)
Platelets: 287 10*3/uL (ref 150–400)
RBC: 5.02 MIL/uL (ref 4.22–5.81)
RDW: 13 % (ref 11.5–15.5)
WBC: 6.4 10*3/uL (ref 4.0–10.5)
nRBC: 0 % (ref 0.0–0.2)

## 2023-06-09 LAB — ETHANOL: Alcohol, Ethyl (B): <10 mg/dL (ref <10)

## 2023-06-09 MED ORDER — POLYETHYLENE GLYCOL 3350 17 G PO PACK: 17.0000 g | PACK | Freq: Every day | ORAL | 0 refills | Status: AC

## 2023-06-09 MED ORDER — QUETIAPINE FUMARATE 400 MG PO TABS: 400.0000 mg | ORAL_TABLET | Freq: Every day | ORAL | 0 refills | Status: AC

## 2023-06-09 MED ORDER — IBUPROFEN 600 MG PO TABS
600.0000 mg | ORAL_TABLET | Freq: Once | ORAL | Status: AC
  Administered 2023-06-09: 600 mg via ORAL
  Filled 2023-06-09: qty 1

## 2023-06-09 NOTE — ED Provider Notes (Signed)
   [name] Provider Note    Event Date/Time   First MD Initiated Contact with Patient 06/09/23 1122     (approximate)   History   detox   HPI  [name] is a 47 y.o. male who presents to the emergency department today with concerns for diffuse body pain.  He states that he is coming off of opioids.  Pain has been going on for the past couple of days.  In addition he has complaints of constipation.     Physical Exam   Triage Vital Signs: ED Triage Vitals  Encounter Vitals Group     BP 06/09/23 1100 125/83     Systolic BP Percentile --      Diastolic BP Percentile --      Pulse Rate 06/09/23 1100 99     Resp 06/09/23 1100 18     Temp 06/09/23 1100 97.9 F (36.6 C)     Temp Source 06/09/23 1100 Oral     SpO2 06/09/23 1100 96 %     Weight 06/09/23 1100 145 lb (65.8 kg)     Height 06/09/23 1100 6\' 1"  (1.854 m)     Head Circumference --      Peak Flow --      Pain Score 06/09/23 1133 4     Pain Loc --      Pain Education --      Exclude from Growth Chart --     Most recent vital signs: Vitals:   06/09/23 1100 06/09/23 1133  BP: 125/83 126/80  Pulse: 99 99  Resp: 18 19  Temp: 97.9 F (36.6 C) 98 F (36.7 C)  SpO2: 96% 98%   General: Awake, alert, oriented. CV:  Good peripheral perfusion. Regular rate and rhythm. Resp:  Normal effort. Lungs clear. Abd:  No distention.    ED Results / Procedures / Treatments   Labs (all labs ordered are listed, but only abnormal results are displayed) Labs Reviewed  CBC  URINE DRUG SCREEN, QUALITATIVE ([facility] ONLY)  ETHANOL     EKG  None   RADIOLOGY None  PROCEDURES:  Critical Care performed: No   MEDICATIONS ORDERED IN ED: Medications  ibuprofen (ADVIL) tablet 600 mg (600 mg Oral Given 06/09/23 1132)     IMPRESSION / MDM / ASSESSMENT AND PLAN / ED COURSE  I reviewed the triage vital signs and the nursing notes.                              Differential diagnosis  includes, but is not limited to, opioid withdrawal, viral illness  Patient's presentation is most consistent with acute presentation with potential threat to life or bodily function.   Patient presents to the emergency department today because of concerns for diffuse bodyaches in the setting of opioid withdrawal.  Blood work without concerning leukocytosis and patient is afebrile.  At this time do think patient symptoms likely are From coming off of opioids.  Discussed with patient.  Patient also requesting refill of Seroquel.  Will give patient prescription for Seroquel and MiraLAX.     FINAL CLINICAL IMPRESSION(S) / ED DIAGNOSES   Final diagnoses:  Opioid withdrawal (HCC)    Note:  This document was prepared using Dragon voice recognition software and may include unintentional dictation errors.    Phineas Semen, MD 06/09/23 [phone]

## 2023-06-09 NOTE — ED Triage Notes (Signed)
First Nurse Note:  Pt via [facility] from court, pt was in the bathroom and being loud, making odd noises. When someone went to check on him, they thought he was constipated. When EMS asked pt states he is withdrawing from meth. Pt c/o generalized body aches. Pt is A&Ox4 and NAD.

## 2023-06-09 NOTE — Discharge Instructions (Signed)
Please seek medical attention for any high fevers, chest pain, shortness of breath, change in behavior, persistent vomiting, bloody stool or any other new or concerning symptoms.  

## 2023-06-09 NOTE — ED Triage Notes (Signed)
See first nurse note, pt reports pain all over from withdrawal. States coming off meth and suboxone. Ambulatory to triage. Last used yesterday. Denies SI/HI

## 2023-06-10 ENCOUNTER — Emergency Department: Payer: MEDICAID

## 2023-06-10 ENCOUNTER — Emergency Department
Admission: EM | Admit: 2023-06-10 | Discharge: 2023-06-10 | Disposition: A | Discharge status: Home / Self Care | Payer: MEDICAID | Attending: Emergency Medicine | Admitting: Emergency Medicine

## 2023-06-10 ENCOUNTER — Other Ambulatory Visit: Payer: Self-pay

## 2023-06-10 DIAGNOSIS — F1193 Opioid use, unspecified with withdrawal: Secondary | ICD-10-CM

## 2023-06-10 DIAGNOSIS — R0602 Shortness of breath: Secondary | ICD-10-CM

## 2023-06-10 DIAGNOSIS — Z1152 Encounter for screening for COVID-19: Secondary | ICD-10-CM | POA: Diagnosis not present

## 2023-06-10 LAB — CBC WITH DIFFERENTIAL/PLATELET
Abs Immature Granulocytes: 0.02 10*3/uL (ref 0.00–0.07)
Basophils Absolute: 0 10*3/uL (ref 0.0–0.1)
Basophils Relative: 1 %
Eosinophils Absolute: 0.3 10*3/uL (ref 0.0–0.5)
Eosinophils Relative: 5 %
HCT: 44.6 % (ref 39.0–52.0)
Hemoglobin: 14.6 g/dL (ref 13.0–17.0)
Immature Granulocytes: 0 %
Lymphocytes Relative: 25 %
Lymphs Abs: 1.6 10*3/uL (ref 0.7–4.0)
MCH: 28.3 pg (ref 26.0–34.0)
MCHC: 32.7 g/dL (ref 30.0–36.0)
MCV: 86.4 fL (ref 80.0–100.0)
Monocytes Absolute: 0.5 10*3/uL (ref 0.1–1.0)
Monocytes Relative: 7 %
Neutro Abs: 3.9 10*3/uL (ref 1.7–7.7)
Neutrophils Relative %: 62 %
Platelets: 265 10*3/uL (ref 150–400)
RBC: 5.16 MIL/uL (ref 4.22–5.81)
RDW: 13.2 % (ref 11.5–15.5)
WBC: 6.3 10*3/uL (ref 4.0–10.5)
nRBC: 0 % (ref 0.0–0.2)

## 2023-06-10 LAB — BASIC METABOLIC PANEL
Anion gap: 7 (ref 5–15)
BUN: 15 mg/dL (ref 6–20)
CO2: 25 mmol/L (ref 22–32)
Calcium: 8.6 mg/dL — ABNORMAL LOW (ref 8.9–10.3)
Chloride: 105 mmol/L (ref 98–111)
Creatinine, Ser: 0.69 mg/dL (ref 0.61–1.24)
GFR, Estimated: >60 mL/min (ref >60)
Glucose, Bld: 93 mg/dL (ref 70–99)
Potassium: 4 mmol/L (ref 3.5–5.1)
Sodium: 137 mmol/L (ref 135–145)

## 2023-06-10 LAB — SARS CORONAVIRUS 2 BY RT PCR: SARS Coronavirus 2 by RT PCR: NEGATIVE

## 2023-06-10 MED ORDER — ACETAMINOPHEN 500 MG PO TABS
1000.0000 mg | ORAL_TABLET | Freq: Once | ORAL | Status: AC
  Administered 2023-06-10: 1000 mg via ORAL
  Filled 2023-06-10: qty 2

## 2023-06-10 MED ORDER — LACTATED RINGERS IV BOLUS
1000.0000 mL | Freq: Once | INTRAVENOUS | Status: AC
  Administered 2023-06-10: 1000 mL via INTRAVENOUS

## 2023-06-10 NOTE — Discharge Instructions (Addendum)
You were seen in the emergency department today for your shortness of breath.  You are negative for COVID today via still suspect symptoms are likely a viral illness or potentially withdrawal symptoms related to recent discontinuation of heroin or methamphetamine.  Please follow-up with your PCP as needed.  Please return to emergency department if symptoms worsen.

## 2023-06-10 NOTE — ED Triage Notes (Signed)
Pt to ED via [facility] from side of the road. Upon EMS' arrival on scene, Pt had a bottle of liquor and  a blanket from fire department. PT stated he had not consumed and ETHO today.  PT's complaint was SOB with body aches.   EMS vitals:HR 88, O2 95, BP 145/99

## 2023-06-10 NOTE — ED Provider Notes (Signed)
[facility] Provider Note    Event Date/Time   First MD Initiated Contact with Patient 06/10/23 [phone]     (approximate)   History   Shortness of Breath   HPI [name] is a 47 y.o. male with history of polysubstance use disorder presenting today for shortness of breath.  Patient states he was out walking on the street when he got short of breath and had to sit down.  EMS picked him up from this position.  Notes body aches, shortness of breath, and constipation.  Reports he has been off opioids, methamphetamines for the past 3 days.  Did have a glass of wine this morning.  No recent tobacco or marijuana use.     Physical Exam   Triage Vital Signs: ED Triage Vitals  Encounter Vitals Group     BP 06/10/23 0906 (!) 137/96     Systolic BP Percentile --      Diastolic BP Percentile --      Pulse Rate 06/10/23 0906 66     Resp 06/10/23 0906 17     Temp 06/10/23 0906 97.8 F (36.6 C)     Temp src --      SpO2 06/10/23 0906 94 %     Weight 06/10/23 0903 145 lb 1 oz (65.8 kg)     Height 06/10/23 0903 6\' 1"  (1.854 m)     Head Circumference --      Peak Flow --      Pain Score 06/10/23 0906 8     Pain Loc --      Pain Education --      Exclude from Growth Chart --     Most recent vital signs: Vitals:   06/10/23 0906  BP: (!) 137/96  Pulse: 66  Resp: 17  Temp: 97.8 F (36.6 C)  SpO2: 94%   Physical Exam: I have reviewed the vital signs and nursing notes. General: Awake, alert, no acute distress.  Nontoxic appearing. Head:  Atraumatic, normocephalic.   ENT:  EOM intact, PERRL. Oral mucosa is pink and moist with no lesions. Neck: Neck is supple with full range of motion, No meningeal signs. Cardiovascular:  RRR, No murmurs. Peripheral pulses palpable and equal bilaterally. Respiratory:  Symmetrical chest wall expansion.  No rhonchi, rales, or wheezes.  Good air movement throughout.  No use of accessory muscles.   Musculoskeletal:  No  cyanosis or edema. Moving extremities with full ROM Abdomen:  Soft, nontender, nondistended. Neuro:  GCS 15, moving all four extremities, interacting appropriately. Speech clear. Psych:  Calm, appropriate.   Skin:  Warm, dry, no rash.    ED Results / Procedures / Treatments   Labs (all labs ordered are listed, but only abnormal results are displayed) Labs Reviewed  BASIC METABOLIC PANEL - Abnormal; Notable for the following components:      Result Value   Calcium 8.6 (*)    All other components within normal limits  SARS CORONAVIRUS 2 BY RT PCR  CBC WITH DIFFERENTIAL/PLATELET     EKG    RADIOLOGY See ED course for my independent interpretation   PROCEDURES:  Critical Care performed: No  Procedures   MEDICATIONS ORDERED IN ED: Medications  lactated ringers bolus 1,000 mL (1,000 mLs Intravenous New Bag/Given 06/10/23 0941)  acetaminophen (TYLENOL) tablet 1,000 mg (1,000 mg Oral Given 06/10/23 0938)     IMPRESSION / MDM / ASSESSMENT AND PLAN / ED COURSE  I reviewed the triage vital signs and  the nursing notes.                              Differential diagnosis includes, but is not limited to, viral URI, opioid withdrawal, methamphetamine withdrawal.  Patient's presentation is most consistent with acute illness / injury with system symptoms.  Patient is a 47 year old male presenting today for shortness of breath and cough in the setting of withdrawal from heroin and methamphetamines.  Vital signs stable.  Laboratory workup unremarkable.  Chest x-ray shows no evidence of pneumonia.  Patient negative for COVID.  Suspect symptoms likely related to ongoing withdrawal from heroin and methamphetamines.  Patient otherwise stable from an exam and vital sign standpoint for discharge.  Patient given strict return precautions.  He was also given outpatient resources for help with withdrawal.  The patient is on the cardiac monitor to evaluate for evidence of arrhythmia and/or  significant heart rate changes. Clinical Course as of 06/10/23 1053  Thu Jun 10, 2023  0954 DG Chest Portable 1 View No acute pathology per my interpretation [DW]  0958 My EKG interpretation: Rate of 65, normal sinus rhythm, normal axis.  No acute ST elevations or depressions [DW]    Clinical Course User Index [DW] Janith Lima, MD     FINAL CLINICAL IMPRESSION(S) / ED DIAGNOSES   Final diagnoses:  SOB (shortness of breath)  Opioid withdrawal (HCC)     Rx / DC Orders   ED Discharge Orders     None        Note:  This document was prepared using Dragon voice recognition software and may include unintentional dictation errors.   Janith Lima, MD 06/10/23 1054

## 2023-06-14 NOTE — Group Note (Deleted)

## 2023-06-18 ENCOUNTER — Encounter: Payer: Self-pay | Admitting: Emergency Medicine

## 2023-06-18 ENCOUNTER — Other Ambulatory Visit: Payer: Self-pay

## 2023-06-18 ENCOUNTER — Emergency Department
Admission: EM | Admit: 2023-06-18 | Discharge: 2023-06-18 | Disposition: A | Discharge status: Home / Self Care | Payer: MEDICAID | Attending: Emergency Medicine | Admitting: Emergency Medicine

## 2023-06-18 DIAGNOSIS — K047 Periapical abscess without sinus: Secondary | ICD-10-CM | POA: Diagnosis present

## 2023-06-18 MED ORDER — CLINDAMYCIN HCL 150 MG PO CAPS: 300.0000 mg | ORAL_CAPSULE | Freq: Three times a day (TID) | ORAL | 0 refills | Status: DC

## 2023-06-18 MED ORDER — SODIUM CHLORIDE 0.9 % IV BOLUS
1000.0000 mL | Freq: Once | INTRAVENOUS | Status: AC
  Administered 2023-06-18: 1000 mL via INTRAVENOUS

## 2023-06-18 MED ORDER — CLINDAMYCIN PHOSPHATE 600 MG/50ML IV SOLN
600.0000 mg | Freq: Once | INTRAVENOUS | Status: AC
  Administered 2023-06-18: 600 mg via INTRAVENOUS
  Filled 2023-06-18: qty 50

## 2023-06-18 MED ORDER — MORPHINE SULFATE (PF) 2 MG/ML IV SOLN
2.0000 mg | Freq: Once | INTRAVENOUS | Status: AC
  Administered 2023-06-18: 2 mg via INTRAVENOUS
  Filled 2023-06-18: qty 1

## 2023-06-18 MED ORDER — IBUPROFEN 600 MG PO TABS
600.0000 mg | ORAL_TABLET | Freq: Four times a day (QID) | ORAL | 0 refills | Status: AC | PRN
Start: 2023-06-18 — End: ?

## 2023-06-18 MED ORDER — ONDANSETRON HCL 4 MG/2ML IJ SOLN
4.0000 mg | Freq: Once | INTRAMUSCULAR | Status: AC
  Administered 2023-06-18: 4 mg via INTRAVENOUS
  Filled 2023-06-18: qty 2

## 2023-06-18 MED ORDER — MORPHINE SULFATE (PF) 4 MG/ML IV SOLN: 4.0000 mg | Freq: Once | INTRAVENOUS | Status: DC

## 2023-06-18 NOTE — ED Provider Notes (Signed)
[facility] Provider Note    Event Date/Time   First MD Initiated Contact with Patient 06/18/23 0930     (approximate)   History   Dental Pain   HPI  [name] is a 47 y.o. 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  No fever or chills.  States area is very painful and swollen.      Physical Exam   Triage Vital Signs: ED Triage Vitals  Encounter Vitals Group     BP 06/18/23 0902 (!) 149/95     Systolic BP Percentile --      Diastolic BP Percentile --      Pulse Rate 06/18/23 0902 89     Resp 06/18/23 0902 18     Temp 06/18/23 0902 97.8 F (36.6 C)     Temp Source 06/18/23 0902 Oral     SpO2 06/18/23 0902 97 %     Weight 06/18/23 0901 145 lb 1 oz (65.8 kg)     Height 06/18/23 0901 6\' 1"  (1.854 m)     Head Circumference --      Peak Flow --      Pain Score 06/18/23 0901 9     Pain Loc --      Pain Education --      Exclude from Growth Chart --     Most recent vital signs: Vitals:   06/18/23 1000 06/18/23 1030  BP: (!) 157/91 (!) 147/95  Pulse: 88 90  Resp:    Temp:    SpO2: 100% 100%     General: Awake, no distress.   CV:  Good peripheral perfusion. regular rate and  rhythm Resp:  Normal effort. Lungs cta Abd:  No distention.   Other:  Right sided facial swelling, extends from the right upper lip into the orbital area of the right eye.  Poor dentition noted.  Several fractured teeth along with meth mouth.   ED Results / Procedures / Treatments   Labs (all labs ordered are listed, but only abnormal results are displayed) Labs Reviewed - No data to display   EKG     RADIOLOGY     PROCEDURES:   Procedures   MEDICATIONS ORDERED IN ED: Medications  sodium chloride 0.9 % bolus 1,000 mL (0 mLs Intravenous Stopped 06/18/23 1042)  ondansetron (ZOFRAN) injection 4  mg (4 mg Intravenous Given 06/18/23 1007)  clindamycin (CLEOCIN) IVPB 600 mg (0 mg Intravenous Stopped 06/18/23 1042)  morphine (PF) 2 MG/ML injection 2 mg (2 mg Intravenous Given 06/18/23 1010)     IMPRESSION / MDM / ASSESSMENT AND PLAN / ED COURSE  I reviewed the triage vital signs and the nursing notes.                              Differential diagnosis includes, but is not limited to, dental abscess, cellulitis, facial abscess  Patient's presentation is most consistent with acute illness / injury with system symptoms.   Patient's exam is consistent with severe dental abscess.  Will give him 2 mg of morphine, clindamycin 600 mg IV and plan is to discharge with medications   Patient tolerated medication well.  He was given a prescription for clindamycin and ibuprofen.  Patient became very argumentative with the nursing staff about being discharged.  He is homeless.  I had  security come to escort him out.  He was in stable condition at this time   FINAL CLINICAL IMPRESSION(S) / ED DIAGNOSES   Final diagnoses:  Dental abscess     Rx / DC Orders   ED Discharge Orders          Ordered    clindamycin (CLEOCIN) 150 MG capsule  3 times daily        06/18/23 1043    ibuprofen (ADVIL) 600 MG tablet  Every 6 hours PRN        06/18/23 1043             Note:  This document was prepared using Dragon voice recognition software and may include unintentional dictation errors.    Faythe Ghee, PA-C 06/18/23 1144    Corena Herter, MD 06/18/23 [phone]

## 2023-06-18 NOTE — ED Notes (Signed)
Patient is constantly twitching, moving all extremities, even when the patient appears to be dozing. Patient is lying on stretcher with eyes closed, answers questions appropriately, though at times has garbled speech. Patient has periods of swearing at this writer when placing the IV, controlled his swearing when asked. Clothing appears wet. Warm blanket given for comfort.

## 2023-06-18 NOTE — ED Notes (Signed)
Ps yelling  States he is not ready to leave States he is still in pain  wants something else  This nurse explained that his treatment is finished  Security at bedside

## 2023-06-18 NOTE — ED Triage Notes (Signed)
Presents via EMS with some dental pain  states pain started about 1 years ago   but developed swelling and increased pain this am

## 2023-06-19 ENCOUNTER — Emergency Department
Admission: EM | Admit: 2023-06-19 | Discharge: 2023-06-19 | Disposition: A | Discharge status: Home / Self Care | Payer: MEDICAID | Attending: Student in an Organized Health Care Education/Training Program | Admitting: Student in an Organized Health Care Education/Training Program

## 2023-06-19 ENCOUNTER — Other Ambulatory Visit: Payer: Self-pay

## 2023-06-19 DIAGNOSIS — K047 Periapical abscess without sinus: Secondary | ICD-10-CM | POA: Diagnosis not present

## 2023-06-19 DIAGNOSIS — R220 Localized swelling, mass and lump, head: Secondary | ICD-10-CM | POA: Diagnosis present

## 2023-06-19 MED ORDER — IBUPROFEN 800 MG PO TABS
800.0000 mg | ORAL_TABLET | Freq: Once | ORAL | Status: AC
  Administered 2023-06-19: 800 mg via ORAL
  Filled 2023-06-19: qty 1

## 2023-06-19 MED ORDER — LIDOCAINE VISCOUS HCL 2 % MT SOLN
15.0000 mL | Freq: Once | OROMUCOSAL | Status: AC
  Administered 2023-06-19: 15 mL via OROMUCOSAL
  Filled 2023-06-19: qty 15

## 2023-06-19 MED ORDER — LIDOCAINE-EPINEPHRINE 2 %-1:100000 IJ SOLN
1.7000 mL | Freq: Once | INTRAMUSCULAR | Status: AC
  Administered 2023-06-19: 1.7 mL
  Filled 2023-06-19: qty 1.7

## 2023-06-19 MED ORDER — CLINDAMYCIN HCL 150 MG PO CAPS
300.0000 mg | ORAL_CAPSULE | Freq: Three times a day (TID) | ORAL | 0 refills | Status: AC
  Filled 2023-06-19 – 2023-06-21 (×3): qty 42, 7d supply, fill #0

## 2023-06-19 MED ORDER — CLINDAMYCIN HCL 150 MG PO CAPS
300.0000 mg | ORAL_CAPSULE | Freq: Three times a day (TID) | ORAL | Status: DC
  Filled 2023-06-19 (×21): qty 2

## 2023-06-19 MED ORDER — CLINDAMYCIN HCL 150 MG PO CAPS: 300.0000 mg | ORAL_CAPSULE | Freq: Once | ORAL | Status: DC

## 2023-06-19 MED ORDER — CLINDAMYCIN HCL 300 MG PO CAPS
300.0000 mg | ORAL_CAPSULE | Freq: Three times a day (TID) | ORAL | 0 refills | Status: AC
  Filled 2023-06-19: qty 30, 10d supply, fill #0

## 2023-06-19 NOTE — ED Provider Notes (Signed)
[facility] Emergency Department Provider Note     Event Date/Time   First MD Initiated Contact with Patient 06/19/23 1348     (approximate)   History   Facial Swelling   HPI  [name] is a 47 y.o. male with a history of drug use presents to the ED with complaint of right-sided facial swelling x 2 days due to a dental abscess.  Patient endorses poor dental hygiene.  Patient was seen yesterday and was given IV clindamycin.  He was unable to fill his prescription due to financial insecurity yesterday.  Denies fever and chills.  Patient reports unchanged pain.    Physical Exam   Triage Vital Signs: ED Triage Vitals [06/19/23 1133]  Encounter Vitals Group     BP (!) 153/99     Systolic BP Percentile      Diastolic BP Percentile      Pulse Rate 94     Resp 16     Temp 98 F (36.7 C)     Temp Source Oral     SpO2 96 %     Weight      Height      Head Circumference      Peak Flow      Pain Score 9     Pain Loc      Pain Education      Exclude from Growth Chart     Most recent vital signs: Vitals:   06/19/23 1133  BP: (!) 153/99  Pulse: 94  Resp: 16  Temp: 98 F (36.7 C)  SpO2: 96%    General Awake, no distress.  Nontoxic. HEENT NCAT. PERRL. EOMI without pain. No rhinorrhea. Mucous membranes are moist.  CV:  Good peripheral perfusion.  RESP:  Normal effort.  ABD:  No distention.  Other:  Poor overall dentition. Multiple missing and fracture teeth. Severe tenderness of upper right gum. Palpable abscess over cuspid and lateral incisor.  Moderate edema and erythema over right eye. Closed shut.    ED Results / Procedures / Treatments   Labs (all labs ordered are listed, but only abnormal results are displayed) Labs Reviewed - No data to display  No results found.  PROCEDURES:  Critical Care performed: No  ..Incision and Drainage  Date/Time: 06/19/2023 11:40 PM  Performed by: Willy Eddy, MD Authorized by:  Kern Reap A, PA-C   Consent:    Consent obtained:  Verbal   Consent given by:  Patient   Risks discussed:  Bleeding, incomplete drainage, pain and infection Location:    Type:  Abscess   Location:  Mouth   Mouth location:  Alveolar process Pre-procedure details:    Skin preparation:  Antiseptic wash Sedation:    Sedation type:  None Anesthesia:    Anesthesia method:  Topical application and local infiltration   Topical anesthetic:  LET   Local anesthetic:  Lidocaine 2% WITH epi Procedure type:    Complexity:  Simple Procedure details:    Incision types:  Stab incision   Wound management:  Irrigated with saline   Drainage:  Bloody   Drainage amount:  Scant   Wound treatment:  Wound left open   Packing materials:  None Post-procedure details:    Procedure completion:  Tolerated   MEDICATIONS ORDERED IN ED: Medications  clindamycin (CLEOCIN) capsule 300 mg (has no administration in time range)  ibuprofen (ADVIL) tablet 800 mg (has no administration in time range)  lidocaine (XYLOCAINE) 2 %  viscous mouth solution 15 mL (15 mLs Mouth/Throat Given 06/19/23 1441)  lidocaine-EPINEPHrine (XYLOCAINE W/EPI) 2 %-1:100000 (with pres) injection 1.7 mL (1.7 mLs Infiltration Given 06/19/23 1442)   IMPRESSION / MDM / ASSESSMENT AND PLAN / ED COURSE  I reviewed the triage vital signs and the nursing notes.                             Clinical Course as of 06/19/23 1600  Sat Jun 19, 2023  1510 Spoke with pharmacy to obtain antibiotics for patient due to financial insecurity. Clindamycin 300mg . Pharmacy will tube down enough for the weekend. Patient will need to pick up from [facility] pharmacy on Monday.  [MH]  1538 Pharmacy is coming to patient room to speak with patient. [MH]    Clinical Course User Index [MH] Kern Reap A, PA-C   47 y.o. male presents to the emergency department for evaluation and treatment of dental pain. See HPI for further details.   Differential diagnosis  includes, but is not limited to dental abscess, dental pain due to cavities, cellulitis.  Patient's presentation is most consistent with acute complicated illness / injury requiring diagnostic workup.  Please see procedure note.  Patient tolerated well.  Patient given clindamycin and ibuprofen in ED.  Pharmacy came down to speak with patient and provided 5 dose of clindamycin and patient is encouraged and instructed to go to [facility] pharmacy on Monday to get remaining of antibiotics.  Patient is a stable discharge for discharge home.  Dental offices given to patient for follow-up. Patient is given ED precautions to return to the ED for any worsening or new symptoms. Patient verbalizes understanding. All questions and concerns were addressed during ED visit.     FINAL CLINICAL IMPRESSION(S) / ED DIAGNOSES   Final diagnoses:  Dental abscess  Facial swelling   Rx / DC Orders   ED Discharge Orders          Ordered    clindamycin (CLEOCIN) 300 MG capsule  3 times daily        06/19/23 1505    clindamycin (CLEOCIN) 150 MG capsule  3 times daily        06/19/23 1505            Note:  This document was prepared using Dragon voice recognition software and may include unintentional dictation errors.    Romeo Apple, Hommer Cunliffe A, PA-C 06/19/23 2342    Willy Eddy, MD 06/23/23 [phone]

## 2023-06-19 NOTE — ED Notes (Signed)
Pt A&O x4, no obvious distress noted, respirations regular/unlabored. Pt verbalizes understanding of discharge instructions. Pt able to ambulate from ED independently.   

## 2023-06-19 NOTE — ED Triage Notes (Signed)
Pt arrives via [facility] for R sided facial swelling and pain for two days. Pt states he was seen yesterday and diagnosed with dental abscess on R upper side, but unable to fill prescriptions d/t financial insecurity and inability to drive to pharmacy. Denies SOB.

## 2023-06-19 NOTE — Discharge Instructions (Addendum)
Please take the antibiotics provided for you by the pharmacist.  Pick up remaining prescription from [facility] to see on Monday.  Go to the [facility] and they will assist you with obtaining this medication.  Eat soft foods such as ice cream, popsicles to help with swelling.  Ibuprofen for pain as needed.  Be sure to gargle your mouth with warm salt water.  Follow-up with a dentist that has been provided for you.

## 2023-06-20 ENCOUNTER — Other Ambulatory Visit: Payer: Self-pay

## 2023-06-21 ENCOUNTER — Other Ambulatory Visit: Payer: Self-pay
